# Patient Record
Sex: Male | Born: 1950 | Race: White | Hispanic: No | Marital: Married | Smoking: Former smoker
Health system: Southern US, Community
[De-identification: ages and names within clinical notes are randomized; demographics above are authoritative.]

## PROBLEM LIST (undated history)

## (undated) DIAGNOSIS — Z9889 Other specified postprocedural states: Secondary | ICD-10-CM

## (undated) DIAGNOSIS — E785 Hyperlipidemia, unspecified: Secondary | ICD-10-CM

## (undated) DIAGNOSIS — I10 Essential (primary) hypertension: Secondary | ICD-10-CM

## (undated) DIAGNOSIS — Z87442 Personal history of urinary calculi: Secondary | ICD-10-CM

## (undated) DIAGNOSIS — C439 Malignant melanoma of skin, unspecified: Secondary | ICD-10-CM

## (undated) DIAGNOSIS — J939 Pneumothorax, unspecified: Secondary | ICD-10-CM

## (undated) HISTORY — DX: Hyperlipidemia, unspecified: E78.5

## (undated) HISTORY — DX: Essential (primary) hypertension: I10

## (undated) HISTORY — DX: Pneumothorax, unspecified: J93.9

## (undated) HISTORY — PX: WISDOM TOOTH EXTRACTION: SHX21

---

## 1978-11-27 DIAGNOSIS — J939 Pneumothorax, unspecified: Secondary | ICD-10-CM

## 1978-11-27 HISTORY — DX: Pneumothorax, unspecified: J93.9

## 1979-10-28 DIAGNOSIS — Z9889 Other specified postprocedural states: Secondary | ICD-10-CM

## 1979-10-28 HISTORY — DX: Other specified postprocedural states: Z98.890

## 1998-06-22 ENCOUNTER — Encounter: Admission: RE | Admit: 1998-06-22 | Discharge: 1998-09-20 | Payer: Self-pay | Admitting: Family Medicine

## 2000-09-21 ENCOUNTER — Encounter: Payer: Self-pay | Admitting: Internal Medicine

## 2000-09-21 ENCOUNTER — Encounter: Payer: Self-pay | Admitting: Emergency Medicine

## 2000-09-21 ENCOUNTER — Emergency Department (HOSPITAL_COMMUNITY): Admission: EM | Admit: 2000-09-21 | Discharge: 2000-09-21 | Payer: Self-pay | Admitting: Internal Medicine

## 2017-08-24 DIAGNOSIS — R69 Illness, unspecified: Secondary | ICD-10-CM | POA: Diagnosis not present

## 2017-12-26 DIAGNOSIS — Z0100 Encounter for examination of eyes and vision without abnormal findings: Secondary | ICD-10-CM | POA: Diagnosis not present

## 2017-12-26 DIAGNOSIS — Z0101 Encounter for examination of eyes and vision with abnormal findings: Secondary | ICD-10-CM | POA: Diagnosis not present

## 2018-01-16 DIAGNOSIS — D220 Melanocytic nevi of lip: Secondary | ICD-10-CM | POA: Diagnosis not present

## 2018-01-16 DIAGNOSIS — D1801 Hemangioma of skin and subcutaneous tissue: Secondary | ICD-10-CM | POA: Diagnosis not present

## 2018-01-16 DIAGNOSIS — D485 Neoplasm of uncertain behavior of skin: Secondary | ICD-10-CM | POA: Diagnosis not present

## 2018-01-16 DIAGNOSIS — C4359 Malignant melanoma of other part of trunk: Secondary | ICD-10-CM | POA: Diagnosis not present

## 2018-01-16 DIAGNOSIS — L570 Actinic keratosis: Secondary | ICD-10-CM | POA: Diagnosis not present

## 2018-01-16 DIAGNOSIS — L821 Other seborrheic keratosis: Secondary | ICD-10-CM | POA: Diagnosis not present

## 2018-01-16 DIAGNOSIS — D225 Melanocytic nevi of trunk: Secondary | ICD-10-CM | POA: Diagnosis not present

## 2018-01-25 DIAGNOSIS — C439 Malignant melanoma of skin, unspecified: Secondary | ICD-10-CM | POA: Insufficient documentation

## 2018-01-25 HISTORY — DX: Malignant melanoma of skin, unspecified: C43.9

## 2018-02-06 ENCOUNTER — Ambulatory Visit: Payer: Self-pay | Admitting: General Surgery

## 2018-02-06 ENCOUNTER — Encounter (HOSPITAL_COMMUNITY): Payer: Self-pay | Admitting: *Deleted

## 2018-02-06 ENCOUNTER — Other Ambulatory Visit: Payer: Self-pay

## 2018-02-06 DIAGNOSIS — C4359 Malignant melanoma of other part of trunk: Secondary | ICD-10-CM | POA: Diagnosis not present

## 2018-02-06 NOTE — Progress Notes (Signed)
Pt denies SOB, chest pain, and being under the care of a cardiologist. Pt denies having a stress test, echo and cardiac cath. Pt denies having an EKG and chest x ray within the last year. Pt denies recent labs. Pt made aware to stop taking  Aspirin, vitamins, fish oil and herbal medications. Do not take any NSAIDs ie: Ibuprofen, Advil, Naproxen (Aleve), Motrin, BC and Goody Powder. Pt verbalized understanding of all pre-op instructions. 

## 2018-02-07 NOTE — Anesthesia Preprocedure Evaluation (Signed)
Anesthesia Evaluation  Patient identified by MRN, date of birth, ID band Patient awake    Reviewed: Allergy & Precautions, NPO status , Patient's Chart, lab work & pertinent test results  Airway Mallampati: II  TM Distance: >3 FB Neck ROM: Full    Dental no notable dental hx.    Pulmonary neg pulmonary ROS, former smoker,    Pulmonary exam normal breath sounds clear to auscultation       Cardiovascular negative cardio ROS Normal cardiovascular exam Rhythm:Regular Rate:Normal     Neuro/Psych negative neurological ROS  negative psych ROS   GI/Hepatic negative GI ROS, Neg liver ROS,   Endo/Other  negative endocrine ROS  Renal/GU negative Renal ROS  negative genitourinary   Musculoskeletal negative musculoskeletal ROS (+)   Abdominal   Peds negative pediatric ROS (+)  Hematology negative hematology ROS (+)   Anesthesia Other Findings   Reproductive/Obstetrics negative OB ROS                             Anesthesia Physical Anesthesia Plan  ASA: II  Anesthesia Plan: General   Post-op Pain Management: GA combined w/ Regional for post-op pain   Induction:   PONV Risk Score and Plan: 2  Airway Management Planned: Oral ETT and LMA  Additional Equipment:   Intra-op Plan:   Post-operative Plan: Extubation in OR  Informed Consent: I have reviewed the patients History and Physical, chart, labs and discussed the procedure including the risks, benefits and alternatives for the proposed anesthesia with the patient or authorized representative who has indicated his/her understanding and acceptance.     Plan Discussed with: Anesthesiologist and CRNA  Anesthesia Plan Comments: (Discussed both nerve block for pain relief post-op and GA; including NV, sore throat, dental injury, and pulmonary complications)        Anesthesia Quick Evaluation

## 2018-02-08 ENCOUNTER — Encounter (HOSPITAL_COMMUNITY): Payer: Self-pay | Admitting: *Deleted

## 2018-02-08 ENCOUNTER — Ambulatory Visit (HOSPITAL_COMMUNITY): Payer: Medicare HMO

## 2018-02-08 ENCOUNTER — Ambulatory Visit (HOSPITAL_COMMUNITY): Payer: Medicare HMO | Admitting: Anesthesiology

## 2018-02-08 ENCOUNTER — Encounter (HOSPITAL_COMMUNITY)
Admission: RE | Disposition: A | Discharge status: Home / Self Care | Payer: Self-pay | Source: Ambulatory Visit | Attending: General Surgery

## 2018-02-08 ENCOUNTER — Ambulatory Visit (HOSPITAL_COMMUNITY)
Admission: RE | Admit: 2018-02-08 | Discharge: 2018-02-08 | Disposition: A | Discharge status: Home / Self Care | Payer: Medicare HMO | Source: Ambulatory Visit | Attending: General Surgery | Admitting: General Surgery

## 2018-02-08 ENCOUNTER — Other Ambulatory Visit: Payer: Self-pay

## 2018-02-08 DIAGNOSIS — C4359 Malignant melanoma of other part of trunk: Secondary | ICD-10-CM | POA: Insufficient documentation

## 2018-02-08 DIAGNOSIS — G8918 Other acute postprocedural pain: Secondary | ICD-10-CM | POA: Diagnosis not present

## 2018-02-08 DIAGNOSIS — Z87891 Personal history of nicotine dependence: Secondary | ICD-10-CM | POA: Insufficient documentation

## 2018-02-08 HISTORY — DX: Personal history of urinary calculi: Z87.442

## 2018-02-08 HISTORY — DX: Other specified postprocedural states: Z98.890

## 2018-02-08 HISTORY — PX: MELANOMA EXCISION WITH SENTINEL LYMPH NODE BIOPSY: SHX5267

## 2018-02-08 HISTORY — DX: Malignant melanoma of skin, unspecified: C43.9

## 2018-02-08 LAB — CBC
HCT: 49.1 % (ref 39.0–52.0)
Hemoglobin: 16.9 g/dL (ref 13.0–17.0)
MCH: 30.6 pg (ref 26.0–34.0)
MCHC: 34.4 g/dL (ref 30.0–36.0)
MCV: 88.9 fL (ref 78.0–100.0)
PLATELETS: 190 10*3/uL (ref 150–400)
RBC: 5.52 MIL/uL (ref 4.22–5.81)
RDW: 13.9 % (ref 11.5–15.5)
WBC: 6.3 10*3/uL (ref 4.0–10.5)

## 2018-02-08 SURGERY — MELANOMA EXCISION WITH SENTINEL LYMPH NODE BIOPSY
Anesthesia: General | Site: Breast | Laterality: Left

## 2018-02-08 MED ORDER — MIDAZOLAM HCL 2 MG/2ML IJ SOLN
INTRAMUSCULAR | Status: AC
  Administered 2018-02-08: 2 mg
  Filled 2018-02-08: qty 2

## 2018-02-08 MED ORDER — OXYCODONE HCL 5 MG/5ML PO SOLN: 5.0000 mg | Freq: Once | ORAL | Status: DC | PRN

## 2018-02-08 MED ORDER — CHLORHEXIDINE GLUCONATE CLOTH 2 % EX PADS: 6.0000 | MEDICATED_PAD | Freq: Once | CUTANEOUS | Status: DC

## 2018-02-08 MED ORDER — PROPOFOL 10 MG/ML IV BOLUS
INTRAVENOUS | Status: AC
  Filled 2018-02-08: qty 20

## 2018-02-08 MED ORDER — TECHNETIUM TC 99M SULFUR COLLOID FILTERED
0.5000 | Freq: Once | INTRAVENOUS | Status: AC | PRN
  Administered 2018-02-08: 0.5 via INTRADERMAL

## 2018-02-08 MED ORDER — MIDAZOLAM HCL 2 MG/2ML IJ SOLN
2.0000 mg | Freq: Once | INTRAMUSCULAR | Status: DC
  Filled 2018-02-08: qty 2

## 2018-02-08 MED ORDER — PHENYLEPHRINE 40 MCG/ML (10ML) SYRINGE FOR IV PUSH (FOR BLOOD PRESSURE SUPPORT)
PREFILLED_SYRINGE | INTRAVENOUS | Status: DC | PRN
  Administered 2018-02-08 (×9): 80 ug via INTRAVENOUS

## 2018-02-08 MED ORDER — SODIUM CHLORIDE 0.9 % IJ SOLN
INTRAMUSCULAR | Status: AC
  Filled 2018-02-08: qty 10

## 2018-02-08 MED ORDER — ONDANSETRON HCL 4 MG/2ML IJ SOLN: 4.0000 mg | Freq: Once | INTRAMUSCULAR | Status: DC | PRN

## 2018-02-08 MED ORDER — FENTANYL CITRATE (PF) 250 MCG/5ML IJ SOLN
INTRAMUSCULAR | Status: AC
  Filled 2018-02-08: qty 5

## 2018-02-08 MED ORDER — OXYCODONE HCL 5 MG PO TABS: 5.0000 mg | ORAL_TABLET | Freq: Four times a day (QID) | ORAL | 0 refills | Status: DC | PRN

## 2018-02-08 MED ORDER — SODIUM CHLORIDE 0.9 % IJ SOLN
INTRAMUSCULAR | Status: DC | PRN
  Administered 2018-02-08: 3 mL via INTRAVENOUS

## 2018-02-08 MED ORDER — FENTANYL CITRATE (PF) 100 MCG/2ML IJ SOLN
100.0000 ug | Freq: Once | INTRAMUSCULAR | Status: DC
  Filled 2018-02-08: qty 2

## 2018-02-08 MED ORDER — ONDANSETRON HCL 4 MG/2ML IJ SOLN
INTRAMUSCULAR | Status: DC | PRN
  Administered 2018-02-08: 4 mg via INTRAVENOUS

## 2018-02-08 MED ORDER — ACETAMINOPHEN 160 MG/5ML PO SOLN: 325.0000 mg | ORAL | Status: DC | PRN

## 2018-02-08 MED ORDER — PROPOFOL 10 MG/ML IV BOLUS
INTRAVENOUS | Status: DC | PRN
  Administered 2018-02-08: 150 mg via INTRAVENOUS

## 2018-02-08 MED ORDER — DEXAMETHASONE SODIUM PHOSPHATE 10 MG/ML IJ SOLN
INTRAMUSCULAR | Status: DC | PRN
  Administered 2018-02-08: 10 mg via INTRAVENOUS

## 2018-02-08 MED ORDER — PHENYLEPHRINE HCL 10 MG/ML IJ SOLN: INTRAVENOUS | Status: DC | PRN

## 2018-02-08 MED ORDER — KETOROLAC TROMETHAMINE 30 MG/ML IJ SOLN: 30.0000 mg | Freq: Once | INTRAMUSCULAR | Status: DC | PRN

## 2018-02-08 MED ORDER — ACETAMINOPHEN 325 MG PO TABS: 325.0000 mg | ORAL_TABLET | ORAL | Status: DC | PRN

## 2018-02-08 MED ORDER — LIDOCAINE HCL (CARDIAC) 20 MG/ML IV SOLN
INTRAVENOUS | Status: AC
  Filled 2018-02-08: qty 5

## 2018-02-08 MED ORDER — ROCURONIUM BROMIDE 10 MG/ML (PF) SYRINGE
PREFILLED_SYRINGE | INTRAVENOUS | Status: AC
  Filled 2018-02-08: qty 5

## 2018-02-08 MED ORDER — MIDAZOLAM HCL 2 MG/2ML IJ SOLN
INTRAMUSCULAR | Status: AC
  Filled 2018-02-08: qty 2

## 2018-02-08 MED ORDER — LACTATED RINGERS IV SOLN
INTRAVENOUS | Status: DC
  Administered 2018-02-08: 09:00:00 via INTRAVENOUS

## 2018-02-08 MED ORDER — FENTANYL CITRATE (PF) 100 MCG/2ML IJ SOLN
INTRAMUSCULAR | Status: AC
  Administered 2018-02-08: 100 ug
  Filled 2018-02-08: qty 2

## 2018-02-08 MED ORDER — OXYCODONE HCL 5 MG PO TABS: 5.0000 mg | ORAL_TABLET | Freq: Once | ORAL | Status: DC | PRN

## 2018-02-08 MED ORDER — METHYLENE BLUE 0.5 % INJ SOLN
INTRAVENOUS | Status: DC | PRN
  Administered 2018-02-08: 2 mL via SUBMUCOSAL

## 2018-02-08 MED ORDER — METHYLENE BLUE 0.5 % INJ SOLN
INTRAVENOUS | Status: AC
  Filled 2018-02-08: qty 10

## 2018-02-08 MED ORDER — BUPIVACAINE HCL (PF) 0.75 % IJ SOLN
INTRAMUSCULAR | Status: DC | PRN
  Administered 2018-02-08: 30 mL via PERINEURAL

## 2018-02-08 MED ORDER — BUPIVACAINE-EPINEPHRINE (PF) 0.25% -1:200000 IJ SOLN
INTRAMUSCULAR | Status: AC
  Filled 2018-02-08: qty 30

## 2018-02-08 MED ORDER — PHENYLEPHRINE 40 MCG/ML (10ML) SYRINGE FOR IV PUSH (FOR BLOOD PRESSURE SUPPORT)
PREFILLED_SYRINGE | INTRAVENOUS | Status: AC
  Filled 2018-02-08: qty 10

## 2018-02-08 MED ORDER — ONDANSETRON HCL 4 MG/2ML IJ SOLN
INTRAMUSCULAR | Status: AC
  Filled 2018-02-08: qty 2

## 2018-02-08 MED ORDER — BUPIVACAINE-EPINEPHRINE 0.25% -1:200000 IJ SOLN
INTRAMUSCULAR | Status: DC | PRN
  Administered 2018-02-08: 15 mL

## 2018-02-08 MED ORDER — DEXAMETHASONE SODIUM PHOSPHATE 10 MG/ML IJ SOLN
INTRAMUSCULAR | Status: AC
  Filled 2018-02-08: qty 1

## 2018-02-08 MED ORDER — 0.9 % SODIUM CHLORIDE (POUR BTL) OPTIME
TOPICAL | Status: DC | PRN
  Administered 2018-02-08: 1000 mL

## 2018-02-08 MED ORDER — EPHEDRINE SULFATE-NACL 50-0.9 MG/10ML-% IV SOSY
PREFILLED_SYRINGE | INTRAVENOUS | Status: DC | PRN
  Administered 2018-02-08 (×3): 5 mg via INTRAVENOUS

## 2018-02-08 MED ORDER — EPHEDRINE 5 MG/ML INJ
INTRAVENOUS | Status: AC
  Filled 2018-02-08: qty 10

## 2018-02-08 MED ORDER — MEPERIDINE HCL 50 MG/ML IJ SOLN: 6.2500 mg | INTRAMUSCULAR | Status: DC | PRN

## 2018-02-08 MED ORDER — LIDOCAINE 2% (20 MG/ML) 5 ML SYRINGE
INTRAMUSCULAR | Status: DC | PRN
  Administered 2018-02-08: 100 mg via INTRAVENOUS

## 2018-02-08 MED ORDER — FENTANYL CITRATE (PF) 100 MCG/2ML IJ SOLN: 25.0000 ug | INTRAMUSCULAR | Status: DC | PRN

## 2018-02-08 MED ORDER — CEFAZOLIN SODIUM-DEXTROSE 2-4 GM/100ML-% IV SOLN
2.0000 g | INTRAVENOUS | Status: AC
  Administered 2018-02-08: 2 g via INTRAVENOUS
  Filled 2018-02-08: qty 100

## 2018-02-08 SURGICAL SUPPLY — 58 items
ADH SKN CLS APL DERMABOND .7 (GAUZE/BANDAGES/DRESSINGS) ×1
APL SKNCLS STERI-STRIP NONHPOA (GAUZE/BANDAGES/DRESSINGS) ×1
APPLIER CLIP 11 MED OPEN (CLIP)
APR CLP MED 11 20 MLT OPN (CLIP)
BENZOIN TINCTURE PRP APPL 2/3 (GAUZE/BANDAGES/DRESSINGS) ×2 IMPLANT
BLADE CLIPPER SURG (BLADE) ×1 IMPLANT
CANISTER SUCT 3000ML PPV (MISCELLANEOUS) ×2 IMPLANT
CHLORAPREP W/TINT 26ML (MISCELLANEOUS) ×2 IMPLANT
CLIP APPLIE 11 MED OPEN (CLIP) IMPLANT
CLIP TI WIDE RED SMALL 6 (CLIP) ×1 IMPLANT
CLIP VESOCCLUDE MED 6/CT (CLIP) ×1 IMPLANT
CLIP VESOCCLUDE SM WIDE 6/CT (CLIP) IMPLANT
CONT SPEC 4OZ CLIKSEAL STRL BL (MISCELLANEOUS) IMPLANT
COVER PROBE W GEL 5X96 (DRAPES) IMPLANT
COVER SURGICAL LIGHT HANDLE (MISCELLANEOUS) ×2 IMPLANT
DERMABOND ADVANCED (GAUZE/BANDAGES/DRESSINGS) ×1
DERMABOND ADVANCED .7 DNX12 (GAUZE/BANDAGES/DRESSINGS) IMPLANT
DRAPE LAPAROSCOPIC ABDOMINAL (DRAPES) ×2 IMPLANT
DRAPE UTILITY XL STRL (DRAPES) ×3 IMPLANT
DRSG TEGADERM 4X4.75 (GAUZE/BANDAGES/DRESSINGS) ×2 IMPLANT
ELECT CAUTERY BLADE 6.4 (BLADE) ×2 IMPLANT
ELECT REM PT RETURN 9FT ADLT (ELECTROSURGICAL) ×2
ELECTRODE REM PT RTRN 9FT ADLT (ELECTROSURGICAL) ×1 IMPLANT
GAUZE SPONGE 4X4 12PLY STRL (GAUZE/BANDAGES/DRESSINGS) ×2 IMPLANT
GAUZE SPONGE 4X4 12PLY STRL LF (GAUZE/BANDAGES/DRESSINGS) ×1 IMPLANT
GLOVE BIO SURGEON STRL SZ8 (GLOVE) ×3 IMPLANT
GLOVE BIOGEL PI IND STRL 7.0 (GLOVE) IMPLANT
GLOVE BIOGEL PI IND STRL 8 (GLOVE) ×1 IMPLANT
GLOVE BIOGEL PI INDICATOR 7.0 (GLOVE) ×4
GLOVE BIOGEL PI INDICATOR 8 (GLOVE) ×1
GLOVE ECLIPSE 7.0 STRL STRAW (GLOVE) ×1 IMPLANT
GOWN STRL REUS W/ TWL LRG LVL3 (GOWN DISPOSABLE) ×2 IMPLANT
GOWN STRL REUS W/ TWL XL LVL3 (GOWN DISPOSABLE) ×1 IMPLANT
GOWN STRL REUS W/TWL LRG LVL3 (GOWN DISPOSABLE) ×4
GOWN STRL REUS W/TWL XL LVL3 (GOWN DISPOSABLE) ×2
KIT BASIN OR (CUSTOM PROCEDURE TRAY) ×2 IMPLANT
KIT ROOM TURNOVER OR (KITS) ×2 IMPLANT
NDL 18GX1X1/2 (RX/OR ONLY) (NEEDLE) ×1 IMPLANT
NDL FILTER BLUNT 18X1 1/2 (NEEDLE) IMPLANT
NEEDLE 18GX1X1/2 (RX/OR ONLY) (NEEDLE) ×2 IMPLANT
NEEDLE 22X1 1/2 (OR ONLY) (NEEDLE) ×4 IMPLANT
NEEDLE FILTER BLUNT 18X 1/2SAF (NEEDLE)
NEEDLE FILTER BLUNT 18X1 1/2 (NEEDLE) IMPLANT
NS IRRIG 1000ML POUR BTL (IV SOLUTION) ×2 IMPLANT
PACK GENERAL/GYN (CUSTOM PROCEDURE TRAY) ×2 IMPLANT
PAD ARMBOARD 7.5X6 YLW CONV (MISCELLANEOUS) ×4 IMPLANT
STAPLER VISISTAT 35W (STAPLE) ×2 IMPLANT
STRIP CLOSURE SKIN 1/2X4 (GAUZE/BANDAGES/DRESSINGS) ×2 IMPLANT
SUT ETHILON 3 0 FSL (SUTURE) ×3 IMPLANT
SUT MON AB 4-0 PC3 18 (SUTURE) ×3 IMPLANT
SUT SILK 2 0 PERMA HAND 18 BK (SUTURE) IMPLANT
SUT SILK 2 0 SH (SUTURE) IMPLANT
SUT VIC AB 3-0 SH 27 (SUTURE) ×2
SUT VIC AB 3-0 SH 27XBRD (SUTURE) ×1 IMPLANT
SUT VIC AB 3-0 SH 8-18 (SUTURE) ×1 IMPLANT
SYR CONTROL 10ML LL (SYRINGE) ×3 IMPLANT
TOWEL OR 17X24 6PK STRL BLUE (TOWEL DISPOSABLE) ×2 IMPLANT
TOWEL OR 17X26 10 PK STRL BLUE (TOWEL DISPOSABLE) ×2 IMPLANT

## 2018-02-08 NOTE — Anesthesia Procedure Notes (Signed)
Procedure Name: LMA Insertion Date/Time: 02/08/2018 11:53 AM Performed by: Gwyndolyn Saxon, CRNA Pre-anesthesia Checklist: Patient identified, Emergency Drugs available, Suction available, Patient being monitored and Timeout performed Patient Re-evaluated:Patient Re-evaluated prior to induction Oxygen Delivery Method: Circle system utilized Preoxygenation: Pre-oxygenation with 100% oxygen Induction Type: IV induction Ventilation: Mask ventilation without difficulty LMA: LMA inserted LMA Size: 4.0 Number of attempts: 1 Placement Confirmation: positive ETCO2,  CO2 detector and breath sounds checked- equal and bilateral Tube secured with: Tape Dental Injury: Teeth and Oropharynx as per pre-operative assessment

## 2018-02-08 NOTE — Interval H&P Note (Signed)
History and Physical Interval Note:  02/08/2018 10:44 AM  Robert Mann  has presented today for surgery, with the diagnosis of melanoma left chest  The various methods of treatment have been discussed with the patient and family. After consideration of risks, benefits and other options for treatment, the patient has consented to  Procedure(s): WIDE EXCISION OF LEFT CHEST MELANOMA AND LEFT AXILLARY SENTINEL LYMPH NODE (Left) as a surgical intervention .  The patient's history has been reviewed, patient examined, no change in status, stable for surgery.  I have reviewed the patient's chart and labs.  Questions were answered to the patient's satisfaction.     Zenovia Jarred

## 2018-02-08 NOTE — Anesthesia Procedure Notes (Addendum)
Anesthesia Regional Block: Pectoralis block   Pre-Anesthetic Checklist: ,, timeout performed, Correct Patient, Correct Site, Correct Laterality, Correct Procedure, Correct Position, site marked, Risks and benefits discussed,  Surgical consent,  Pre-op evaluation,  At surgeon's request and post-op pain management  Laterality: Left  Prep: chloraprep       Needles:  Injection technique: Single-shot  Needle Type: Echogenic Stimulator Needle     Needle Length: 5cm  Needle Gauge: 22     Additional Needles:   Procedures:, nerve stimulator,,, ultrasound used (permanent image in chart),,,,  Narrative:  Start time: 02/08/2018 10:12 AM End time: 02/08/2018 10:17 AM Injection made incrementally with aspirations every 5 mL.  Performed by: Personally  Anesthesiologist: Janeece Riggers, MD  Additional Notes: Functioning IV was confirmed and monitors were applied.  A 6mm 22ga Arrow echogenic stimulator needle was used. Sterile prep and drape,hand hygiene and sterile gloves were used. Ultrasound guidance: relevant anatomy identified, needle position confirmed, local anesthetic spread visualized around nerve(s)., vascular puncture avoided.  Image printed for medical record. Negative aspiration and negative test dose prior to incremental administration of local anesthetic. The patient tolerated the procedure well.

## 2018-02-08 NOTE — Anesthesia Postprocedure Evaluation (Signed)
Anesthesia Post Note  Patient: Robert Mann  Procedure(s) Performed: WIDE EXCISION OF LEFT CHEST MELANOMA AND LEFT AXILLARY SENTINEL LYMPH NODE (Left Breast)     Patient location during evaluation: PACU Anesthesia Type: General Level of consciousness: awake and alert Pain management: pain level controlled Vital Signs Assessment: post-procedure vital signs reviewed and stable Respiratory status: spontaneous breathing, nonlabored ventilation, respiratory function stable and patient connected to nasal cannula oxygen Cardiovascular status: blood pressure returned to baseline and stable Postop Assessment: no apparent nausea or vomiting Anesthetic complications: no    Last Vitals:  Vitals:   02/08/18 1328 02/08/18 1330  BP: 114/77   Pulse: 82 81  Resp: 13 14  Temp:    SpO2: 94% 96%    Last Pain:  Vitals:   02/08/18 1319  TempSrc:   PainSc: Asleep                 Travia Onstad

## 2018-02-08 NOTE — Transfer of Care (Signed)
Immediate Anesthesia Transfer of Care Note  Patient: Robert Mann  Procedure(s) Performed: WIDE EXCISION OF LEFT CHEST MELANOMA AND LEFT AXILLARY SENTINEL LYMPH NODE (Left Breast)  Patient Location: PACU  Anesthesia Type:General  Level of Consciousness: drowsy  Airway & Oxygen Therapy: Patient Spontanous Breathing and Patient connected to face mask oxygen  Post-op Assessment: Report given to RN and Post -op Vital signs reviewed and stable  Post vital signs: Reviewed and stable  Last Vitals:  Vitals:   02/08/18 1105 02/08/18 1110  BP: (!) 136/94   Pulse: 76 73  Resp: 13 12  Temp:    SpO2: 96% 97%    Last Pain:  Vitals:   02/08/18 0849  TempSrc: Oral      Patients Stated Pain Goal: 8 (37/85/88 5027)  Complications: No apparent anesthesia complications

## 2018-02-08 NOTE — Op Note (Signed)
02/08/2018  1:00 PM  PATIENT:  Robert Mann  67 y.o. male  PRE-OPERATIVE DIAGNOSIS:  melanoma left chest  POST-OPERATIVE DIAGNOSIS:  melanoma left chest  PROCEDURE:  Procedure(s): WIDE EXCISION OF LEFT CHEST MELANOMA 15CM X 6CM WITH LAYERED CLOSURE LEFT AXILLARY SENTINEL LYMPH NODE BIOPSY WITH BLUE DYE INJECTION  SURGEON:  Surgeon(s): Georganna Skeans, MD  ASSISTANTS: none   ANESTHESIA:   local and general  EBL:  Total I/O In: -  Out: 25 [Blood:25]  BLOOD ADMINISTERED:none  DRAINS: none   SPECIMEN:  Excision  DISPOSITION OF SPECIMEN:  PATHOLOGY  COUNTS:  YES  DICTATION: .Dragon Dictation Findings: 1 hot blue lymph node left axilla  Procedure in detail: Robert Mann presents for left axillary lymph node biopsy and wide excision melanoma left chest wall.  He was identified in the preop holding area.  His site was marked.  He was injected with radionucleotide.  Informed consent was obtained.  He received intravenous antibiotics.  He was brought to the operating room and general anesthesia with laryngeal mask airway was administered by the anesthesia staff.  We did a timeout procedure.  Blue dye was injected subcutaneously in 4 quadrants around the melanoma.  The area was incised.  His chest and left axilla were then prepped and draped in a sterile fashion.  I used the neoprobe to find a location in his mid axilla with signal.  Local was injected and a transverse incision was made.  Subtenons tissues were dissected out into the axillary fat pad.  Further dissection, guided by neoprobe, located 1 blue hot node.  It was circumferentially dissected and some small clips were placed on the lymphatics and vascular structures leading to it.  We stayed central away from the long thoracic and thoracodorsal nerve regions.  The lymph node was removed.  There was nothing else with any significant signal in the axilla.  The wound was irrigated.  Hemostasis was insured.  Deep tissues were  approximated with interrupted 3-0 Vicryl and the skin was closed with running 4-0 Monocryl subcuticular followed by Dermabond.  Attention was directed to the melanoma.  I did an elliptical incision along the tissue plane lines to facilitate closure and achieved greater than 2 cm circumferential margin around the melanoma.  This was 15 x 6 cm.  Subcutaneous tissues were dissected down to the underlying fascia and the excision was done completely.  It was marked for pathology and sent.  Hemostasis was obtained.  The wound was irrigated. I raised subcutaneous flaps superiorly and inferiorly.  The wound was then closed in layers.  Deep tissues were approximated with erupted 3-0 Vicryl.  The skin was closed with interrupted 3-0 nylon.  It came together nicely without significant tension.  A bulky sterile dressing was applied.  He tolerated the procedure well without apparent complication.  All counts were correct.  He was taken recovery in stable condition. PATIENT DISPOSITION:  PACU - hemodynamically stable.   Delay start of Pharmacological VTE agent (>24hrs) due to surgical blood loss or risk of bleeding:  no  Georganna Skeans, MD, MPH, FACS Pager: [phone]  3/15/20191:00 PM

## 2018-02-08 NOTE — H&P (Signed)
Robert Mann Documented: 02/06/2018 9:57 AM Location: [facility] Patient #: [id number] DOB: 02-18-1951 Married / Language: English / Race: White Male   History of Present Illness Robert Mann E. Grandville Silos MD; 02/06/2018 10:13 AM) The patient is a 67 year old male who presents for a melanoma biopsy follow-up. Dr. Wilhemina Bonito asked me to see Robert Mann regarding a left chest melanoma. The patient had a nevus in that location for many years. Recently, it changed color and he was evaluated by Dr. Ronnald Mann. Punch biopsy revealed superficial spreading melanoma, 1 mm in thickness with positive peripheral margins but negative deep margin. He is not having any significant discomfort in the area. He is understandably somewhat anxious regarding the diagnosis. No other history of melanoma. He has had several seborrheic keratoses treated by Dr. Ronnald Mann.   Past Surgical History Sharyn Lull R. Brooks, CMA; 02/06/2018 9:58 AM) No pertinent past surgical history   Diagnostic Studies History Sharyn Lull R. Brooks, CMA; 02/06/2018 9:58 AM) Colonoscopy  never  Allergies Sharyn Lull R. Brooks, CMA; 02/06/2018 9:58 AM) No Known Drug Allergies [02/06/2018]:  Medication History Sharyn Lull R. Brooks, CMA; 02/06/2018 9:58 AM) No Current Medications Medications Reconciled  Social History Sharyn Lull R. Brooks, CMA; 02/06/2018 9:58 AM) Alcohol use  Occasional alcohol use. Caffeine use  Coffee. Illicit drug use  Remotely quit drug use. Tobacco use  Former smoker.  Family History Sharyn Lull R. Rolena Infante, CMA; 02/06/2018 9:58 AM) Heart Disease  Father, Mother. Hypertension  Father, Mother. Migraine Headache  Daughter.  Other Problems Sharyn Lull R. Brooks, CMA; 02/06/2018 9:58 AM) Melanoma     Review of Systems [facility] R. Brooks CMA; 02/06/2018 9:58 AM) General Not Present- Appetite Loss, Chills, Fatigue, Fever, Night Sweats, Weight Gain and Weight Loss. Skin Present- Change in Wart/Mole. Not  Present- Dryness, Hives, Jaundice, New Lesions, Non-Healing Wounds, Rash and Ulcer. HEENT Present- Wears glasses/contact lenses. Not Present- Earache, Hearing Loss, Hoarseness, Nose Bleed, Oral Ulcers, Ringing in the Ears, Seasonal Allergies, Sinus Pain, Sore Throat, Visual Disturbances and Yellow Eyes. Respiratory Present- Snoring. Not Present- Bloody sputum, Chronic Cough, Difficulty Breathing and Wheezing. Breast Not Present- Breast Mass, Breast Pain, Nipple Discharge and Skin Changes. Cardiovascular Not Present- Chest Pain, Difficulty Breathing Lying Down, Leg Cramps, Palpitations, Rapid Heart Rate, Shortness of Breath and Swelling of Extremities. Gastrointestinal Not Present- Abdominal Pain, Bloating, Bloody Stool, Change in Bowel Habits, Chronic diarrhea, Constipation, Difficulty Swallowing, Excessive gas, Gets full quickly at meals, Hemorrhoids, Indigestion, Nausea, Rectal Pain and Vomiting. Male Genitourinary Not Present- Blood in Urine, Change in Urinary Stream, Frequency, Impotence, Nocturia, Painful Urination, Urgency and Urine Leakage. Musculoskeletal Not Present- Back Pain, Joint Pain, Joint Stiffness, Muscle Pain, Muscle Weakness and Swelling of Extremities. Neurological Not Present- Decreased Memory, Fainting, Headaches, Numbness, Seizures, Tingling, Tremor, Trouble walking and Weakness. Psychiatric Not Present- Anxiety, Bipolar, Change in Sleep Pattern, Depression, Fearful and Frequent crying. Endocrine Not Present- Cold Intolerance, Excessive Hunger, Hair Changes, Heat Intolerance, Hot flashes and New Diabetes. Hematology Not Present- Blood Thinners, Easy Bruising, Excessive bleeding, Gland problems, HIV and Persistent Infections.  Vitals [employer] R. Brooks CMA; 02/06/2018 9:58 AM) 02/06/2018 9:58 AM Weight: 170 lb Height: 71in Body Surface Area: 1.97 m Body Mass Index: 23.71 kg/m  Pulse: 98 (Regular)  BP: 182/100 (Sitting, Left Arm, Standard)       Physical Exam  (Brighten Orndoff E. Grandville Silos MD; 02/06/2018 10:12 AM) General Note: No distress   Integumentary Note: Warm and dry, biopsy site left chest wall over pectoralis, remaining nevus is less than 2 cm in  size, no surrounding nodularity   ENMT Note: Ears normal externally, oral mucosa moist   Chest and Lung Exam Note: Clear to auscultation bilaterally, no wheeze   Cardiovascular Note: Regular rate and rhythm, peripheral pulses intact, no significant peripheral edema   Abdomen Note: Soft, nontender, nondistended   Musculoskeletal Note: No deformity or tenderness, good range of motion   Lymphatic Note: palpable cervical, supraclavicular, bilateral axillary, or bilateral inguinal lymphadenopathy     Assessment & Plan Robert Mann E. 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 I have offered wide excision of the melanoma with left axillary sentinel lymph node biopsy. I discussed the procedure, risks, and benefits with him. He is anxious to schedule surgery and we will do this as soon as possible. He is agreeable.  Georganna Skeans, MD, MPH, FACS Trauma: [phone] General Surgery: [phone]

## 2018-02-09 ENCOUNTER — Encounter (HOSPITAL_COMMUNITY): Payer: Self-pay | Admitting: General Surgery

## 2018-03-08 ENCOUNTER — Telehealth: Payer: Self-pay | Admitting: Hematology

## 2018-03-08 NOTE — Telephone Encounter (Signed)
Called patient and informed him of appointment date/time/location/phone number.  Also called referring office with appt info

## 2018-03-27 ENCOUNTER — Inpatient Hospital Stay: Payer: Medicare HMO | Attending: Oncology | Admitting: Oncology

## 2018-03-27 VITALS — BP 143/104 | HR 97 | Temp 98.5°F | Resp 18 | Ht 71.5 in | Wt 170.8 lb

## 2018-03-27 DIAGNOSIS — C439 Malignant melanoma of skin, unspecified: Secondary | ICD-10-CM | POA: Insufficient documentation

## 2018-03-27 DIAGNOSIS — Z87891 Personal history of nicotine dependence: Secondary | ICD-10-CM | POA: Diagnosis not present

## 2018-03-27 NOTE — Progress Notes (Signed)
Reason for Referral: Melanoma  HPI: 67 year old gentleman currently of [place] but have spent a considerable amount of time in [place].  He is a rather healthy gentleman without any significant comorbid conditions started noticing a mole on his left chest wall.  That mole has been there for many years but started changing color and was evaluated by Dr. Ronnald Ramp at that time.  A punch biopsy performed at that time showed a superficial spreading melanoma with minimal millimeter thickness and positive margins.  Based on these findings he was evaluated by Dr. Grandville Silos and subsequently underwent wide local excision of the left chest wall on 02/08/2018 in addition to sentinel lymph node biopsy.  The final pathology revealed a 1.1 mm total depth with negative margins.  No ulceration or satellitosis noted.  His tumor infiltrating lymphocyte was nonbrisk.  The final pathological staging was T2aN0.  Clinically, he has no other complaints at this time.  He has recovered well from his operation.  He denies any personal or family history of melanoma at this time.  He spends considerable amount of time outdoors including playing golf.  He does not report any headaches, blurry vision, syncope or seizures. Does not report any fevers, chills or sweats.  Does not report any cough, wheezing or hemoptysis.  Does not report any chest pain, palpitation, orthopnea or leg edema.  Does not report any nausea, vomiting or abdominal pain.  Does not report any constipation or diarrhea.  Does not report any skeletal complaints.    Does not report frequency, urgency or hematuria.  Does not report any skin rashes or lesions. Does not report any heat or cold intolerance.  Does not report any lymphadenopathy or petechiae.  Does not report any anxiety or depression.  Remaining review of systems is negative.    Past Medical History:  Diagnosis Date  . History of kidney stones   . History of placement of chest tube    36 yrs. ago  .  Melanoma ([place])    left chest  :  Past Surgical History:  Procedure Laterality Date  . MELANOMA EXCISION WITH SENTINEL LYMPH NODE BIOPSY Left 02/08/2018   Procedure: WIDE EXCISION OF LEFT CHEST MELANOMA AND LEFT AXILLARY SENTINEL LYMPH NODE;  Surgeon: Georganna Skeans, MD;  Location: [facility];  Service: General;  Laterality: Left;  . MOUTH SURGERY    :   Current Outpatient Medications:  .  naproxen sodium (ALEVE) 220 MG tablet, Take 220 mg by mouth daily as needed (for pain or headache)., Disp: , Rfl:  .  Omega-3 Fatty Acids (FISH OIL PO), Take 1 capsule by mouth daily., Disp: , Rfl:  .  oxyCODONE (OXY IR/ROXICODONE) 5 MG immediate release tablet, Take 1 tablet (5 mg total) by mouth every 6 (six) hours as needed for severe pain., Disp: 25 tablet, Rfl: 0:  No Known Allergies:  Family History  Problem Relation Age of Onset  . Hypertension Mother   . Hypertension Father   :  Social History   Socioeconomic History  . Marital status: Married    Spouse name: Not on file  . Number of children: Not on file  . Years of education: Not on file  . Highest education level: Not on file  Occupational History  . Not on file  Social Needs  . Financial resource strain: Not on file  . Food insecurity:    Worry: Not on file    Inability: Not on file  . Transportation needs:    Medical: Not  on file    Non-medical: Not on file  Tobacco Use  . Smoking status: Former Research scientist (life sciences)  . Smokeless tobacco: Never Used  . Tobacco comment: Quit smoking cigarettes in 1981  Substance and Sexual Activity  . Alcohol use: Yes    Comment: social   . Drug use: No  . Sexual activity: Not on file  Lifestyle  . Physical activity:    Days per week: Not on file    Minutes per session: Not on file  . Stress: Not on file  Relationships  . Social connections:    Talks on phone: Not on file    Gets together: Not on file    Attends religious service: Not on file    Active member of club or organization: Not on file     Attends meetings of clubs or organizations: Not on file    Relationship status: Not on file  . Intimate partner violence:    Fear of current or ex partner: Not on file    Emotionally abused: Not on file    Physically abused: Not on file    Forced sexual activity: Not on file  Other Topics Concern  . Not on file  Social History Narrative  . Not on file  :  Pertinent items are noted in HPI.  Exam: Blood pressure (!) 143/104, pulse 97, temperature 98.5 F (36.9 C), temperature source Oral, resp. rate 18, height 5' 11.5" (1.816 m), weight 170 lb 12.8 oz (77.5 kg), SpO2 97 %. ECOG 0  General appearance: alert and cooperative appeared without distress. Head: atraumatic without any abnormalities. Eyes: conjunctivae/corneas clear. PERRL.  Sclera anicteric. Throat: lips, mucosa, and tongue normal; without oral thrush or ulcers. Resp: clear to auscultation bilaterally without rhonchi, wheezes or dullness to percussion. Cardio: regular rate and rhythm, S1, S2 normal, no murmur, click, rub or gallop GI: soft, non-tender; bowel sounds normal; no masses,  no organomegaly Skin: Skin color, texture, turgor normal. No rashes or lesions.  Well-healed scars on the anterior left chest wall. Lymph nodes: Cervical, supraclavicular, and axillary nodes normal. Neurologic: Grossly normal without any motor, sensory or deep tendon reflexes. Musculoskeletal: No joint deformity or effusion.  CBC    Component Value Date/Time   WBC 6.3 02/08/2018 0931   RBC 5.52 02/08/2018 0931   HGB 16.9 02/08/2018 0931   HCT 49.1 02/08/2018 0931   PLT 190 02/08/2018 0931   MCV 88.9 02/08/2018 0931   MCH 30.6 02/08/2018 0931   MCHC 34.4 02/08/2018 0931   RDW 13.9 02/08/2018 0931     Chemistry   No results found for: NA, K, CL, CO2, BUN, CREATININE, GLU No results found for: CALCIUM, ALKPHOS, AST, ALT, BILITOT    No results found.  Assessment and Plan:   67 year old gentleman with the following  issues:  1.  Superficial spreading melanoma diagnosed in March 2019.  His final pathological staging is T2aN0 after localized excision and sentinel lymph node sampling completed in March 2019.  He is tumor started with a mole on the left chest wall.  He is tumors measuring 1.1 mm without any ulceration.  The natural course of this disease was discussed today as well as his risk of recurrence.  He remains in early stage disease and does not require any additional adjuvant therapy at this time.  I recommended observation and surveillance and follow-up periodically regarding this issue.  He has has follow-up established with Dr. Wilhemina Bonito for dermatology surveillance.  2.  Skin protection: We  have discussed strategies to avoid skin damage and excessive skin exposure including long sleeves and sunblock as well as hats.  The concern is developing subsequent melanomas as his original melanoma is likely cured with surgery at this time.  3.  Follow-up: I am happy to see him in the future as needed.  30  minutes was spent with the patient face-to-face today.  More than 50% of time was dedicated to patient counseling, education and coordination of his care.

## 2018-03-28 ENCOUNTER — Telehealth: Payer: Self-pay

## 2018-03-28 NOTE — Telephone Encounter (Signed)
Per 5/1 no los °

## 2018-05-08 DIAGNOSIS — L570 Actinic keratosis: Secondary | ICD-10-CM | POA: Diagnosis not present

## 2018-05-08 DIAGNOSIS — D2271 Melanocytic nevi of right lower limb, including hip: Secondary | ICD-10-CM | POA: Diagnosis not present

## 2018-05-08 DIAGNOSIS — Z85820 Personal history of malignant melanoma of skin: Secondary | ICD-10-CM | POA: Diagnosis not present

## 2018-05-08 DIAGNOSIS — D225 Melanocytic nevi of trunk: Secondary | ICD-10-CM | POA: Diagnosis not present

## 2018-05-08 DIAGNOSIS — D2272 Melanocytic nevi of left lower limb, including hip: Secondary | ICD-10-CM | POA: Diagnosis not present

## 2018-05-08 DIAGNOSIS — D0472 Carcinoma in situ of skin of left lower limb, including hip: Secondary | ICD-10-CM | POA: Diagnosis not present

## 2018-05-08 DIAGNOSIS — D1801 Hemangioma of skin and subcutaneous tissue: Secondary | ICD-10-CM | POA: Diagnosis not present

## 2018-05-08 DIAGNOSIS — D485 Neoplasm of uncertain behavior of skin: Secondary | ICD-10-CM | POA: Diagnosis not present

## 2018-07-31 ENCOUNTER — Encounter: Payer: Self-pay | Admitting: Adult Health

## 2018-07-31 ENCOUNTER — Ambulatory Visit (INDEPENDENT_AMBULATORY_CARE_PROVIDER_SITE_OTHER): Payer: Medicare HMO | Admitting: Adult Health

## 2018-07-31 VITALS — BP 152/90 | Temp 98.5°F | Wt 172.0 lb

## 2018-07-31 DIAGNOSIS — Z1211 Encounter for screening for malignant neoplasm of colon: Secondary | ICD-10-CM

## 2018-07-31 DIAGNOSIS — Z7689 Persons encountering health services in other specified circumstances: Secondary | ICD-10-CM | POA: Diagnosis not present

## 2018-07-31 DIAGNOSIS — Z23 Encounter for immunization: Secondary | ICD-10-CM

## 2018-07-31 DIAGNOSIS — R4789 Other speech disturbances: Secondary | ICD-10-CM

## 2018-07-31 LAB — CBC WITH DIFFERENTIAL/PLATELET
BASOS ABS: 0.1 10*3/uL (ref 0.0–0.1)
Basophils Relative: 0.8 % (ref 0.0–3.0)
EOS ABS: 0.2 10*3/uL (ref 0.0–0.7)
Eosinophils Relative: 2.8 % (ref 0.0–5.0)
HCT: 49.9 % (ref 39.0–52.0)
Hemoglobin: 16.9 g/dL (ref 13.0–17.0)
LYMPHS PCT: 30.4 % (ref 12.0–46.0)
Lymphs Abs: 2.1 10*3/uL (ref 0.7–4.0)
MCHC: 33.8 g/dL (ref 30.0–36.0)
MCV: 88.5 fl (ref 78.0–100.0)
MONO ABS: 0.8 10*3/uL (ref 0.1–1.0)
Monocytes Relative: 11.5 % (ref 3.0–12.0)
Neutro Abs: 3.8 10*3/uL (ref 1.4–7.7)
Neutrophils Relative %: 54.5 % (ref 43.0–77.0)
Platelets: 205 10*3/uL (ref 150.0–400.0)
RBC: 5.64 Mil/uL (ref 4.22–5.81)
RDW: 14.1 % (ref 11.5–15.5)
WBC: 7.1 10*3/uL (ref 4.0–10.5)

## 2018-07-31 LAB — BASIC METABOLIC PANEL
BUN: 22 mg/dL (ref 6–23)
CHLORIDE: 105 meq/L (ref 96–112)
CO2: 31 mEq/L (ref 19–32)
CREATININE: 1.13 mg/dL (ref 0.40–1.50)
Calcium: 9.3 mg/dL (ref 8.4–10.5)
GFR: 69.01 mL/min (ref >60.00)
Glucose, Bld: 100 mg/dL — ABNORMAL HIGH (ref 70–99)
POTASSIUM: 4.5 meq/L (ref 3.5–5.1)
SODIUM: 142 meq/L (ref 135–145)

## 2018-07-31 LAB — VITAMIN B12: VITAMIN B 12: 405 pg/mL (ref 211–911)

## 2018-07-31 LAB — VITAMIN D 25 HYDROXY (VIT D DEFICIENCY, FRACTURES): VITD: 33.53 ng/mL (ref 30.00–100.00)

## 2018-07-31 NOTE — Progress Notes (Signed)
Patient presents to clinic today to establish care. He is a pleasant 67 year old male who  has a past medical history of High blood pressure, History of kidney stones, History of placement of chest tube, and Melanoma ([place]).  Has not been seen by primary care in "30 years".  Acute Concerns: Establish Care  Word finding issues - reports over the last two years he has been experiencing word finding issues. He does not feel as though his symptoms have worsen over the last two years and feels as though he is getting a little better as of recently.  Denies headaches, difficulty with writing or typing, blurred vision, slurred speech, facial droop, or syncopal episodes.  Is not feels as though he is misplacing items around the house or getting lost while driving   Chronic Issues: Essential Hypertension - He reports he monitors at home routine basis and gets readings of 120s -130/70-80's.   BP Readings from Last 3 Encounters:  07/31/18 (!) 152/90  03/27/18 (!) 143/104  02/08/18 (!) 137/97   Melanoma - Diagnosed with Melanoma of left chest in 01/2018.Marland Kitchen Had wide excision with left axillary sentinal lymph node biopsy. He reports lymph node biopsy came back negative.   Health Maintenance: Dental -- Does not do routine care  Vision -- Routine Care Immunizations -- Needs Prevanr 23 and seasonal flu Colonoscopy -- Never had  Diet: Eats heart healthy diet.  Exercise: Lifts weights 4 x a week   Treatment Team  - Dermatology    Past Medical History:  Diagnosis Date  . High blood pressure   . History of kidney stones   . History of placement of chest tube    36 yrs. ago  . Melanoma ([place])    left chest    Past Surgical History:  Procedure Laterality Date  . MELANOMA EXCISION WITH SENTINEL LYMPH NODE BIOPSY Left 02/08/2018   Procedure: WIDE EXCISION OF LEFT CHEST MELANOMA AND LEFT AXILLARY SENTINEL LYMPH NODE;  Surgeon: Georganna Skeans, MD;  Location: [facility];  Service: General;  Laterality:  Left;  . MOUTH SURGERY      Current Outpatient Medications on File Prior to Visit  Medication Sig Dispense Refill  . Coenzyme Q10 (CO Q 10 PO) Take by mouth.    . Glucosamine-Chondroit-Vit C-Mn (GLUCOSAMINE CHONDR 1500 COMPLX PO) Take 1 tablet by mouth daily.    . Multiple Vitamins-Minerals (CENTRUM SILVER PO) Take 1 tablet by mouth daily.    . Omega-3 Fatty Acids (FISH OIL PO) Take 1 capsule by mouth daily.     No current facility-administered medications on file prior to visit.     No Known Allergies  Family History  Problem Relation Age of Onset  . Hypertension Mother   . Stroke Mother   . Hyperlipidemia Mother   . Heart disease Mother   . Hypertension Father   . Heart attack Father   . Heart disease Father   . Hyperlipidemia Father   . Cancer Brother     Social History   Socioeconomic History  . Marital status: Married    Spouse name: Not on file  . Number of children: Not on file  . Years of education: Not on file  . Highest education level: Not on file  Occupational History  . Not on file  Social Needs  . Financial resource strain: Not on file  . Food insecurity:    Worry: Not on file    Inability: Not on file  . Transportation  needs:    Medical: Not on file    Non-medical: Not on file  Tobacco Use  . Smoking status: Former Research scientist (life sciences)  . Smokeless tobacco: Never Used  . Tobacco comment: Quit smoking cigarettes in 1981  Substance and Sexual Activity  . Alcohol use: Yes    Comment: social   . Drug use: No  . Sexual activity: Not on file  Lifestyle  . Physical activity:    Days per week: Not on file    Minutes per session: Not on file  . Stress: Not on file  Relationships  . Social connections:    Talks on phone: Not on file    Gets together: Not on file    Attends religious service: Not on file    Active member of club or organization: Not on file    Attends meetings of clubs or organizations: Not on file    Relationship status: Not on file  .  Intimate partner violence:    Fear of current or ex partner: Not on file    Emotionally abused: Not on file    Physically abused: Not on file    Forced sexual activity: Not on file  Other Topics Concern  . Not on file  Social History Narrative  . Not on file    Review of Systems  Constitutional: Negative.   Eyes: Negative.   Respiratory: Negative.   Cardiovascular: Negative.   Gastrointestinal: Negative.   Genitourinary: Negative.   Musculoskeletal: Negative.   Skin: Negative.   Neurological: Positive for speech change.  Psychiatric/Behavioral: Negative.   All other systems reviewed and are negative.      BP (!) 152/90   Temp 98.5 F (36.9 C) (Oral)   Wt 172 lb (78 kg)   BMI 23.65 kg/m   Physical Exam  Constitutional: He is oriented to person, place, and time. He appears well-developed and well-nourished. No distress.  HENT:  Head: Normocephalic and atraumatic.  Right Ear: External ear normal.  Left Ear: External ear normal.  Nose: Nose normal.  Mouth/Throat: Oropharynx is clear and moist. No oropharyngeal exudate.  Eyes: Pupils are equal, round, and reactive to light. Conjunctivae and EOM are normal.  Neck: Normal range of motion. Neck supple.  Cardiovascular: Normal rate, regular rhythm, normal heart sounds and intact distal pulses. Exam reveals no gallop and no friction rub.  No murmur heard. Pulmonary/Chest: Effort normal and breath sounds normal. No stridor. No respiratory distress. He has no wheezes. He has no rales. He exhibits no tenderness.  Neurological: He is alert and oriented to person, place, and time. He displays normal reflexes. No cranial nerve deficit or sensory deficit. He exhibits normal muscle tone. Coordination normal.  Very mild and sporadic word finding issues noted during conversation  Skin: Skin is warm and dry. He is not diaphoretic.  Large surgical incision on left chest wall  Psychiatric: He has a normal mood and affect. His behavior is  normal. Judgment and thought content normal.  Nursing note and vitals reviewed.   Assessment/Plan: 1. Encounter to establish care - he will follow up for CPE or sooner with an acute issue  - Continue to exercise and eat healthy   2. Word finding difficulty -Tension above did notice a very mild word finding issue conversation today, this is likely an age-related process.  We will do work-up including B12 and vitamin D.  We will forego imaging at this time. - CBC with Differential/Platelet - Vitamin B12 - Vitamin D, 25-hydroxy -  Basic Metabolic Panel  3. Need for vaccination against Streptococcus pneumoniae  - Pneumococcal conjugate vaccine 13-valent  4. Colon cancer screening  - Ambulatory referral to Gastroenterology  Dorothyann Peng, NP

## 2018-07-31 NOTE — Patient Instructions (Signed)
It was great meeting you today and welcome to the team   I will follow up with you regarding your blood work   Someone will call you to schedule your colonoscopy   Please follow up for your physical   If you need anything in the meantime, please let me know

## 2018-08-01 ENCOUNTER — Encounter: Payer: Self-pay | Admitting: Gastroenterology

## 2018-08-03 DIAGNOSIS — R69 Illness, unspecified: Secondary | ICD-10-CM | POA: Diagnosis not present

## 2018-08-03 DIAGNOSIS — R030 Elevated blood-pressure reading, without diagnosis of hypertension: Secondary | ICD-10-CM | POA: Diagnosis not present

## 2018-08-03 DIAGNOSIS — Z87891 Personal history of nicotine dependence: Secondary | ICD-10-CM | POA: Diagnosis not present

## 2018-08-03 DIAGNOSIS — Z823 Family history of stroke: Secondary | ICD-10-CM | POA: Diagnosis not present

## 2018-08-03 DIAGNOSIS — Z85820 Personal history of malignant melanoma of skin: Secondary | ICD-10-CM | POA: Diagnosis not present

## 2018-08-03 DIAGNOSIS — Z8249 Family history of ischemic heart disease and other diseases of the circulatory system: Secondary | ICD-10-CM | POA: Diagnosis not present

## 2018-08-08 ENCOUNTER — Ambulatory Visit (AMBULATORY_SURGERY_CENTER): Payer: Self-pay

## 2018-08-08 VITALS — Ht 71.0 in | Wt 171.2 lb

## 2018-08-08 DIAGNOSIS — Z1211 Encounter for screening for malignant neoplasm of colon: Secondary | ICD-10-CM

## 2018-08-08 MED ORDER — NA SULFATE-K SULFATE-MG SULF 17.5-3.13-1.6 GM/177ML PO SOLN: 1.0000 | Freq: Once | ORAL | 0 refills | Status: AC

## 2018-08-08 NOTE — Progress Notes (Signed)
Per pt, no allergies to soy or egg products.Pt not taking any weight loss meds or using  O2 at home.  Pt refused emmi video. 

## 2018-08-14 ENCOUNTER — Encounter: Payer: Self-pay | Admitting: Adult Health

## 2018-08-14 ENCOUNTER — Ambulatory Visit (INDEPENDENT_AMBULATORY_CARE_PROVIDER_SITE_OTHER): Payer: Medicare HMO | Admitting: Adult Health

## 2018-08-14 VITALS — BP 150/94 | Temp 98.2°F | Ht 70.0 in | Wt 171.0 lb

## 2018-08-14 DIAGNOSIS — Z125 Encounter for screening for malignant neoplasm of prostate: Secondary | ICD-10-CM | POA: Diagnosis not present

## 2018-08-14 DIAGNOSIS — I10 Essential (primary) hypertension: Secondary | ICD-10-CM | POA: Diagnosis not present

## 2018-08-14 DIAGNOSIS — Z0000 Encounter for general adult medical examination without abnormal findings: Secondary | ICD-10-CM | POA: Diagnosis not present

## 2018-08-14 DIAGNOSIS — H9190 Unspecified hearing loss, unspecified ear: Secondary | ICD-10-CM

## 2018-08-14 DIAGNOSIS — Z23 Encounter for immunization: Secondary | ICD-10-CM | POA: Diagnosis not present

## 2018-08-14 LAB — HEPATIC FUNCTION PANEL
ALK PHOS: 72 U/L (ref 39–117)
ALT: 37 U/L (ref 0–53)
AST: 24 U/L (ref 0–37)
Albumin: 4.4 g/dL (ref 3.5–5.2)
BILIRUBIN DIRECT: 0.1 mg/dL (ref 0.0–0.3)
Total Bilirubin: 1 mg/dL (ref 0.2–1.2)
Total Protein: 7.2 g/dL (ref 6.0–8.3)

## 2018-08-14 LAB — TSH: TSH: 1.25 u[IU]/mL (ref 0.35–4.50)

## 2018-08-14 LAB — LIPID PANEL
Cholesterol: 256 mg/dL — ABNORMAL HIGH (ref 0–200)
HDL: 37.5 mg/dL — AB (ref >39.00)
LDL Cholesterol: 180 mg/dL — ABNORMAL HIGH (ref 0–99)
NonHDL: 218.52
TRIGLYCERIDES: 192 mg/dL — AB (ref 0.0–149.0)
Total CHOL/HDL Ratio: 7
VLDL: 38.4 mg/dL (ref 0.0–40.0)

## 2018-08-14 LAB — HEMOGLOBIN A1C: HEMOGLOBIN A1C: 6 % (ref 4.6–6.5)

## 2018-08-14 LAB — PSA: PSA: 1.47 ng/mL (ref 0.10–4.00)

## 2018-08-14 MED ORDER — LISINOPRIL 5 MG PO TABS: 5.0000 mg | ORAL_TABLET | Freq: Every day | ORAL | 3 refills | Status: DC

## 2018-08-14 NOTE — Patient Instructions (Signed)
It was great seeing you today!   I will follow up with you regarding your blood work   I have prescribed your lisinopril 5 mg to help bring down your blood pressure. Please send me your readings in 2 weeks via mychart   Someone will call you for your hearing exam

## 2018-08-14 NOTE — Progress Notes (Signed)
Subjective:    Patient ID: Robert Mann, male    DOB: 1951-11-13, 67 y.o.   MRN: [id number]  HPI Patient presents for yearly preventative medicine examination. He is a pleasant 67 year old male who  has a past medical history of High blood pressure, History of kidney stones, History of placement of chest tube (10/1979), Hyperlipidemia, Melanoma ([place]) (01/2018), and Pneumothorax (1980).   Essential Hypertension - reports BP readings consistently in the 140/90's at home. He has never been on blood pressure medication in the past. Denies any headaches blurred vision  Hard of hearing - his wife reports that she and his daughter often have to repeat themselves. He would like to have a hearing test done.   All immunizations and health maintenance protocols were reviewed with the patient and needed orders were placed. He is due to flu shot   Appropriate screening laboratory values were ordered for the patient including screening of hyperlipidemia, renal function and hepatic function. If indicated by BPH, a PSA was ordered.  Medication reconciliation,  past medical history, social history, problem list and allergies were reviewed in detail with the patient  Goals were established with regard to weight loss, exercise, and  diet in compliance with medications. He eats a heart healthy diet and exercises multiple times per week   End of life planning was discussed.  He has his colonoscopy scheduled. Does routine vision exams but does not do routine dental exams   Review of Systems  Constitutional: Negative.   HENT: Positive for hearing loss.   Eyes: Negative.   Respiratory: Negative.   Cardiovascular: Negative.   Gastrointestinal: Negative.   Endocrine: Negative.   Genitourinary: Negative.   Musculoskeletal: Negative.   Skin: Negative.   Allergic/Immunologic: Negative.   Neurological: Negative.   Hematological: Negative.   Psychiatric/Behavioral: Negative.   All other systems  reviewed and are negative.       Objective:   Physical Exam  Constitutional: He is oriented to person, place, and time. He appears well-developed and well-nourished. No distress.  HENT:  Head: Normocephalic and atraumatic.  Right Ear: External ear normal.  Left Ear: External ear normal.  Nose: Nose normal.  Mouth/Throat: Oropharynx is clear and moist. No oropharyngeal exudate.  Eyes: Pupils are equal, round, and reactive to light. Conjunctivae and EOM are normal. Right eye exhibits no discharge. Left eye exhibits no discharge. No scleral icterus.  Neck: Normal range of motion. Neck supple. No JVD present. No tracheal deviation present. No thyromegaly present.  Cardiovascular: Normal rate, regular rhythm, normal heart sounds and intact distal pulses. Exam reveals no gallop and no friction rub.  No murmur heard. Pulmonary/Chest: Effort normal and breath sounds normal. No stridor. No respiratory distress. He has no wheezes. He has no rales. He exhibits no tenderness.  Abdominal: Soft. Bowel sounds are normal. He exhibits no distension and no mass. There is no tenderness. There is no rebound and no guarding. No hernia.  Musculoskeletal: Normal range of motion.  Lymphadenopathy:    He has no cervical adenopathy.  Neurological: He is alert and oriented to person, place, and time. He displays normal reflexes. No cranial nerve deficit or sensory deficit. He exhibits normal muscle tone. Coordination normal.  Skin: Skin is warm and dry. Capillary refill takes less than 2 seconds. No rash noted. He is not diaphoretic. No erythema. No pallor.  Surgical scar on left chest wall   Psychiatric: He has a normal mood and affect. His behavior is normal. Judgment  and thought content normal.  Nursing note and vitals reviewed.     Assessment & Plan:  1. Routine general medical examination at a health care facility - One year follow up or sooner if needed - Continue to diet and exercise  - Hemoglobin  A1c - Hepatic function panel - Lipid panel - TSH  2. Prostate cancer screening  - PSA  3. Essential hypertension - Will start on low dose lisinopril. He will send me results via mychart in 2 weeks. Side effects reviewed  - lisinopril (PRINIVIL,ZESTRIL) 5 MG tablet; Take 1 tablet (5 mg total) by mouth daily.  Dispense: 90 tablet; Refill: 3 - Hemoglobin A1c - Hepatic function panel - Lipid panel - TSH  4. Need for influenza vaccination  - Flu vaccine HIGH DOSE PF (Fluzone High dose)  5. Hearing loss, unspecified hearing loss type, unspecified laterality - Ambulatory referral to Audiology  Dorothyann Peng, NP

## 2018-08-21 ENCOUNTER — Encounter: Payer: Self-pay | Admitting: Gastroenterology

## 2018-09-04 ENCOUNTER — Encounter: Payer: Self-pay | Admitting: Adult Health

## 2018-09-04 ENCOUNTER — Ambulatory Visit (AMBULATORY_SURGERY_CENTER): Payer: Medicare HMO | Admitting: Gastroenterology

## 2018-09-04 ENCOUNTER — Encounter: Payer: Self-pay | Admitting: Gastroenterology

## 2018-09-04 VITALS — BP 108/76 | HR 85 | Temp 99.1°F | Resp 18 | Ht 70.0 in | Wt 171.0 lb

## 2018-09-04 DIAGNOSIS — D120 Benign neoplasm of cecum: Secondary | ICD-10-CM

## 2018-09-04 DIAGNOSIS — Z1211 Encounter for screening for malignant neoplasm of colon: Secondary | ICD-10-CM

## 2018-09-04 MED ORDER — SODIUM CHLORIDE 0.9 % IV SOLN: 500.0000 mL | Freq: Once | INTRAVENOUS | Status: DC

## 2018-09-04 NOTE — Patient Instructions (Signed)
YOU HAD AN ENDOSCOPIC PROCEDURE TODAY AT THE [facility]:   Refer to the procedure report that was given to you for any specific questions about what was found during the examination.  If the procedure report does not answer your questions, please call your gastroenterologist to clarify.  If you requested that your care partner not be given the details of your procedure findings, then the procedure report has been included in a sealed envelope for you to review at your convenience later.  YOU SHOULD EXPECT: Some feelings of bloating in the abdomen. Passage of more gas than usual.  Walking can help get rid of the air that was put into your GI tract during the procedure and reduce the bloating. If you had a lower endoscopy (such as a colonoscopy or flexible sigmoidoscopy) you may notice spotting of blood in your stool or on the toilet paper. If you underwent a bowel prep for your procedure, you may not have a normal bowel movement for a few days.  Please Note:  You might notice some irritation and congestion in your nose or some drainage.  This is from the oxygen used during your procedure.  There is no need for concern and it should clear up in a day or so.  SYMPTOMS TO REPORT IMMEDIATELY:   Following lower endoscopy (colonoscopy or flexible sigmoidoscopy):  Excessive amounts of blood in the stool  Significant tenderness or worsening of abdominal pains  Swelling of the abdomen that is new, acute  Fever of 100F or higher  For urgent or emergent issues, a gastroenterologist can be reached at any hour by calling [phone].   DIET:  We do recommend a small meal at first, but then you may proceed to your regular diet.  Drink plenty of fluids but you should avoid alcoholic beverages for 24 hours.  MEDICATIONS: Continue present medications.  Please see handouts given to you by your recovery nurse.  ACTIVITY:  You should plan to take it easy for the rest of today and you should NOT  DRIVE or use heavy machinery until tomorrow (because of the sedation medicines used during the test).    FOLLOW UP: Our staff will call the number listed on your records the next business day following your procedure to check on you and address any questions or concerns that you may have regarding the information given to you following your procedure. If we do not reach you, we will leave a message.  However, if you are feeling well and you are not experiencing any problems, there is no need to return our call.  We will assume that you have returned to your regular daily activities without incident.  If any biopsies were taken you will be contacted by phone or by letter within the next 1-3 weeks.  Please call us at [phone] if you have not heard about the biopsies in 3 weeks.   Thank you for allowing us to provide for your healthcare needs today.   SIGNATURES/CONFIDENTIALITY: You and/or your care partner have signed paperwork which will be entered into your electronic medical record.  These signatures attest to the fact that that the information above on your After Visit Summary has been reviewed and is understood.  Full responsibility of the confidentiality of this discharge information lies with you and/or your care-partner. 

## 2018-09-04 NOTE — Progress Notes (Signed)
Called to room to assist during endoscopic procedure.  Patient ID and intended procedure confirmed with present staff. Received instructions for my participation in the procedure from the performing physician.  

## 2018-09-04 NOTE — Op Note (Signed)
[facility] Patient Name: Robert Mann Procedure Date: 09/04/2018 1:34 PM MRN: [id number] Endoscopist: Mauri Pole , MD Age: 68 Referring MD:  Date of Birth: September 18, 1951 Gender: Male Account #: [id number] Procedure:                Colonoscopy Indications:              Screening for colorectal malignant neoplasm Medicines:                Monitored Anesthesia Care Procedure:                Pre-Anesthesia Assessment:                           - Prior to the procedure, a History and Physical                            was performed, and patient medications and                            allergies were reviewed. The patient's tolerance of                            previous anesthesia was also reviewed. The risks                            and benefits of the procedure and the sedation                            options and risks were discussed with the patient.                            All questions were answered, and informed consent                            was obtained. Prior Anticoagulants: The patient has                            taken no previous anticoagulant or antiplatelet                            agents. ASA Grade Assessment: II - A patient with                            mild systemic disease. After reviewing the risks                            and benefits, the patient was deemed in                            satisfactory condition to undergo the procedure.                           After obtaining informed consent, the colonoscope  was passed under direct vision. Throughout the                            procedure, the patient's blood pressure, pulse, and                            oxygen saturations were monitored continuously. The                            Colonoscope was introduced through the anus and                            advanced to the the cecum, identified by                            appendiceal orifice and  ileocecal valve. The                            colonoscopy was performed without difficulty. The                            patient tolerated the procedure well. Scope In: 1:45:34 PM Scope Out: 2:01:59 PM Scope Withdrawal Time: 0 hours 11 minutes 49 seconds  Total Procedure Duration: 0 hours 16 minutes 25 seconds  Findings:                 The perianal and digital rectal examinations were                            normal.                           A 2 mm polyp was found in the cecum. The polyp was                            sessile. The polyp was removed with a cold biopsy                            forceps. Resection and retrieval were complete.                           Scattered small and large-mouthed diverticula were                            found in the sigmoid colon, descending colon,                            transverse colon and ascending colon. There was                            evidence of an impacted diverticulum.                           Non-bleeding internal hemorrhoids were found during  retroflexion. The hemorrhoids were medium-sized. Complications:            No immediate complications. Estimated Blood Loss:     Estimated blood loss was minimal. Impression:               - One 2 mm polyp in the cecum, removed with a cold                            biopsy forceps. Resected and retrieved.                           - Moderate diverticulosis in the sigmoid colon, in                            the descending colon, in the transverse colon and                            in the ascending colon. There was evidence of an                            impacted diverticulum.                           - Non-bleeding internal hemorrhoids. Recommendation:           - Patient has a contact number available for                            emergencies. The signs and symptoms of potential                            delayed complications were discussed with  the                            patient. Return to normal activities tomorrow.                            Written discharge instructions were provided to the                            patient.                           - Resume previous diet.                           - Continue present medications.                           - Await pathology results.                           - Repeat colonoscopy in 5-10 years for surveillance                            based on pathology results. Mauri Pole, MD 09/04/2018 2:11:55 PM This  report has been signed electronically.

## 2018-09-04 NOTE — Progress Notes (Signed)
Alert and oriented x3, pleased with MAC, report to RN  

## 2018-09-05 ENCOUNTER — Telehealth: Payer: Self-pay

## 2018-09-05 ENCOUNTER — Other Ambulatory Visit: Payer: Self-pay | Admitting: Adult Health

## 2018-09-05 DIAGNOSIS — H9190 Unspecified hearing loss, unspecified ear: Secondary | ICD-10-CM

## 2018-09-05 NOTE — Telephone Encounter (Signed)
  Follow up Call-  Call back number 09/04/2018  Post procedure Call Back phone  # [phone] cell  Permission to leave phone message Yes  Some recent data might be hidden     Patient questions:  Do you have a fever, pain , or abdominal swelling? No. Pain Score  0 *  Have you tolerated food without any problems? Yes.    Have you been able to return to your normal activities? Yes.    Do you have any questions about your discharge instructions: Diet   No. Medications  No. Follow up visit  No.  Do you have questions or concerns about your Care? No.  Actions: * If pain score is 4 or above: No action needed, pain <4.

## 2018-09-11 ENCOUNTER — Encounter: Payer: Self-pay | Admitting: Gastroenterology

## 2018-09-18 DIAGNOSIS — Z85828 Personal history of other malignant neoplasm of skin: Secondary | ICD-10-CM | POA: Diagnosis not present

## 2018-09-18 DIAGNOSIS — D224 Melanocytic nevi of scalp and neck: Secondary | ICD-10-CM | POA: Diagnosis not present

## 2018-09-18 DIAGNOSIS — Z85820 Personal history of malignant melanoma of skin: Secondary | ICD-10-CM | POA: Diagnosis not present

## 2018-09-18 DIAGNOSIS — L570 Actinic keratosis: Secondary | ICD-10-CM | POA: Diagnosis not present

## 2018-09-18 DIAGNOSIS — D225 Melanocytic nevi of trunk: Secondary | ICD-10-CM | POA: Diagnosis not present

## 2018-09-18 DIAGNOSIS — D1801 Hemangioma of skin and subcutaneous tissue: Secondary | ICD-10-CM | POA: Diagnosis not present

## 2018-11-02 ENCOUNTER — Encounter: Payer: Self-pay | Admitting: Adult Health

## 2018-12-04 ENCOUNTER — Ambulatory Visit (INDEPENDENT_AMBULATORY_CARE_PROVIDER_SITE_OTHER): Payer: Medicare HMO | Admitting: Adult Health

## 2018-12-04 ENCOUNTER — Encounter: Payer: Self-pay | Admitting: Adult Health

## 2018-12-04 VITALS — BP 144/106 | Temp 98.2°F | Wt 166.0 lb

## 2018-12-04 DIAGNOSIS — I10 Essential (primary) hypertension: Secondary | ICD-10-CM | POA: Diagnosis not present

## 2018-12-04 DIAGNOSIS — Z1159 Encounter for screening for other viral diseases: Secondary | ICD-10-CM

## 2018-12-04 MED ORDER — AMLODIPINE BESYLATE 2.5 MG PO TABS: 2.5000 mg | ORAL_TABLET | Freq: Every day | ORAL | 3 refills | Status: DC

## 2018-12-04 NOTE — Progress Notes (Signed)
Subjective:    Patient ID: Robert Mann, male    DOB: 02/12/1951, 68 y.o.   MRN: [id number]  HPI  68 year old male who  has a past medical history of High blood pressure, History of kidney stones, History of placement of chest tube (10/1979), Hyperlipidemia, Melanoma ([place]) (01/2018), and Pneumothorax (1980).  He presents to the office today for medication management. He was prescribed lisinopril back in September. Soon after starting the medication he started to develop a dry cough, dizziness with standing and " clumsiness". He never brought this up at previous appointments.   He has since stopped lisinopril about 2 weeks ago and his symptoms have resolved.  He would also like to be tested for Hep B    Review of Systems See HPI   Past Medical History:  Diagnosis Date  . High blood pressure    has "white coat syndrome"/ no meds  . History of kidney stones   . History of placement of chest tube 10/1979   36 yrs. ago  . Hyperlipidemia   . Melanoma ([place]) 01/2018   left chest  . Pneumothorax 1980   30 + years ago     Social History   Socioeconomic History  . Marital status: Married    Spouse name: Not on file  . Number of children: Not on file  . Years of education: Not on file  . Highest education level: Not on file  Occupational History  . Not on file  Social Needs  . Financial resource strain: Not on file  . Food insecurity:    Worry: Not on file    Inability: Not on file  . Transportation needs:    Medical: Not on file    Non-medical: Not on file  Tobacco Use  . Smoking status: Former Smoker    Last attempt to quit: 08/08/1980    Years since quitting: 38.3  . Smokeless tobacco: Never Used  . Tobacco comment: Quit smoking cigarettes in 1981  Substance and Sexual Activity  . Alcohol use: Yes    Comment: social   . Drug use: No  . Sexual activity: Not on file  Lifestyle  . Physical activity:    Days per week: Not on file    Minutes per session: Not  on file  . Stress: Not on file  Relationships  . Social connections:    Talks on phone: Not on file    Gets together: Not on file    Attends religious service: Not on file    Active member of club or organization: Not on file    Attends meetings of clubs or organizations: Not on file    Relationship status: Not on file  . Intimate partner violence:    Fear of current or ex partner: Not on file    Emotionally abused: Not on file    Physically abused: Not on file    Forced sexual activity: Not on file  Other Topics Concern  . Not on file  Social History Narrative   Retired    Married     Past Surgical History:  Procedure Laterality Date  . MELANOMA EXCISION WITH SENTINEL LYMPH NODE BIOPSY Left 02/08/2018   Procedure: WIDE EXCISION OF LEFT CHEST MELANOMA AND LEFT AXILLARY SENTINEL LYMPH NODE;  Surgeon: Georganna Skeans, MD;  Location: [facility];  Service: General;  Laterality: Left;  . WISDOM TOOTH EXTRACTION      Family History  Problem Relation Age of Onset  .  Hypertension Mother   . Stroke Mother   . Hyperlipidemia Mother   . Heart disease Mother   . Hypertension Father   . Heart attack Father   . Heart disease Father   . Hyperlipidemia Father   . Cancer Brother 66       Glioblastoma     Allergies  Allergen Reactions  . Lisinopril Cough    Current Outpatient Medications on File Prior to Visit  Medication Sig Dispense Refill  . Cholecalciferol (VITAMIN D3) 20 MCG TABS Take 25 mcg by mouth daily.    . Coenzyme Q10 (CO Q 10 PO) Take 300 mg by mouth daily.     . Glucosamine-Chondroit-Vit C-Mn (GLUCOSAMINE CHONDR 1500 COMPLX PO) Take 1 tablet by mouth daily.    . Multiple Vitamins-Minerals (CENTRUM SILVER PO) Take 1 tablet by mouth daily.    . naproxen sodium (ALEVE) 220 MG tablet Take 220 mg by mouth as needed.    . Omega-3 Fatty Acids (FISH OIL PO) Take 1 capsule by mouth daily.     Current Facility-Administered Medications on File Prior to Visit  Medication Dose  Route Frequency Provider Last Rate Last Dose  . 0.9 %  sodium chloride infusion  500 mL Intravenous Once Nandigam, Kavitha V, MD        BP (!) 144/106   Temp 98.2 F (36.8 C)   Wt 166 lb (75.3 kg)   BMI 23.82 kg/m       Objective:   Physical Exam Vitals signs reviewed.  Constitutional:      Appearance: Normal appearance.  Cardiovascular:     Rate and Rhythm: Normal rate and regular rhythm.  Pulmonary:     Effort: Pulmonary effort is normal.     Breath sounds: Normal breath sounds.  Skin:    General: Skin is warm and dry.     Capillary Refill: Capillary refill takes less than 2 seconds.  Neurological:     General: No focal deficit present.     Mental Status: He is alert and oriented to person, place, and time.  Psychiatric:        Mood and Affect: Mood normal.        Behavior: Behavior normal.        Thought Content: Thought content normal.        Judgment: Judgment normal.       Assessment & Plan:  1. Essential hypertension - Follow up in two weeks or sooner if needed - Monitor BP at home  - amLODipine (NORVASC) 2.5 MG tablet; Take 1 tablet (2.5 mg total) by mouth daily.  Dispense: 90 tablet; Refill: 3  2. Need for hepatitis C screening test  - Hep C Antibody   Dorothyann Peng, NP

## 2018-12-05 LAB — HEPATITIS C ANTIBODY
HEP C AB: NONREACTIVE
SIGNAL TO CUT-OFF: 0.01 (ref <1.00)

## 2019-03-26 DIAGNOSIS — Z85820 Personal history of malignant melanoma of skin: Secondary | ICD-10-CM | POA: Diagnosis not present

## 2019-03-26 DIAGNOSIS — D225 Melanocytic nevi of trunk: Secondary | ICD-10-CM | POA: Diagnosis not present

## 2019-03-26 DIAGNOSIS — L821 Other seborrheic keratosis: Secondary | ICD-10-CM | POA: Diagnosis not present

## 2019-03-26 DIAGNOSIS — D485 Neoplasm of uncertain behavior of skin: Secondary | ICD-10-CM | POA: Diagnosis not present

## 2019-03-26 DIAGNOSIS — L570 Actinic keratosis: Secondary | ICD-10-CM | POA: Diagnosis not present

## 2019-08-15 ENCOUNTER — Encounter: Payer: Self-pay | Admitting: Adult Health

## 2019-08-15 DIAGNOSIS — R69 Illness, unspecified: Secondary | ICD-10-CM | POA: Diagnosis not present

## 2019-12-26 ENCOUNTER — Encounter: Payer: Self-pay | Admitting: Adult Health

## 2019-12-31 ENCOUNTER — Ambulatory Visit (INDEPENDENT_AMBULATORY_CARE_PROVIDER_SITE_OTHER): Payer: Medicare HMO

## 2019-12-31 VITALS — BP 132/75 | Ht 71.0 in | Wt 169.5 lb

## 2019-12-31 DIAGNOSIS — Z0000 Encounter for general adult medical examination without abnormal findings: Secondary | ICD-10-CM

## 2019-12-31 NOTE — Progress Notes (Signed)
This visit is being conducted via phone call due to the COVID-19 pandemic. This patient has given me verbal consent via phone to conduct this visit, patient states they are participating from their home address. Some vital signs may be absent or patient reported.   Patient identification: identified by name, DOB, and current address.  Location provider: [facility], Office Persons participating in the virtual visit: [name] and Franne Forts, LPN.    Subjective:   Robert Mann is a 70 y.o. male who presents for an Initial Medicare Annual Wellness Visit.  Robert Mann is working part time and his job requires him to do a lot of walking. Additionally, he works out at a gym 4 days a week. He reports eating healthy, balanced meals and drinking 3 bottles of water daily.   Review of Systems  No ROS; Annual Medicare Wellness Initial Visit      Objective:    Today's Vitals   12/31/19 1321  BP: 132/75  Weight: 169 lb 8 oz (76.9 kg)  Height: 5\' 11"  (1.803 m)   Body mass index is 23.64 kg/m.  Advanced Directives 12/31/2019 03/27/2018 02/08/2018  Does Patient Have a Medical Advance Directive? Yes Yes Yes  Type of Paramedic of [place];Living will [place];Living will [place];Living will  Does patient want to make changes to medical advance directive? No - Patient declined - No - Patient declined  Copy of [place] in Chart? No - copy requested No - copy requested No - copy requested    Current Medications (verified) Outpatient Encounter Medications as of 12/31/2019  Medication Sig  . Cholecalciferol (VITAMIN D3) 20 MCG TABS Take 25 mcg by mouth daily.  . Coenzyme Q10 (CO Q 10 PO) Take 300 mg by mouth daily.   . Cyanocobalamin (VITAMIN B 12 PO) Take by mouth.  . Glucosamine-Chondroit-Vit C-Mn (GLUCOSAMINE CHONDR 1500 COMPLX PO) Take 1 tablet by mouth daily.  . naproxen sodium (ALEVE) 220  MG tablet Take 220 mg by mouth as needed.  . Omega-3 Fatty Acids (FISH OIL PO) Take 1 capsule by mouth daily.  Marland Kitchen amLODipine (NORVASC) 2.5 MG tablet Take 1 tablet (2.5 mg total) by mouth daily. (Patient not taking: Reported on 12/31/2019)  . Multiple Vitamins-Minerals (CENTRUM SILVER PO) Take 1 tablet by mouth daily.   Facility-Administered Encounter Medications as of 12/31/2019  Medication  . 0.9 %  sodium chloride infusion    Allergies (verified) Lisinopril   History: Past Medical History:  Diagnosis Date  . High blood pressure    has "white coat syndrome"/ no meds  . History of kidney stones   . History of placement of chest tube 10/1979   36 yrs. ago  . Hyperlipidemia   . Melanoma ([place]) 01/2018   left chest  . Pneumothorax 1980   30 + years ago    Past Surgical History:  Procedure Laterality Date  . MELANOMA EXCISION WITH SENTINEL LYMPH NODE BIOPSY Left 02/08/2018   Procedure: WIDE EXCISION OF LEFT CHEST MELANOMA AND LEFT AXILLARY SENTINEL LYMPH NODE;  Surgeon: Georganna Skeans, MD;  Location: [facility];  Service: General;  Laterality: Left;  . WISDOM TOOTH EXTRACTION     Family History  Problem Relation Age of Onset  . Hypertension Mother   . Stroke Mother   . Hyperlipidemia Mother   . Heart disease Mother   . Hypertension Father   . Heart attack Father   . Heart disease Father   .  Hyperlipidemia Father   . Cancer Brother 50       Glioblastoma    Social History   Socioeconomic History  . Marital status: Married    Spouse name: Not on file  . Number of children: 2  . Years of education: Not on file  . Highest education level: Not on file  Occupational History  . Occupation: part time   Tobacco Use  . Smoking status: Former Smoker    Quit date: 08/08/1980    Years since quitting: 39.4  . Smokeless tobacco: Never Used  . Tobacco comment: Quit smoking cigarettes in 1981  Substance and Sexual Activity  . Alcohol use: Yes    Comment: social   . Drug use: No  .  Sexual activity: Not on file  Other Topics Concern  . Not on file  Social History Narrative   Working PT; job requires a tremendous amount of walking   Married    3 children   Social Determinants of Radio broadcast assistant Strain: [place]   . Difficulty of Paying Living Expenses: Not hard at all  Food Insecurity: No Food Insecurity  . Worried About Charity fundraiser in the Last Year: Never true  . Ran Out of Food in the Last Year: Never true  Transportation Needs: No Transportation Needs  . Lack of Transportation (Medical): No  . Lack of Transportation (Non-Medical): No  Physical Activity: Sufficiently Active  . Days of Exercise per Week: 4 days  . Minutes of Exercise per Session: 90 min  Stress: No Stress Concern Present  . Feeling of Stress : Only a little  Social Connections: Not Isolated  . Frequency of Communication with Friends and Family: More than three times a week  . Frequency of Social Gatherings with Friends and Family: Once a week  . Attends Religious Services: More than 4 times per year  . Active Member of Clubs or Organizations: Yes  . Attends Archivist Meetings: More than 4 times per year  . Marital Status: Married   Tobacco Counseling Counseling given: Not Answered Comment: Quit smoking cigarettes in 1981   Clinical Intake:  Pre-visit preparation completed: Yes  Pain : No/denies pain     BMI - recorded: 23.64 Nutritional Status: BMI of 19-24  Normal Diabetes: No  How often do you need to have someone help you when you read instructions, pamphlets, or other written materials from your doctor or pharmacy?: 1 - Never  Interpreter Needed?: No  Information entered by :: Franne Forts, LPN.  Activities of Daily Living No flowsheet data found.   Immunizations and Health Maintenance Immunization History  Administered Date(s) Administered  . Fluad Quad(high Dose 65+) 08/15/2019  . Influenza, High Dose Seasonal PF 08/14/2018  .  Pneumococcal Conjugate-13 07/31/2018  . Pneumococcal Polysaccharide-23 08/15/2019  . Tdap 07/29/2015  . Zoster 03/27/2016   There are no preventive care reminders to display for this patient.  Patient Care Team: Dorothyann Peng, NP as PCP - General (Family Medicine)  Indicate any recent Medical Services you may have received from other than [facility] providers in the past year (date may be approximate).    Assessment:   This is a routine wellness examination for Robert Mann.  Hearing/Vision screen  Hearing Screening   125Hz  250Hz  500Hz  1000Hz  2000Hz  3000Hz  4000Hz  6000Hz  8000Hz   Right ear:           Left ear:           Comments: Denies hearing  problems  Vision Screening Comments: Wears contacts and is currently trying to schedule a routine visit  Dietary issues and exercise activities discussed:    Goals    . DIET - INCREASE WATER INTAKE     Goal 6-8  8 oz bottles per day      Depression Screen PHQ 2/9 Scores 12/31/2019  PHQ - 2 Score 0    Fall Risk Fall Risk  12/31/2019  Falls in the past year? 0  Follow up Falls evaluation completed;Education provided;Falls prevention discussed    Is the patient's home free of loose throw rugs in walkways, pet beds, electrical cords, etc?   yes      Grab bars in the bathroom? yes      Handrails on the stairs?   yes      Adequate lighting?   yes  Timed Get Up and Go performed: N/A due to telephone visit  Cognitive Function:     6CIT Screen 12/31/2019  What Year? 0 points  What month? 0 points  What time? 0 points  Count back from 20 0 points  Months in reverse 0 points  Repeat phrase 0 points  Total Score 0    Screening Tests Health Maintenance  Topic Date Due  . TETANUS/TDAP  07/28/2025  . COLONOSCOPY  09/04/2028  . INFLUENZA VACCINE  Completed  . Hepatitis C Screening  Completed  . PNA vac Low Risk Adult  Completed    Qualifies for Shingles Vaccine? Patient states he has completed shingrix series. Records requested from that  pharmacy.  Cancer Screenings: Lung: Low Dose CT Chest recommended if Age 98-80 years, 30 pack-year currently smoking OR have quit w/in 15years. Patient does not qualify. Colorectal: yes  Additional Screenings:  Hepatitis C Screening: completed 12/04/2018.     Plan:   Robert Mann is up to date with all preventive health screenings and immunizations. He has not made a decision yet about the covid vaccine. He does not have any complaints or concerns today.  I have personally reviewed and noted the following in the patient's chart:   . Medical and social history . Use of alcohol, tobacco or illicit drugs  . Current medications and supplements . Functional ability and status . Nutritional status . Physical activity . Advanced directives . List of other physicians . Hospitalizations, surgeries, and ER visits in previous 12 months . Vitals . Screenings to include cognitive, depression, and falls . Referrals and appointments  In addition, I have reviewed and discussed with patient certain preventive protocols, quality metrics, and best practice recommendations. A written personalized care plan for preventive services as well as general preventive health recommendations were provided to patient.     Franne Forts, LPN   [phone]

## 2019-12-31 NOTE — Patient Instructions (Signed)
[name] , Thank you for taking time to participate in your Medicare Wellness Visit. I appreciate your ongoing commitment to your health goals. Please review the following plan we discussed and let me know if I can assist you in the future.   Screening recommendations/referrals: Colorectal Screening: colonoscopy completed 09/04/2018; repeat 09/05/2028.  Vision and Dental Exams: Recommended annual ophthalmology exams for early detection of glaucoma and other disorders of the eye. Patient reports he wears contacts and needs to schedule a routine eye exam. Recommended annual dental exams for proper oral hygiene. Patient reports he has not been to a dentist in quite some time. Please schedule a routine appointment as soon as you feel comfortable doing so to maintain good oral health.  Diabetic Exams: Diabetic Eye Exam: N/A Diabetic Foot Exam: N/A  Vaccinations: Influenza vaccine: completed 08/15/2019; due again in Fall 2021. Pneumococcal vaccine: completed 07/31/2018 & 08/15/2019. Up to date.  Tdap vaccine: completed 07/29/2015; due again 07/28/2025. Shingles vaccine: Patient states he had both shingrix vaccines from [facility]. Those records were requested by fax today.  Advanced directives: Advance directives discussed with you today. Please bring a copy of your POA (Power of [place]) and/or Living Will to your next appointment.  Goals: Recommend to drink at least 6-8 8oz glasses of water per day.  Continue to exercise for at least 150 minutes per week.  Recommend to remove any items from the home that may cause slips or trips.  Next appointment: Please schedule your Annual Wellness Visit with your Nurse Health Advisor in one year.  Preventive Care 68 Years and Older, Male Preventive care refers to lifestyle choices and visits with your health care provider that can promote health and wellness. What does preventive care include?  A yearly physical exam. This is also called an  annual well check.  Dental exams once or twice a year.  Routine eye exams. Ask your health care provider how often you should have your eyes checked.  Personal lifestyle choices, including:  Daily care of your teeth and gums.  Regular physical activity.  Eating a healthy diet.  Avoiding tobacco and drug use.  Limiting alcohol use.  Practicing safe sex.  Taking low doses of aspirin every day if recommended by your health care provider..  Taking vitamin and mineral supplements as recommended by your health care provider. What happens during an annual well check? The services and screenings done by your health care provider during your annual well check will depend on your age, overall health, lifestyle risk factors, and family history of disease. Counseling  Your health care provider may ask you questions about your:  Alcohol use.  Tobacco use.  Drug use.  Emotional well-being.  Home and relationship well-being.  Sexual activity.  Eating habits.  History of falls.  Memory and ability to understand (cognition).  Work and work Statistician. Screening  You may have the following tests or measurements:  Height, weight, and BMI.  Blood pressure.  Lipid and cholesterol levels. These may be checked every 5 years, or more frequently if you are over 13 years old.  Skin check.  Lung cancer screening. You may have this screening every year starting at age 68 if you have a 30-pack-year history of smoking and currently smoke or have quit within the past 15 years.  Fecal occult blood test (FOBT) of the stool. You may have this test every year starting at age 68.  Flexible sigmoidoscopy or colonoscopy. You may have a sigmoidoscopy every 5  years or a colonoscopy every 10 years starting at age 68.  Prostate cancer screening. Recommendations will vary depending on your family history and other risks.  Hepatitis C blood test.  Hepatitis B blood test.  Sexually  transmitted disease (STD) testing.  Diabetes screening. This is done by checking your blood sugar (glucose) after you have not eaten for a while (fasting). You may have this done every 1-3 years.  Abdominal aortic aneurysm (AAA) screening. You may need this if you are a current or former smoker.  Osteoporosis. You may be screened starting at age 68 if you are at high risk. Talk with your health care provider about your test results, treatment options, and if necessary, the need for more tests. Vaccines  Your health care provider may recommend certain vaccines, such as:  Influenza vaccine. This is recommended every year.  Tetanus, diphtheria, and acellular pertussis (Tdap, Td) vaccine. You may need a Td booster every 10 years.  Zoster vaccine. You may need this after age 40.  Pneumococcal 13-valent conjugate (PCV13) vaccine. One dose is recommended after age 34.  Pneumococcal polysaccharide (PPSV23) vaccine. One dose is recommended after age 10. Talk to your health care provider about which screenings and vaccines you need and how often you need them. This information is not intended to replace advice given to you by your health care provider. Make sure you discuss any questions you have with your health care provider. Document Released: 12/10/2015 Document Revised: 08/02/2016 Document Reviewed: 09/14/2015 Elsevier Interactive Patient Education  2017 [place] Prevention in the Home Falls can cause injuries. They can happen to people of all ages. There are many things you can do to make your home safe and to help prevent falls. What can I do on the outside of my home?  Regularly fix the edges of walkways and driveways and fix any cracks.  Remove anything that might make you trip as you walk through a door, such as a raised step or threshold.  Trim any bushes or trees on the path to your home.  Use bright outdoor lighting.  Clear any walking paths of anything that might  make someone trip, such as rocks or tools.  Regularly check to see if handrails are loose or broken. Make sure that both sides of any steps have handrails.  Any raised decks and porches should have guardrails on the edges.  Have any leaves, snow, or ice cleared regularly.  Use sand or salt on walking paths during winter.  Clean up any spills in your garage right away. This includes oil or grease spills. What can I do in the bathroom?  Use night lights.  Install grab bars by the toilet and in the tub and shower. Do not use towel bars as grab bars.  Use non-skid mats or decals in the tub or shower.  If you need to sit down in the shower, use a plastic, non-slip stool.  Keep the floor dry. Clean up any water that spills on the floor as soon as it happens.  Remove soap buildup in the tub or shower regularly.  Attach bath mats securely with double-sided non-slip rug tape.  Do not have throw rugs and other things on the floor that can make you trip. What can I do in the bedroom?  Use night lights.  Make sure that you have a light by your bed that is easy to reach.  Do not use any sheets or blankets that are too big for  your bed. They should not hang down onto the floor.  Have a firm chair that has side arms. You can use this for support while you get dressed.  Do not have throw rugs and other things on the floor that can make you trip. What can I do in the kitchen?  Clean up any spills right away.  Avoid walking on wet floors.  Keep items that you use a lot in easy-to-reach places.  If you need to reach something above you, use a strong step stool that has a grab bar.  Keep electrical cords out of the way.  Do not use floor polish or wax that makes floors slippery. If you must use wax, use non-skid floor wax.  Do not have throw rugs and other things on the floor that can make you trip. What can I do with my stairs?  Do not leave any items on the stairs.  Make sure  that there are handrails on both sides of the stairs and use them. Fix handrails that are broken or loose. Make sure that handrails are as long as the stairways.  Check any carpeting to make sure that it is firmly attached to the stairs. Fix any carpet that is loose or worn.  Avoid having throw rugs at the top or bottom of the stairs. If you do have throw rugs, attach them to the floor with carpet tape.  Make sure that you have a light switch at the top of the stairs and the bottom of the stairs. If you do not have them, ask someone to add them for you. What else can I do to help prevent falls?  Wear shoes that:  Do not have high heels.  Have rubber bottoms.  Are comfortable and fit you well.  Are closed at the toe. Do not wear sandals.  If you use a stepladder:  Make sure that it is fully opened. Do not climb a closed stepladder.  Make sure that both sides of the stepladder are locked into place.  Ask someone to hold it for you, if possible.  Clearly mark and make sure that you can see:  Any grab bars or handrails.  First and last steps.  Where the edge of each step is.  Use tools that help you move around (mobility aids) if they are needed. These include:  Canes.  Walkers.  Scooters.  Crutches.  Turn on the lights when you go into a dark area. Replace any light bulbs as soon as they burn out.  Set up your furniture so you have a clear path. Avoid moving your furniture around.  If any of your floors are uneven, fix them.  If there are any pets around you, be aware of where they are.  Review your medicines with your doctor. Some medicines can make you feel dizzy. This can increase your chance of falling. Ask your doctor what other things that you can do to help prevent falls. This information is not intended to replace advice given to you by your health care provider. Make sure you discuss any questions you have with your health care provider. Document  Released: 09/09/2009 Document Revised: 04/20/2016 Document Reviewed: 12/18/2014 Elsevier Interactive Patient Education  2017 [employer].

## 2020-01-04 DIAGNOSIS — Z0100 Encounter for examination of eyes and vision without abnormal findings: Secondary | ICD-10-CM | POA: Diagnosis not present

## 2020-01-14 DIAGNOSIS — Z0100 Encounter for examination of eyes and vision without abnormal findings: Secondary | ICD-10-CM | POA: Diagnosis not present

## 2020-03-31 DIAGNOSIS — D2271 Melanocytic nevi of right lower limb, including hip: Secondary | ICD-10-CM | POA: Diagnosis not present

## 2020-03-31 DIAGNOSIS — D225 Melanocytic nevi of trunk: Secondary | ICD-10-CM | POA: Diagnosis not present

## 2020-03-31 DIAGNOSIS — D1801 Hemangioma of skin and subcutaneous tissue: Secondary | ICD-10-CM | POA: Diagnosis not present

## 2020-03-31 DIAGNOSIS — D2272 Melanocytic nevi of left lower limb, including hip: Secondary | ICD-10-CM | POA: Diagnosis not present

## 2020-03-31 DIAGNOSIS — Z85820 Personal history of malignant melanoma of skin: Secondary | ICD-10-CM | POA: Diagnosis not present

## 2020-03-31 DIAGNOSIS — L821 Other seborrheic keratosis: Secondary | ICD-10-CM | POA: Diagnosis not present

## 2020-03-31 DIAGNOSIS — L570 Actinic keratosis: Secondary | ICD-10-CM | POA: Diagnosis not present

## 2020-09-15 DIAGNOSIS — Z87891 Personal history of nicotine dependence: Secondary | ICD-10-CM | POA: Diagnosis not present

## 2020-09-15 DIAGNOSIS — Z809 Family history of malignant neoplasm, unspecified: Secondary | ICD-10-CM | POA: Diagnosis not present

## 2020-09-15 DIAGNOSIS — R030 Elevated blood-pressure reading, without diagnosis of hypertension: Secondary | ICD-10-CM | POA: Diagnosis not present

## 2020-09-15 DIAGNOSIS — Z823 Family history of stroke: Secondary | ICD-10-CM | POA: Diagnosis not present

## 2020-09-15 DIAGNOSIS — Z85828 Personal history of other malignant neoplasm of skin: Secondary | ICD-10-CM | POA: Diagnosis not present

## 2020-09-15 DIAGNOSIS — R69 Illness, unspecified: Secondary | ICD-10-CM | POA: Diagnosis not present

## 2020-09-15 DIAGNOSIS — Z8249 Family history of ischemic heart disease and other diseases of the circulatory system: Secondary | ICD-10-CM | POA: Diagnosis not present

## 2021-01-04 ENCOUNTER — Telehealth: Payer: Self-pay | Admitting: Adult Health

## 2021-01-04 NOTE — Telephone Encounter (Signed)
Left message for patient to call back and schedule Medicare Annual Wellness Visit (AWV) either virtually or in office.   Last AWV 12/31/19  please schedule at anytime with [facility] Nurse Health Advisor 1 or 2   This should be a 45 minute visit. Patient also needs appointment with pcp last appointment 12/04/2018

## 2021-02-25 ENCOUNTER — Telehealth: Payer: Self-pay | Admitting: Adult Health

## 2021-02-25 NOTE — Telephone Encounter (Signed)
Left message for patient to call back and schedule Medicare Annual Wellness Visit (AWV) either virtually or in office. No detailed message left    Last AWV 12/31/19  please schedule at anytime with [facility] Nurse Health Advisor 1 or 2  Patient also needs appointment with pcp lat appointment 12/04/18  This should be a 45 minute visit.

## 2021-03-30 DIAGNOSIS — D485 Neoplasm of uncertain behavior of skin: Secondary | ICD-10-CM | POA: Diagnosis not present

## 2021-03-30 DIAGNOSIS — L814 Other melanin hyperpigmentation: Secondary | ICD-10-CM | POA: Diagnosis not present

## 2021-03-30 DIAGNOSIS — L821 Other seborrheic keratosis: Secondary | ICD-10-CM | POA: Diagnosis not present

## 2021-03-30 DIAGNOSIS — D1801 Hemangioma of skin and subcutaneous tissue: Secondary | ICD-10-CM | POA: Diagnosis not present

## 2021-03-30 DIAGNOSIS — D2262 Melanocytic nevi of left upper limb, including shoulder: Secondary | ICD-10-CM | POA: Diagnosis not present

## 2021-03-30 DIAGNOSIS — D225 Melanocytic nevi of trunk: Secondary | ICD-10-CM | POA: Diagnosis not present

## 2021-03-30 DIAGNOSIS — Z85820 Personal history of malignant melanoma of skin: Secondary | ICD-10-CM | POA: Diagnosis not present

## 2021-03-30 DIAGNOSIS — D2272 Melanocytic nevi of left lower limb, including hip: Secondary | ICD-10-CM | POA: Diagnosis not present

## 2021-04-29 ENCOUNTER — Encounter: Payer: Self-pay | Admitting: Adult Health

## 2021-04-29 ENCOUNTER — Other Ambulatory Visit: Payer: Self-pay

## 2021-04-29 ENCOUNTER — Ambulatory Visit (INDEPENDENT_AMBULATORY_CARE_PROVIDER_SITE_OTHER): Payer: Medicare HMO | Admitting: Adult Health

## 2021-04-29 VITALS — BP 150/120 | HR 92 | Temp 98.4°F | Ht 71.0 in | Wt 173.0 lb

## 2021-04-29 DIAGNOSIS — R4701 Aphasia: Secondary | ICD-10-CM | POA: Diagnosis not present

## 2021-04-29 DIAGNOSIS — I10 Essential (primary) hypertension: Secondary | ICD-10-CM

## 2021-04-29 LAB — CBC WITH DIFFERENTIAL/PLATELET
Absolute Monocytes: 893 cells/uL (ref 200–950)
Basophils Absolute: 87 cells/uL (ref 0–200)
Basophils Relative: 1.1 %
Eosinophils Absolute: 221 cells/uL (ref 15–500)
Eosinophils Relative: 2.8 %
HCT: 52.1 % — ABNORMAL HIGH (ref 38.5–50.0)
Hemoglobin: 16.9 g/dL (ref 13.2–17.1)
Lymphs Abs: 2489 cells/uL (ref 850–3900)
MCH: 28.7 pg (ref 27.0–33.0)
MCHC: 32.4 g/dL (ref 32.0–36.0)
MCV: 88.5 fL (ref 80.0–100.0)
MPV: 9.9 fL (ref 7.5–12.5)
Monocytes Relative: 11.3 %
Neutro Abs: 4211 cells/uL (ref 1500–7800)
Neutrophils Relative %: 53.3 %
Platelets: 235 10*3/uL (ref 140–400)
RBC: 5.89 10*6/uL — ABNORMAL HIGH (ref 4.20–5.80)
RDW: 13.4 % (ref 11.0–15.0)
Total Lymphocyte: 31.5 %
WBC: 7.9 10*3/uL (ref 3.8–10.8)

## 2021-04-29 MED ORDER — AMLODIPINE BESYLATE 2.5 MG PO TABS
2.5000 mg | ORAL_TABLET | Freq: Every day | ORAL | 0 refills | Status: DC
Start: 2021-04-29 — End: 2021-07-12

## 2021-04-29 NOTE — Progress Notes (Signed)
Subjective:    Patient ID: Robert Mann, male    DOB: 11/27/1951, 70 y.o.   MRN: [id number]  HPI 70 year old male who  has a past medical history of High blood pressure, History of kidney stones, History of placement of chest tube (10/1979), Hyperlipidemia, Melanoma ([place]) (01/2018), and Pneumothorax (1980).  He presents to the office today with his wife. He was last seen in 2020 after we changed his BP medication.   Today he presents for referral for Neuropsych testing. He reports that over the last two years he has been experiencing worsening forgetfulness and word finding issues.  Per patient report he will be in the middle of a conversation and will suddenly forget what he was trying to say.  This has been happening more frequently to the point where he feels as though its time to find out what is causing his issues.  He denies getting lost while driving, forgetting where he placed items around the house or forgetting family members names.   Review of Systems See HPI   Past Medical History:  Diagnosis Date  . High blood pressure    has "white coat syndrome"/ no meds  . History of kidney stones   . History of placement of chest tube 10/1979   36 yrs. ago  . Hyperlipidemia   . Melanoma ([place]) 01/2018   left chest  . Pneumothorax 1980   30 + years ago     Social History   Socioeconomic History  . Marital status: Married    Spouse name: Not on file  . Number of children: 2  . Years of education: Not on file  . Highest education level: Not on file  Occupational History  . Occupation: part time   Tobacco Use  . Smoking status: Former Smoker    Quit date: 08/08/1980    Years since quitting: 40.7  . Smokeless tobacco: Never Used  . Tobacco comment: Quit smoking cigarettes in 1981  Vaping Use  . Vaping Use: Never used  Substance and Sexual Activity  . Alcohol use: Yes    Comment: social   . Drug use: No  . Sexual activity: Not on file  Other Topics Concern  .  Not on file  Social History Narrative   Working PT; job requires a tremendous amount of walking   Married    3 children   Social Determinants of Radio broadcast assistant Strain: Not on file  Food Insecurity: Not on file  Transportation Needs: Not on file  Physical Activity: Not on file  Stress: Not on file  Social Connections: Not on file  Intimate Partner Violence: Not on file    Past Surgical History:  Procedure Laterality Date  . MELANOMA EXCISION WITH SENTINEL LYMPH NODE BIOPSY Left 02/08/2018   Procedure: WIDE EXCISION OF LEFT CHEST MELANOMA AND LEFT AXILLARY SENTINEL LYMPH NODE;  Surgeon: Georganna Skeans, MD;  Location: [facility];  Service: General;  Laterality: Left;  . WISDOM TOOTH EXTRACTION      Family History  Problem Relation Age of Onset  . Hypertension Mother   . Stroke Mother   . Hyperlipidemia Mother   . Heart disease Mother   . Hypertension Father   . Heart attack Father   . Heart disease Father   . Hyperlipidemia Father   . Cancer Brother 39       Glioblastoma     Allergies  Allergen Reactions  . Lisinopril Cough    Current Outpatient  Medications on File Prior to Visit  Medication Sig Dispense Refill  . Cholecalciferol (VITAMIN D3) 20 MCG TABS Take 25 mcg by mouth daily.    . Coenzyme Q10 (CO Q 10 PO) Take 300 mg by mouth daily.     . Cyanocobalamin (VITAMIN B 12 PO) Take by mouth.    . fexofenadine (ALLEGRA ODT) 30 MG disintegrating tablet Take 30 mg by mouth daily.    . Multiple Vitamins-Minerals (CENTRUM SILVER PO) Take 1 tablet by mouth daily.    . naproxen sodium (ALEVE) 220 MG tablet Take 220 mg by mouth as needed.    . Omega-3 Fatty Acids (FISH OIL PO) Take 1 capsule by mouth daily.    . Glucosamine-Chondroit-Vit C-Mn (GLUCOSAMINE CHONDR 1500 COMPLX PO) Take 1 tablet by mouth daily. (Patient not taking: Reported on 04/29/2021)     Current Facility-Administered Medications on File Prior to Visit  Medication Dose Route Frequency Provider Last  Rate Last Admin  . 0.9 %  sodium chloride infusion  500 mL Intravenous Once Nandigam, Kavitha V, MD        BP (!) 150/120   Pulse 92   Temp 98.4 F (36.9 C) (Oral)   Ht 5\' 11"  (1.803 m)   Wt 173 lb (78.5 kg)   SpO2 95%   BMI 24.13 kg/m       Objective:   Physical Exam Vitals and nursing note reviewed.  Constitutional:      Appearance: Normal appearance.  Cardiovascular:     Rate and Rhythm: Normal rate and regular rhythm.     Pulses: Normal pulses.     Heart sounds: Normal heart sounds.  Skin:    General: Skin is warm and dry.     Capillary Refill: Capillary refill takes less than 2 seconds.  Neurological:     General: No focal deficit present.     Mental Status: He is alert and oriented to person, place, and time.  Psychiatric:        Mood and Affect: Mood normal.        Behavior: Behavior normal.        Thought Content: Thought content normal.        Cognition and Memory: Cognition normal.        Judgment: Judgment normal.     Comments: Obvious forgetfulness and word finding issues during conversation in the office today.  He would often be in the middle of a sentence forget what he was going to say next.       Assessment & Plan:  1. Aphasia  - CBC with Differential/Platelet; Future - Basic Metabolic Panel; Future - TSH; Future - Vitamin B12; Future - MR Brain Wo Contrast; Future - Ambulatory referral to Neurology - Vitamin B12 - TSH - Basic Metabolic Panel - CBC with Differential/Platelet - TSH; Future - Basic Metabolic Panel; Future - CBC (no diff); Future - Vitamin B12; Future - Basic Metabolic Panel - CBC (no diff) - Vitamin B12 - TSH  2. Essential hypertension - Restart on blood pressure medication. Follow up in one month  - amLODipine (NORVASC) 2.5 MG tablet; Take 1 tablet (2.5 mg total) by mouth daily.  Dispense: 90 tablet; Refill: 0  Dorothyann Peng, NP

## 2021-04-29 NOTE — Patient Instructions (Addendum)
It was great seeing you today   I have referred you over to Neuropsych to see Thedore Mins   We will get some labs today and do an MRI   I have also restarted you on your blood pressure medication  Please follow up in one month

## 2021-04-30 LAB — BASIC METABOLIC PANEL
BUN/Creatinine Ratio: 23 (calc) — ABNORMAL HIGH (ref 6–22)
BUN: 27 mg/dL — ABNORMAL HIGH (ref 7–25)
CO2: 23 mmol/L (ref 20–32)
Calcium: 9.5 mg/dL (ref 8.6–10.3)
Chloride: 107 mmol/L (ref 98–110)
Creat: 1.19 mg/dL (ref 0.70–1.25)
Glucose, Bld: 97 mg/dL (ref 65–99)
Potassium: 4.1 mmol/L (ref 3.5–5.3)
Sodium: 141 mmol/L (ref 135–146)

## 2021-04-30 LAB — VITAMIN B12: Vitamin B-12: >2000 pg/mL — ABNORMAL HIGH (ref 200–1100)

## 2021-04-30 LAB — TSH: TSH: 2 mIU/L (ref 0.40–4.50)

## 2021-05-03 ENCOUNTER — Encounter: Payer: Self-pay | Admitting: Psychology

## 2021-05-11 DIAGNOSIS — D485 Neoplasm of uncertain behavior of skin: Secondary | ICD-10-CM | POA: Diagnosis not present

## 2021-05-11 DIAGNOSIS — L988 Other specified disorders of the skin and subcutaneous tissue: Secondary | ICD-10-CM | POA: Diagnosis not present

## 2021-05-19 ENCOUNTER — Telehealth: Payer: Self-pay | Admitting: Adult Health

## 2021-05-19 NOTE — Telephone Encounter (Signed)
Left message for patient to call back and schedule Medicare Annual Wellness Visit (AWV) either virtually or in office.   Last AWVI 12/31/19  please schedule at anytime with [facility] Nurse Health Advisor 1 or 2   This should be a 45 minute visit.

## 2021-05-24 ENCOUNTER — Telehealth: Payer: Self-pay

## 2021-05-24 NOTE — Progress Notes (Deleted)
Subjective:   Robert Mann is a 70 y.o. male who presents for an Initial Medicare Annual Wellness Visit.   I connected with Trena Platt today by telephone and verified that I am speaking with the correct person using two identifiers. Location patient: home Location provider: work Persons participating in the virtual visit: patient, provider.   I discussed the limitations, risks, security and privacy concerns of performing an evaluation and management service by telephone and the availability of in person appointments. I also discussed with the patient that there may be a patient responsible charge related to this service. The patient expressed understanding and verbally consented to this telephonic visit.    Interactive audio and video telecommunications were attempted between this provider and patient, however failed, due to patient having technical difficulties OR patient did not have access to video capability.  We continued and completed visit with audio only.     Review of Systems    ***       Objective:    There were no vitals filed for this visit. There is no height or weight on file to calculate BMI.  Advanced Directives 12/31/2019 03/27/2018 02/08/2018  Does Patient Have a Medical Advance Directive? Yes Yes Yes  Type of Paramedic of [place];Living will [place];Living will [place];Living will  Does patient want to make changes to medical advance directive? No - Patient declined - No - Patient declined  Copy of [place] in Chart? No - copy requested No - copy requested No - copy requested    Current Medications (verified) Outpatient Encounter Medications as of 05/24/2021  Medication Sig   amLODipine (NORVASC) 2.5 MG tablet Take 1 tablet (2.5 mg total) by mouth daily.   Cholecalciferol (VITAMIN D3) 20 MCG TABS Take 25 mcg by mouth daily.   Coenzyme Q10 (CO Q 10 PO) Take 300 mg  by mouth daily.    Cyanocobalamin (VITAMIN B 12 PO) Take by mouth.   fexofenadine (ALLEGRA ODT) 30 MG disintegrating tablet Take 30 mg by mouth daily.   Glucosamine-Chondroit-Vit C-Mn (GLUCOSAMINE CHONDR 1500 COMPLX PO) Take 1 tablet by mouth daily. (Patient not taking: Reported on 04/29/2021)   Multiple Vitamins-Minerals (CENTRUM SILVER PO) Take 1 tablet by mouth daily.   naproxen sodium (ALEVE) 220 MG tablet Take 220 mg by mouth as needed.   Omega-3 Fatty Acids (FISH OIL PO) Take 1 capsule by mouth daily.   Facility-Administered Encounter Medications as of 05/24/2021  Medication   0.9 %  sodium chloride infusion    Allergies (verified) Lisinopril   History: Past Medical History:  Diagnosis Date   High blood pressure    has "white coat syndrome"/ no meds   History of kidney stones    History of placement of chest tube 10/1979   36 yrs. ago   Hyperlipidemia    Melanoma ([place]) 01/2018   left chest   Pneumothorax 1980   30 + years ago    Past Surgical History:  Procedure Laterality Date   MELANOMA EXCISION WITH SENTINEL LYMPH NODE BIOPSY Left 02/08/2018   Procedure: WIDE EXCISION OF LEFT CHEST MELANOMA AND LEFT AXILLARY SENTINEL LYMPH NODE;  Surgeon: Georganna Skeans, MD;  Location: [place];  Service: General;  Laterality: Left;   WISDOM TOOTH EXTRACTION     Family History  Problem Relation Age of Onset   Hypertension Mother    Stroke Mother    Hyperlipidemia Mother    Heart disease  Mother    Hypertension Father    Heart attack Father    Heart disease Father    Hyperlipidemia Father    Cancer Brother 76       Glioblastoma    Social History   Socioeconomic History   Marital status: Married    Spouse name: Not on file   Number of children: 2   Years of education: Not on file   Highest education level: Not on file  Occupational History   Occupation: part time   Tobacco Use   Smoking status: Former    Pack years: 0.00    Types: Cigarettes    Quit date: 08/08/1980     Years since quitting: 40.8   Smokeless tobacco: Never   Tobacco comments:    Quit smoking cigarettes in 1981  Vaping Use   Vaping Use: Never used  Substance and Sexual Activity   Alcohol use: Yes    Comment: social    Drug use: No   Sexual activity: Not on file  Other Topics Concern   Not on file  Social History Narrative   Working PT; job requires a tremendous amount of walking   Married    3 children   Social Determinants of Radio broadcast assistant Strain: Not on file  Food Insecurity: Not on file  Transportation Needs: Not on file  Physical Activity: Not on file  Stress: Not on file  Social Connections: Not on file    Tobacco Counseling Counseling given: Not Answered Tobacco comments: Quit smoking cigarettes in [place]:                 Diabetic?***         Activities of Daily Living In your present state of health, do you have any difficulty performing the following activities: 04/29/2021  Hearing? N  Vision? N  Difficulty concentrating or making decisions? N  Walking or climbing stairs? N  Dressing or bathing? N  Doing errands, shopping? N  Some recent data might be hidden    Patient Care Team: Dorothyann Peng, NP as PCP - General (Family Medicine)  Indicate any recent Medical Services you may have received from other than [facility] providers in the past year (date may be approximate).     Assessment:   This is a routine wellness examination for Robert Mann.  Hearing/Vision screen No results found.  Dietary issues and exercise activities discussed:     Goals Addressed   None    Depression Screen PHQ 2/9 Scores 04/29/2021 12/31/2019  PHQ - 2 Score 0 0    Fall Risk Fall Risk  04/29/2021 12/31/2019  Falls in the past year? 0 0  Number falls in past yr: 0 -  Injury with Fall? 0 -  Follow up - Falls evaluation completed;Education provided;Falls prevention discussed    FALL RISK PREVENTION PERTAINING TO THE HOME:  Any stairs  in or around the home? {YES/NO:21197} If so, are there any without handrails? {YES/NO:21197} Home free of loose throw rugs in walkways, pet beds, electrical cords, etc? {YES/NO:21197} Adequate lighting in your home to reduce risk of falls? {YES/NO:21197}  ASSISTIVE DEVICES UTILIZED TO PREVENT FALLS:  Life alert? {YES/NO:21197} Use of a cane, walker or w/c? {YES/NO:21197} Grab bars in the bathroom? {YES/NO:21197} Shower chair or bench in shower? {YES/NO:21197} Elevated toilet seat or a handicapped toilet? {YES/NO:21197}  TIMED UP AND GO:  Was the test performed? {YES/NO:21197}.  Length of time to ambulate 10 feet: *** sec.   {  Appearance of UGQB:1694503}  Cognitive Function:     6CIT Screen 12/31/2019  What Year? 0 points  What month? 0 points  What time? 0 points  Count back from 20 0 points  Months in reverse 0 points  Repeat phrase 0 points  Total Score 0    Immunizations Immunization History  Administered Date(s) Administered   Fluad Quad(high Dose 65+) 08/15/2019   Influenza, High Dose Seasonal PF 08/14/2018   Pneumococcal Conjugate-13 07/31/2018   Pneumococcal Polysaccharide-23 08/15/2019   Tdap 07/29/2015   Zoster, Live 03/27/2016    {TDAP status:2101805}  {Flu Vaccine status:2101806}  {Pneumococcal vaccine status:2101807}  {Covid-19 vaccine status:2101808}  Qualifies for Shingles Vaccine? {YES/NO:21197}  Zostavax completed {YES/NO:21197}  {Shingrix Completed?:2101804}  Screening Tests Health Maintenance  Topic Date Due   COVID-19 Vaccine (1) Never done   Zoster Vaccines- Shingrix (1 of 2) Never done   INFLUENZA VACCINE  06/27/2021   TETANUS/TDAP  07/28/2025   COLONOSCOPY (Pts 45-92yrs Insurance coverage will need to be confirmed)  09/04/2028   Hepatitis C Screening  Completed   PNA vac Low Risk Adult  Completed   HPV VACCINES  Aged Out    Health Maintenance  Health Maintenance Due  Topic Date Due   COVID-19 Vaccine (1) Never done   Zoster  Vaccines- Shingrix (1 of 2) Never done    {Colorectal cancer screening:2101809}  Lung Cancer Screening: (Low Dose CT Chest recommended if Age 68-80 years, 30 pack-year currently smoking OR have quit w/in 15years.) {DOES NOT does:27190::"does not"} qualify.   Lung Cancer Screening Referral: ***  Additional Screening:  Hepatitis C Screening: {DOES NOT does:27190::"does not"} qualify; Completed ***  Vision Screening: Recommended annual ophthalmology exams for early detection of glaucoma and other disorders of the eye. Is the patient up to date with their annual eye exam?  {YES/NO:21197} Who is the provider or what is the name of the office in which the patient attends annual eye exams? *** If pt is not established with a provider, would they like to be referred to a provider to establish care? {YES/NO:21197}.   Dental Screening: Recommended annual dental exams for proper oral hygiene  Community Resource Referral / Chronic Care Management: CRR required this visit?  {YES/NO:21197}  CCM required this visit?  {YES/NO:21197}     Plan:     I have personally reviewed and noted the following in the patient's chart:   Medical and social history Use of alcohol, tobacco or illicit drugs  Current medications and supplements including opioid prescriptions. {Opioid [phone]} Functional ability and status Nutritional status Physical activity Advanced directives List of other physicians Hospitalizations, surgeries, and ER visits in previous 12 months Vitals Screenings to include cognitive, depression, and falls Referrals and appointments  In addition, I have reviewed and discussed with patient certain preventive protocols, quality metrics, and best practice recommendations. A written personalized care plan for preventive services as well as general preventive health recommendations were provided to patient.     Randel Pigg, LPN   8/88/2800   Nurse Notes:  ***

## 2021-05-24 NOTE — Telephone Encounter (Signed)
Called pt for 945  am [place] appointment with no answer left a voice mail to call back by 1000 am or can reschedule at his convince.. No return call by 100am.    Kathaleen Maser

## 2021-06-02 ENCOUNTER — Other Ambulatory Visit: Payer: Self-pay

## 2021-06-03 ENCOUNTER — Encounter: Payer: Self-pay | Admitting: Adult Health

## 2021-06-03 ENCOUNTER — Ambulatory Visit (INDEPENDENT_AMBULATORY_CARE_PROVIDER_SITE_OTHER): Payer: Medicare HMO | Admitting: Adult Health

## 2021-06-03 VITALS — BP 140/92 | HR 87 | Temp 97.7°F | Ht 71.0 in | Wt 174.0 lb

## 2021-06-03 DIAGNOSIS — I10 Essential (primary) hypertension: Secondary | ICD-10-CM

## 2021-06-03 MED ORDER — BLOOD PRESSURE MONITOR/M CUFF MISC: 0 refills | Status: DC

## 2021-06-03 NOTE — Patient Instructions (Addendum)
Your blood pressure is improving   I am going to have you increase your Norvasc to 5 mg ( two tabs) once a day.   Monitor your blood pressure at home twice a day and send me your results via mychart

## 2021-06-03 NOTE — Progress Notes (Signed)
Subjective:    Patient ID: Robert Mann, male    DOB: 1951-03-12, 70 y.o.   MRN: [id number]  HPI  70 year old male who  has a past medical history of High blood pressure, History of kidney stones, History of placement of chest tube (10/1979), Hyperlipidemia, Melanoma ([place]) (01/2018), and Pneumothorax (1980).  He presents to the office today for one month follow up regarding HTN. During his last visit he was restarted on Norvasc 2.5 mg. He has been monitoring his BP at home with readings in the 702'O systolic. He is not sure how accurate his cuff is since it is about 69 years old. Denies side effects of medication.  Has not experienced dizziness, lightheadedness, chest pain, or shortness of breath  BP Readings from Last 3 Encounters:  06/03/21 (!) 140/92  04/29/21 (!) 150/120  12/31/19 132/75    Review of Systems See HPI   Past Medical History:  Diagnosis Date   High blood pressure    has "white coat syndrome"/ no meds   History of kidney stones    History of placement of chest tube 10/1979   36 yrs. ago   Hyperlipidemia    Melanoma ([place]) 01/2018   left chest   Pneumothorax 1980   30 + years ago     Social History   Socioeconomic History   Marital status: Married    Spouse name: Not on file   Number of children: 2   Years of education: Not on file   Highest education level: Not on file  Occupational History   Occupation: part time   Tobacco Use   Smoking status: Former    Pack years: 0.00    Types: Cigarettes    Quit date: 08/08/1980    Years since quitting: 40.8   Smokeless tobacco: Never   Tobacco comments:    Quit smoking cigarettes in 1981  Vaping Use   Vaping Use: Never used  Substance and Sexual Activity   Alcohol use: Yes    Comment: social    Drug use: No   Sexual activity: Not on file  Other Topics Concern   Not on file  Social History Narrative   Working PT; job requires a tremendous amount of walking   Married    3 children   Social  Determinants of Radio broadcast assistant Strain: Not on file  Food Insecurity: Not on file  Transportation Needs: Not on file  Physical Activity: Not on file  Stress: Not on file  Social Connections: Not on file  Intimate Partner Violence: Not on file    Past Surgical History:  Procedure Laterality Date   [place] Left 02/08/2018   Procedure: WIDE EXCISION OF LEFT CHEST MELANOMA AND LEFT AXILLARY SENTINEL LYMPH NODE;  Surgeon: Georganna Skeans, MD;  Location: [facility];  Service: General;  Laterality: Left;   WISDOM TOOTH EXTRACTION      Family History  Problem Relation Age of Onset   Hypertension Mother    Stroke Mother    Hyperlipidemia Mother    Heart disease Mother    Hypertension Father    Heart attack Father    Heart disease Father    Hyperlipidemia Father    Cancer Brother 15       Glioblastoma     Allergies  Allergen Reactions   Lisinopril Cough    Current Outpatient Medications on File Prior to Visit  Medication Sig Dispense Refill   amLODipine (  NORVASC) 2.5 MG tablet Take 1 tablet (2.5 mg total) by mouth daily. 90 tablet 0   Cholecalciferol (VITAMIN D3) 20 MCG TABS Take 25 mcg by mouth daily.     Coenzyme Q10 (CO Q 10 PO) Take 300 mg by mouth daily.      fexofenadine (ALLEGRA ODT) 30 MG disintegrating tablet Take 30 mg by mouth daily.     Glucosamine-Chondroit-Vit C-Mn (GLUCOSAMINE CHONDR 1500 COMPLX PO) Take 1 tablet by mouth daily.     Multiple Vitamins-Minerals (CENTRUM SILVER PO) Take 1 tablet by mouth daily.     naproxen sodium (ALEVE) 220 MG tablet Take 220 mg by mouth as needed.     Omega-3 Fatty Acids (FISH OIL PO) Take 1 capsule by mouth daily.     Current Facility-Administered Medications on File Prior to Visit  Medication Dose Route Frequency Provider Last Rate Last Admin   0.9 %  sodium chloride infusion  500 mL Intravenous Once Nandigam, Kavitha V, MD        BP (!) 140/92   Pulse 87   Temp 97.7 F  (36.5 C) (Oral)   Ht 5\' 11"  (1.803 m)   Wt 174 lb (78.9 kg)   SpO2 94%   BMI 24.27 kg/m       Objective:   Physical Exam Vitals and nursing note reviewed.  Constitutional:      Appearance: Normal appearance.  Musculoskeletal:        General: Normal range of motion.     Right lower leg: No edema.     Left lower leg: No edema.  Skin:    General: Skin is warm and dry.  Neurological:     General: No focal deficit present.     Mental Status: He is alert and oriented to person, place, and time.  Psychiatric:        Mood and Affect: Mood normal.        Behavior: Behavior normal.        Thought Content: Thought content normal.        Judgment: Judgment normal.      Assessment & Plan:   1. Essential hypertension -We will have him increase his home dose of Norvasc from 2.5 mg to 5 mg.  Monitor blood pressure at home.  He will send me his results via MyChart in the next 2 weeks. - Blood Pressure Monitoring (BLOOD PRESSURE MONITOR/M CUFF) MISC; One cuff for management of hypertension  Dispense: 1 each; Refill: 0  Dorothyann Peng, NP

## 2021-06-22 ENCOUNTER — Encounter: Payer: Self-pay | Admitting: Adult Health

## 2021-06-23 NOTE — Telephone Encounter (Signed)
FYI

## 2021-07-11 ENCOUNTER — Encounter: Payer: Self-pay | Admitting: Adult Health

## 2021-07-12 ENCOUNTER — Other Ambulatory Visit: Payer: Self-pay | Admitting: Adult Health

## 2021-07-12 DIAGNOSIS — I10 Essential (primary) hypertension: Secondary | ICD-10-CM

## 2021-07-12 MED ORDER — LOSARTAN POTASSIUM 25 MG PO TABS: 25.0000 mg | ORAL_TABLET | Freq: Every day | ORAL | 0 refills | Status: DC

## 2021-07-12 MED ORDER — AMLODIPINE BESYLATE 5 MG PO TABS: 5.0000 mg | ORAL_TABLET | Freq: Every day | ORAL | 3 refills | Status: DC

## 2021-07-25 ENCOUNTER — Other Ambulatory Visit: Payer: Self-pay | Admitting: Adult Health

## 2021-07-25 DIAGNOSIS — I10 Essential (primary) hypertension: Secondary | ICD-10-CM

## 2021-08-17 ENCOUNTER — Encounter: Payer: Medicare HMO | Admitting: Counselor

## 2021-08-18 ENCOUNTER — Other Ambulatory Visit: Payer: Self-pay

## 2021-08-18 ENCOUNTER — Ambulatory Visit
Admission: RE | Admit: 2021-08-18 | Discharge: 2021-08-18 | Disposition: A | Discharge status: Home / Self Care | Payer: Medicare HMO | Source: Ambulatory Visit | Attending: Adult Health | Admitting: Adult Health

## 2021-08-18 DIAGNOSIS — R4701 Aphasia: Secondary | ICD-10-CM | POA: Diagnosis not present

## 2021-08-25 ENCOUNTER — Ambulatory Visit (INDEPENDENT_AMBULATORY_CARE_PROVIDER_SITE_OTHER): Payer: Medicare HMO | Admitting: Psychology

## 2021-08-25 ENCOUNTER — Ambulatory Visit: Payer: Medicare HMO | Admitting: Psychology

## 2021-08-25 ENCOUNTER — Encounter: Payer: Self-pay | Admitting: Psychology

## 2021-08-25 ENCOUNTER — Other Ambulatory Visit: Payer: Self-pay

## 2021-08-25 DIAGNOSIS — F0280 Dementia in other diseases classified elsewhere without behavioral disturbance: Secondary | ICD-10-CM | POA: Diagnosis not present

## 2021-08-25 DIAGNOSIS — I10 Essential (primary) hypertension: Secondary | ICD-10-CM | POA: Insufficient documentation

## 2021-08-25 DIAGNOSIS — Z87442 Personal history of urinary calculi: Secondary | ICD-10-CM | POA: Insufficient documentation

## 2021-08-25 DIAGNOSIS — G3101 Pick's disease: Secondary | ICD-10-CM | POA: Diagnosis not present

## 2021-08-25 DIAGNOSIS — E785 Hyperlipidemia, unspecified: Secondary | ICD-10-CM | POA: Insufficient documentation

## 2021-08-25 DIAGNOSIS — R4189 Other symptoms and signs involving cognitive functions and awareness: Secondary | ICD-10-CM

## 2021-08-25 DIAGNOSIS — G3184 Mild cognitive impairment, so stated: Secondary | ICD-10-CM

## 2021-08-25 NOTE — Progress Notes (Addendum)
NEUROPSYCHOLOGICAL EVALUATION [place]. [facility] Department of Neurology  Date of Evaluation: August 25, 2021  Reason for Referral:   Tate Zagal is a 70 y.o. right-handed Caucasian male referred by  Dorothyann Peng, NP , to characterize his current cognitive functioning and assist with diagnostic clarity and treatment planning in the context of subjective cognitive decline.   Assessment and Plan:   Clinical Impression(s): Robert Mann' pattern of performance is suggestive of diffuse cognitive impairment relative to age-matched peers. He did exhibit appropriate performances across visuospatial abilities and confrontation naming. However, all other assessed domains exhibited severe cognitive impairment. This included processing speed, attention/concentration, executive functioning, receptive and expressive language, and all aspects of learning and memory. Mr. Skillman denied difficulties completing instrumental activities of daily living (ADLs) independently and continues to work several days per week. This is quite surprising given the extent of ongoing impairment and he may be benefiting from increased structure and routine in his day-to-day life. Overall, he is likely on the border between mild ("mild cognitive impairment") and major neurocognitive disorder ("dementia") designations. While reported functional independence would suggest a severe presentation of the former, cognitive impairment is far more suggestive of a milder dementia presentation at the present time.  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 This neurodegenerative disease process is characterized by pronounced impairment with language and executive dysfunction when in earlier stages. While Mr. Erny showed relatively diffuse impairment, these areas arguably exhibited greatest impairment. In particular, the  non-fluent PPA subtype is characterized by the evolution of significant word finding difficulties and halting, effortful speech. These symptoms were very prevalent during the current evaluation, as well as reported by Mr. Seybold and his wife as being present and worsening over the past 1-2 years. The logopenic subtype could also be considered given language dysfunction with significant memory impairment as this subtype often shares pathology with Alzheimer's disease. However, his degree of effortful speech is more characteristic of the non-fluent subtype.   Specific to Alzheimer's disease, Mr. Shelp did show significant memory impairment across testing. However, it is worth highlighting that he exhibited appropriate retention scores across 3/4 memory tasks. This would suggest that greater memory difficulty is across encoding (i.e., learning) and retrieval aspects of memory, likely worsened by profound deficits in receptive and expressive language. Strong retention scores would not suggest a primary memory storage deficit, which would be inconsistent with typically presenting Alzheimer's disease. While a PPA presentation appears more likely, Alzheimer's disease cannot be ruled out and should remain on his differential. While there appears to be confusion surrounding if a lacunar infarct was present in the pons based upon his recent MRI, current symptoms and level of impairment would certainly not result from this small of an infarct if indeed present.   I do not see compelling evidence for Lewy body dementia, the behavioral variant of frontotemporal dementia, vascular dementia, more rare parkinsonian presentations, or the semantic dementia PPA variant at the present time. Continued medical monitoring will be important moving forward.  Recommendations: It does not appear that Mr. Downard is being followed by a neurologist at the present time. I will place a referral for him.  When meeting with his  neurologist, I would recommend that they discuss neurological medications which may slow decline. It is important to highlight that these medications may slow functional decline in some individuals. Unfortunately, there is no current treatment which is able to stop or reverse  these changes. To aid with further diagnostic clarity, they could also discuss the pros and cons of an FDG-PET scan as this can yield helpful information in cases such as this.   When asked about witnessed breath cessation, his wife responded with a resounding "yes," also stating "he has apnea." They denied him ever completing a formal sleep study to diagnose obstructive sleep apnea. I would suggest that they discuss the pros and cons of a formal laboratory sleep study with Mr. Carlisle Cater. If present and untreated, sleep apnea will worsen cognitive dysfunction. It will also increase his risk for heart attack, stroke, and further cognitive decline.   A repeat neuropsychological evaluation in 12-18 months (or sooner if functional decline is noted) is recommended to assess the trajectory of future cognitive decline should it occur. This will also aid in future efforts towards improved diagnostic clarity.  Should there be a progression of his current deficits over time, Mr. Wojciak is unlikely to regain any independent living skills lost. Therefore, it is recommended that he remain as involved as possible in all aspects of household chores, finances, and medication management, with supervision to ensure adequate performance. He will likely benefit from the establishment and maintenance of a routine in order to maximize his functional abilities over time.  It will be important for Mr. Ravenscroft to have another person with him when in situations where he may need to process information, weigh the pros and cons of different options, and make decisions, in order to ensure that he fully understands and recalls all information to be considered.  If  not already done, Mr. Yeatts and his family may want to discuss his wishes regarding durable power of attorney and medical decision making, so that he can have input into these choices. Additionally, they may wish to discuss future plans for caretaking and seek out community options for in home/residential care should they become necessary.  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 Admittedly, performance across neurocognitive testing is not a strong predictor of an individual's safety operating a motor vehicle. However, there are safety-related concerns given the extent of impairment surrounding processing speed, attention/concentration, and executive functioning. Should his family wish to pursue a formalized driving evaluation, they would be encouraged to contact [employer] in [place], [place] at [phone]. Another option would be through [facility]; however, the latter would likely require a referral from a medical doctor. [facility] can be reached directly at [phone].   Important information to remember should be provided in written formats in all instances.   He had difficulty with receptive language, particularly when presented in complex sentence structures. Presenting information it in short, concrete sentences will be helpful to ensure his comprehension.  Given deficits in expressive language, it will be difficult for him to provide lengthy responses to inquiries. However, he was able to produce short phrases accurately. Therefore, providing him with written multiple choice options when asking questions or asking closed questions which require only brief responses will maximize his ability to communicate effectively.  Review of Records:   Mr. Britten was seen by [facility] Dorothyann Peng, NP) on 04/29/2021 for follow-up. He had bene previously seen back in 2020 surrounding changes to blood pressure medication.  During the current appointment, he expressed concerns surrounding increased forgetfulness and word finding difficulties. He reported that difficulties seemed to be increasing in frequency, thus increasing concerns. Trouble with ADLs was denied, as was trouble misplacing  objects or forgetting names of familiar individuals. Ultimately, Mr. Weida was referred for a comprehensive neuropsychological evaluation to characterize his cognitive abilities and to assist with diagnostic clarity and treatment planning.   Brain MRI on 08/18/2021 revealed moderate generalized cerebral atrophy with comparatively mild cerebellar atrophy. There was also a lack of clarity between prominent perivascular space versus a potential lacunar infarct within the central pons. No acute abnormalities were observed.  Past Medical History:  Diagnosis Date   Essential hypertension    History of kidney stones    History of melanoma 01/2018   left chest   History of placement of chest tube 10/1979   History of pneumothorax 1980   Hyperlipidemia     Past Surgical History:  Procedure Laterality Date   MELANOMA EXCISION WITH SENTINEL LYMPH NODE BIOPSY Left 02/08/2018   Procedure: WIDE EXCISION OF LEFT CHEST MELANOMA AND LEFT AXILLARY SENTINEL LYMPH NODE;  Surgeon: Georganna Skeans, MD;  Location: [place];  Service: General;  Laterality: Left;   WISDOM TOOTH EXTRACTION      Current Outpatient Medications:    amLODipine (NORVASC) 5 MG tablet, Take 1 tablet (5 mg total) by mouth daily., Disp: 90 tablet, Rfl: 3   Blood Pressure Monitoring (BLOOD PRESSURE MONITOR/M CUFF) MISC, One cuff for management of hypertension, Disp: 1 each, Rfl: 0   Cholecalciferol (VITAMIN D3) 20 MCG TABS, Take 25 mcg by mouth daily., Disp: , Rfl:    Coenzyme Q10 (CO Q 10 PO), Take 300 mg by mouth daily. , Disp: , Rfl:    fexofenadine (ALLEGRA ODT) 30 MG disintegrating tablet, Take 30 mg by mouth daily., Disp: , Rfl:    Glucosamine-Chondroit-Vit C-Mn  (GLUCOSAMINE CHONDR 1500 COMPLX PO), Take 1 tablet by mouth daily., Disp: , Rfl:    losartan (COZAAR) 25 MG tablet, Take 1 tablet (25 mg total) by mouth daily., Disp: 90 tablet, Rfl: 0   Multiple Vitamins-Minerals (CENTRUM SILVER PO), Take 1 tablet by mouth daily., Disp: , Rfl:    naproxen sodium (ALEVE) 220 MG tablet, Take 220 mg by mouth as needed., Disp: , Rfl:    Omega-3 Fatty Acids (FISH OIL PO), Take 1 capsule by mouth daily., Disp: , Rfl:   Current Facility-Administered Medications:    0.9 %  sodium chloride infusion, 500 mL, Intravenous, Once, Nandigam, Venia Minks, MD  Clinical Interview:   The following information was obtained during a clinical interview with Mr. Mancha and his wife prior to cognitive testing.  Cognitive Symptoms: Decreased short-term memory: Endorsed. He reported generalized forgetfulness, as well as more specific difficulties surrounding trouble recalling past conversations or people's names. He also acknowledged some trouble misplacing/losing things. Difficulties were said to be present for the past 12 months and have worsened over that time. His wife stated that difficulties have been worsening at least over the past 18 months.  Decreased long-term memory: Denied. Decreased attention/concentration: Endorsed. He was unable to articulate the full extent of these difficulties and simple stated "I just don't have it." He did later report noted trouble with increased distractibility.  Reduced processing speed: Endorsed. Difficulties with executive functions: Denied. He and his wife also denied trouble with impulsivity or any overt personality changes.  Difficulties with emotion regulation: Denied. Difficulties with receptive language: Denied assuming he is able to hear the source of the sound adequately.  Difficulties with word finding: Endorsed. He reported prominent word finding difficulties which often prevents him from adequately communicating with others.   Decreased visuoperceptual ability: Denied.  Difficulties completing  ADLs: Denied. His wife did add that he seems to need a little bit more navigational assistance while driving but was otherwise in agreement.   Additional Medical History: History of traumatic brain injury/concussion: He reported likely sustaining several head injuries throughout his life. However, while he reported that he "saw stars" on a few occasions, he denied any known losses in consciousness. No persisting deficits from these injuries was reported.  History of stroke: Unclear. Recent neuroimaging was unclear if he experienced a remote lacunar infarct in the central pons or if this better represented as prominent perivascular space.  History of seizure activity: Denied. History of known exposure to toxins: Denied. Symptoms of chronic pain: Denied. Experience of frequent headaches/migraines: Denied. Frequent instances of dizziness/vertigo: Denied.  Sensory changes: While he seemed to downplay hearing loss, his wife did report fairly significant symptoms, stating that she was unsure if he was generally able to hear his grandchildren speak to him. Other sensory changes/difficulties (e.g., vision, taste, smell) were denied.  Balance/coordination difficulties: Denied. Other motor difficulties: Denied.  Sleep History: Estimated hours obtained each night: 7 hours.  Difficulties falling asleep: He reported occasional difficulties falling asleep, generally when his mind is overly active or he is in the midst of a project he feels he needs to complete.  Difficulties staying asleep: Denied. Feels rested and refreshed upon awakening: Endorsed.  History of snoring: Endorsed. History of waking up gasping for air: Endorsed. Witnessed breath cessation while asleep: Endorsed. When asked about witnessed breath cessation, his wife responded with a resounding "yes," also stating "he has apnea." They denied him ever completing a formal  sleep study to diagnose obstructive sleep apnea.   History of vivid dreaming: Endorsed. Excessive movement while asleep: Endorsed. His wife reported very brief (about 10 seconds) periods where he may move his arms and legs while asleep. She also noted some wordless vocalizations. She denied ever getting hit or kicked while asleep.  Instances of acting out his dreams: Denied.  Psychiatric/Behavioral Health History: Depression: Denied. He described his mood as "fine" and denied to his knowledge any prior mental health concerns or diagnoses. His wife was in agreement with this but did add that he may have "slight temper flare-ups," largely due to frustration surrounding word finding difficulties. Current or remote suicidal ideation, intent, or plan was denied.  Anxiety: Denied. Mania: Denied. Trauma History: Denied. Visual/auditory hallucinations: Denied. Delusional thoughts: Denied.  Tobacco: Denied. Alcohol: He reported infrequent alcohol consumption in social settings and denied a history of problematic alcohol abuse or dependence.  Recreational drugs: Denied.  Family History: Problem Relation Age of Onset   Hypertension Mother    Stroke Mother    Hyperlipidemia Mother    Heart disease Mother    Hypertension Father    Heart attack Father    Heart disease Father    Hyperlipidemia Father    Cancer Brother 54       Glioblastoma    This information was confirmed by Robert Mann.  Academic/Vocational History: Highest level of educational attainment: 14 years. He graduated from high school and estimated completing an additional two years of college. He described himself as an average (B) student in academic settings. He reported being well-rounded and did not identify any relative weaknesses.  History of developmental delay: Denied. History of grade repetition: Denied. Enrollment in special education courses: Denied. History of LD/ADHD: Denied.  Employment: He is currently employed  part-time shuttling cars at a local airport. He did report that communication issues have become  noticeable and that colleagues and supervisors know that he has some "troubles."   Evaluation Results:   Behavioral Observations: Robert Mann was accompanied by his wife, arrived to his appointment on time, and was appropriately dressed and groomed. He appeared alert and oriented. Observed gait and station were within normal limits. Gross motor functioning appeared intact upon informal observation and no abnormal movements (e.g., tremors) were noted. His affect was generally relaxed and positive. Spontaneous speech was halting and effortful. Word finding difficulties were apparent to the extent that he was unable to vocalize thoughts or complete sentences at times. Expressed thought processes were generally coherent, organized, and normal in content. Insight into his cognitive difficulties appeared largely adequate. However, he may not be fully aware of the extent of ongoing impairments.   During testing, sustained attention was appropriate. Task engagement was adequate and he persisted when challenged. He frequently required instructions to be repeated by the psychometrist. His affect was flat and word finding difficulties and effortful speech was quite prevalent throughout testing. The latter directly impacted test performances across numerous tasks, especially those requiring verbal responses (WAIS-IV Similarities, NAB Judgment). He also exhibited unexpected responses across a color naming task where he responded with "yellow" and "white" when options were strictly red, blue or green. The cause for this was unknown as he denied trouble with color blindness and this was not a consistent occurrence. Overall, Mr. Broz was cooperative with the clinical interview and subsequent testing procedures.   Adequacy of Effort: The validity of neuropsychological testing is limited by the extent to which the individual  being tested may be assumed to have exerted adequate effort during testing. Robert Mann expressed his intention to perform to the best of his abilities and exhibited adequate task engagement and persistence. Scores across stand-alone and embedded performance validity measures were variable. However, his below expectation performance on an embedded task was likely due to true cognitive impairment rather than attempts to perform poorly or poor engagement. As such, the results of the current evaluation are believed to be a valid representation of Mr. Dauphinais' current cognitive functioning.  Test Results: Mr. Tomaso was largely oriented at the time of the current evaluation. When asked the year, he responded with "22" and was unable to name the current clinic.  Intellectual abilities based upon educational and vocational attainment were estimated to be in the average range. Premorbid abilities were estimated to be within the average range based upon a single-word reading test.   Processing speed was exceptionally low. Basic attention was exceptionally low. More complex attention (e.g., working memory) was well below average. Executive functioning was exceptionally low. Performance on a task assessing safety and judgment was exceptionally low. However, much of those was due to significant word finding difficulties and effortful speech making it very challenging for him to provide responses.   Assessed receptive language abilities were exceptionally low. He lost points across all aspects of this assessment, including sequencing, understanding conceptual questions, understanding complex sentence structure, and following multi-step commands. Assessed expressive language was largely exceptionally low. On a writing task, points were generally lost for him providing very brief, concrete statements (e.g., "The father has tongs."). Only one provided sentence exhibited a notable abnormality (i.e., "The dog is his hot  dog"). Oral production was very slow and he exhibited significant confusion surrounding filling out a check and balancing a checkbook. He did have an isolated strength across confrontation naming.    Assessed visuospatial/visuoconstructional abilities were average to above average. Points were  lost on his drawing of a clock due to him only drawing on clock hand pointing towards the number 10. His copy of a complex figure was appropriate.    Learning (i.e., encoding) of novel verbal and visual information was exceptionally low. Spontaneous delayed recall (i.e., retrieval) of previously learned information was exceptionally low to well below average. Retention rates were 138% across a story learning task, 0% across a list learning task, and 75% across a shape learning task. Performance across recognition tasks was exceptionally low to well below average, suggesting limited evidence for information consolidation.   Results of emotional screening instruments suggested that recent symptoms of generalized anxiety were in the minimal range, while symptoms of depression were within normal limits. A screening instrument assessing recent sleep quality suggested the presence of minimal sleep dysfunction.  Tables of Scores:   Note: This summary of test scores accompanies the interpretive report and should not be considered in isolation without reference to the appropriate sections in the text. Descriptors are based on appropriate normative data and may be adjusted based on clinical judgment. Terms such as "Within Normal Limits" and "Outside Normal Limits" are used when a more specific description of the test score cannot be determined.       Percentile - Normative Descriptor > 98 - Exceptionally High 91-97 - Well Above Average 75-90 - Above Average 25-74 - Average 9-24 - Below Average 2-8 - Well Below Average < 2 - Exceptionally Low       Validity:   DESCRIPTOR       Dot Counting Test: --- --- Within  Normal Limits  NAB EVI: --- --- Outside Normal Limits       Orientation:      Raw Score Percentile   NAB Orientation, Form 1 26/29 --- ---       Cognitive Screening:      Raw Score Percentile   SLUMS: 10/30 --- ---       Intellectual Functioning:      Standard Score Percentile   Test of Premorbid Functioning: 562 13 Average       Memory:     NAB Memory Module, Form 1: Standard Score/ T Score Percentile   Total Memory Index 52 <1 Exceptionally Low  List Learning       Total Trials 1-3 4/36 (19) <1 Exceptionally Low    List B 2/12 (37) 9 Below Average    Short Delay Free Recall 1/12 (25) 1 Exceptionally Low    Long Delay Free Recall 0/12 (27) 1 Exceptionally Low    Retention Percentage 0 (15) <1 Exceptionally Low    Recognition Discriminability 2 (34) 5 Well Below Average  Shape Learning       Total Trials 1-3 7/27 (27) 1 Exceptionally Low    Delayed Recall 3/9 (36) 8 Well Below Average    Retention Percentage 75 (43) 25 Average    Recognition Discriminability 2 (27) 1 Exceptionally Low  Story Learning       Immediate Recall 9/80 (19) <1 Exceptionally Low    Delayed Recall 11/40 (32) 4 Well Below Average    Retention Percentage 138 (74) 99 Exceptionally High  Daily Living Memory       Immediate Recall 8/51 (19) <1 Exceptionally Low    Delayed Recall 5/17 (22) <1 Exceptionally Low    Retention Percentage 125 (76) 99 Exceptionally High    Recognition Hits 6/10 (28) 2 Well Below Average       Attention/Executive Function:  Trail Making Test (TMT): Raw Score (T Score) Percentile     Part A 85 secs.,  1 error (20) <1 Exceptionally Low    Part B Discontinued --- ---         Scaled Score Percentile   WAIS-IV Coding: 3 1 Exceptionally Low       NAB Attention Module, Form 1: T Score Percentile     Digits Forward 21 <1 Exceptionally Low    Digits Backwards 29 2 Well Below Average        Scaled Score Percentile   WAIS-IV Similarities: 5 5 Well Below Average        D-KEFS Color-Word Interference Test: Raw Score (Scaled Score) Percentile     Color Naming 84 secs. (1) <1 Exceptionally Low    Word Reading 40 secs. (2) <1 Exceptionally Low    Inhibition Discontinued --- Impaired    Inhibition/Switching Not Attempted --- ---       NAB Executive Functions Module, Form 1: T Score Percentile     Judgment 19 <1 Exceptionally Low       Language:     Verbal Fluency Test: Raw Score (T Score) Percentile     Phonemic Fluency (FAS) 10 (21) <1 Exceptionally Low    Animal Fluency 5 (14) <1 Exceptionally Low       NAB Language Module, Form 1: Standard Score/ T Score Percentile   Total Score 62 1 Exceptionally Low    Oral Production 28 2 Well Below Average    Reading Comprehension 12/13 --- ---    Auditory Comprehension 19 <1 Exceptionally Low    Naming 30/31 (55) 69 Average    Writing 21 <1 Exceptionally Low    Bill Payment 19 <1 Exceptionally Low       Visuospatial/Visuoconstruction:      Raw Score Percentile   Clock Drawing: 7/10 --- Within Normal Limits       NAB Spatial Module, Form 1: T Score Percentile     Figure Drawing Copy 58 79 Above Average        Scaled Score Percentile   WAIS-IV Block Design: 8 25 Average       Mood and Personality:      Raw Score Percentile   Geriatric Depression Scale: 6 --- Within Normal Limits  Geriatric Anxiety Scale: 2 --- Minimal    Somatic 1 --- Minimal    Cognitive 1 --- Minimal    Affective 0 --- Minimal       Additional Questionnaires:      Raw Score Percentile   PROMIS Sleep Disturbance Questionnaire: 22 --- None to Slight   Informed Consent and Coding/Compliance:   The current evaluation represents a clinical evaluation for the purposes previously outlined by the referral source and is in no way reflective of a forensic evaluation.   Mr. Popov was provided with a verbal description of the nature and purpose of the present neuropsychological evaluation. Also reviewed were the foreseeable risks  and/or discomforts and benefits of the procedure, limits of confidentiality, and mandatory reporting requirements of this provider. The patient was given the opportunity to ask questions and receive answers about the evaluation. Oral consent to participate was provided by the patient.   This evaluation was conducted by Christia Reading, Ph.D., licensed clinical neuropsychologist. Mr. Mansouri completed a clinical interview with Dr. [name], billed as one unit [phone], and 155 minutes of cognitive testing and scoring, billed as one unit [phone] and four additional units [id number]. Psychometrist Milana Kidney, B.S.,  assisted Dr. [name] with test administration and scoring procedures. As a separate and discrete service, Dr. [name] spent a total of 160 minutes in interpretation and report writing billed as one unit [phone] and two units [id number].

## 2021-08-25 NOTE — Progress Notes (Signed)
   Psychometrician Note   Cognitive testing was administered to [employer] by Milana Kidney, B.S. (psychometrist) under the supervision of Dr. Christia Reading, Ph.D., licensed psychologist on 08/25/21. Mr. Kitchings did not appear overtly distressed by the testing session per behavioral observation or responses across self-report questionnaires. Rest breaks were offered.    The battery of tests administered was selected by Dr. Christia Reading, Ph.D. with consideration to Mr. Vidrio current level of functioning, the nature of his symptoms, emotional and behavioral responses during interview, level of literacy, observed level of motivation/effort, and the nature of the referral question. This battery was communicated to the psychometrist. Communication between Dr. Christia Reading, Ph.D. and the psychometrist was ongoing throughout the evaluation and Dr. Christia Reading, Ph.D. was immediately accessible at all times. Dr. Christia Reading, Ph.D. provided supervision to the psychometrist on the date of this service to the extent necessary to assure the quality of all services provided.    Damonie [name] Zalewski will return within approximately 1-2 weeks for an interactive feedback session with Dr. [name] at which time his test performances, clinical impressions, and treatment recommendations will be reviewed in detail. Mr. Levario understands he can contact our office should he require our assistance before this time.  A total of 155 minutes of billable time were spent face-to-face with Mr. Lonigro by the psychometrist. This includes both test administration and scoring time. Billing for these services is reflected in the clinical report generated by Dr. Christia Reading, Ph.D.  This note reflects time spent with the psychometrician and does not include test scores or any clinical interpretations made by Dr. [name]. The full report will follow in a separate note.

## 2021-08-26 ENCOUNTER — Encounter: Payer: Self-pay | Admitting: Psychology

## 2021-08-26 DIAGNOSIS — F0280 Dementia in other diseases classified elsewhere without behavioral disturbance: Secondary | ICD-10-CM | POA: Insufficient documentation

## 2021-08-26 DIAGNOSIS — G3101 Pick's disease: Secondary | ICD-10-CM

## 2021-08-26 DIAGNOSIS — G3184 Mild cognitive impairment, so stated: Secondary | ICD-10-CM | POA: Insufficient documentation

## 2021-08-26 HISTORY — DX: Pick's disease: G31.01

## 2021-08-26 HISTORY — DX: Mild cognitive impairment of uncertain or unknown etiology: G31.84

## 2021-08-26 HISTORY — DX: Dementia in other diseases classified elsewhere, unspecified severity, without behavioral disturbance, psychotic disturbance, mood disturbance, and anxiety: F02.80

## 2021-09-01 ENCOUNTER — Encounter: Payer: Medicare HMO | Admitting: Psychology

## 2021-09-02 ENCOUNTER — Other Ambulatory Visit: Payer: Self-pay

## 2021-09-02 ENCOUNTER — Ambulatory Visit (INDEPENDENT_AMBULATORY_CARE_PROVIDER_SITE_OTHER): Payer: Medicare HMO | Admitting: Psychology

## 2021-09-02 DIAGNOSIS — G3101 Pick's disease: Secondary | ICD-10-CM | POA: Diagnosis not present

## 2021-09-02 DIAGNOSIS — F0280 Dementia in other diseases classified elsewhere without behavioral disturbance: Secondary | ICD-10-CM

## 2021-09-02 DIAGNOSIS — G3184 Mild cognitive impairment, so stated: Secondary | ICD-10-CM

## 2021-09-02 NOTE — Progress Notes (Signed)
   Neuropsychology Feedback Session [name]. [place] [facility] of Neu[facility]  Reason for Referral:   Robert Mann is a 70 y.o. right-handed Caucasian male referred by  Dorothyann Peng, NP , to characterize his current cognitive functioning and assist with diagnostic clarity and treatment planning in the context of subjective cognitive decline.   Feedback:   Mr. Portnoy completed a comprehensive neuropsychological evaluation on 08/25/2021. Please refer to that encounter for the full report and recommendations. Briefly, results suggested diffuse cognitive impairment relative to age-matched peers. He did exhibit appropriate performances across visuospatial abilities and confrontation naming. However, all other assessed domains exhibited severe cognitive impairment. 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 This neurodegenerative disease process is characterized by pronounced impairment with language and executive dysfunction when in earlier stages. While Mr. Kunz showed relatively diffuse impairment, these areas arguably exhibited greatest impairment. In particular, the non-fluent PPA subtype is characterized by the evolution of significant word finding difficulties and halting, effortful speech. These symptoms were very prevalent during the current evaluation, as well as reported by Mr. Statzer and his wife as being present and worsening over the past 1-2 years. Specific to Alzheimer's disease, Mr. Brock did show significant memory impairment across testing. However, it is worth highlighting that he exhibited appropriate retention scores across 3/4 memory tasks. This would suggest that greater memory difficulty is across encoding (i.e., learning) and retrieval aspects of memory, likely worsened by profound deficits in receptive and expressive language. Strong retention scores would not  suggest a primary memory storage deficit, which would be inconsistent with typically presenting Alzheimer's disease.   Mr. Frericks was accompanied by his wife during the current feedback session. Content of the current session focused on the results of his neuropsychological evaluation. Mr. Bugaj was given the opportunity to ask questions and his questions were answered. He was encouraged to reach out should additional questions arise. A copy of his report was provided at the conclusion of the visit.      30 minutes were spent conducting the current feedback session with Mr. Lant, billed as one unit [phone].

## 2021-09-07 ENCOUNTER — Other Ambulatory Visit: Payer: Self-pay

## 2021-09-08 ENCOUNTER — Encounter: Payer: Self-pay | Admitting: Adult Health

## 2021-09-08 ENCOUNTER — Ambulatory Visit (INDEPENDENT_AMBULATORY_CARE_PROVIDER_SITE_OTHER): Payer: Medicare HMO | Admitting: Adult Health

## 2021-09-08 VITALS — BP 132/82 | HR 83 | Temp 98.5°F | Ht 71.0 in | Wt 175.0 lb

## 2021-09-08 DIAGNOSIS — Z0000 Encounter for general adult medical examination without abnormal findings: Secondary | ICD-10-CM | POA: Diagnosis not present

## 2021-09-08 DIAGNOSIS — I10 Essential (primary) hypertension: Secondary | ICD-10-CM | POA: Diagnosis not present

## 2021-09-08 DIAGNOSIS — Z23 Encounter for immunization: Secondary | ICD-10-CM

## 2021-09-08 DIAGNOSIS — E782 Mixed hyperlipidemia: Secondary | ICD-10-CM | POA: Diagnosis not present

## 2021-09-08 DIAGNOSIS — R4701 Aphasia: Secondary | ICD-10-CM | POA: Diagnosis not present

## 2021-09-08 DIAGNOSIS — Z125 Encounter for screening for malignant neoplasm of prostate: Secondary | ICD-10-CM | POA: Diagnosis not present

## 2021-09-08 LAB — COMPREHENSIVE METABOLIC PANEL
ALT: 37 U/L (ref 0–53)
AST: 24 U/L (ref 0–37)
Albumin: 4.3 g/dL (ref 3.5–5.2)
Alkaline Phosphatase: 68 U/L (ref 39–117)
BUN: 22 mg/dL (ref 6–23)
CO2: 29 mEq/L (ref 19–32)
Calcium: 9.5 mg/dL (ref 8.4–10.5)
Chloride: 105 mEq/L (ref 96–112)
Creatinine, Ser: 1.09 mg/dL (ref 0.40–1.50)
GFR: 69.44 mL/min (ref >60.00)
Glucose, Bld: 84 mg/dL (ref 70–99)
Potassium: 4.1 mEq/L (ref 3.5–5.1)
Sodium: 141 mEq/L (ref 135–145)
Total Bilirubin: 0.6 mg/dL (ref 0.2–1.2)
Total Protein: 7.2 g/dL (ref 6.0–8.3)

## 2021-09-08 LAB — LIPID PANEL
Cholesterol: 227 mg/dL — ABNORMAL HIGH (ref 0–200)
HDL: 44.6 mg/dL (ref >39.00)
LDL Cholesterol: 152 mg/dL — ABNORMAL HIGH (ref 0–99)
NonHDL: 182.49
Total CHOL/HDL Ratio: 5
Triglycerides: 153 mg/dL — ABNORMAL HIGH (ref 0.0–149.0)
VLDL: 30.6 mg/dL (ref 0.0–40.0)

## 2021-09-08 LAB — CBC WITH DIFFERENTIAL/PLATELET
Basophils Absolute: 0.1 10*3/uL (ref 0.0–0.1)
Basophils Relative: 0.8 % (ref 0.0–3.0)
Eosinophils Absolute: 0.2 10*3/uL (ref 0.0–0.7)
Eosinophils Relative: 2.1 % (ref 0.0–5.0)
HCT: 49.3 % (ref 39.0–52.0)
Hemoglobin: 16.3 g/dL (ref 13.0–17.0)
Lymphocytes Relative: 26.1 % (ref 12.0–46.0)
Lymphs Abs: 2.1 10*3/uL (ref 0.7–4.0)
MCHC: 33.2 g/dL (ref 30.0–36.0)
MCV: 89.7 fl (ref 78.0–100.0)
Monocytes Absolute: 1 10*3/uL (ref 0.1–1.0)
Monocytes Relative: 12.2 % — ABNORMAL HIGH (ref 3.0–12.0)
Neutro Abs: 4.8 10*3/uL (ref 1.4–7.7)
Neutrophils Relative %: 58.8 % (ref 43.0–77.0)
Platelets: 235 10*3/uL (ref 150.0–400.0)
RBC: 5.49 Mil/uL (ref 4.22–5.81)
RDW: 13.9 % (ref 11.5–15.5)
WBC: 8.1 10*3/uL (ref 4.0–10.5)

## 2021-09-08 LAB — PSA: PSA: 1.96 ng/mL (ref 0.10–4.00)

## 2021-09-08 LAB — TSH: TSH: 1.74 u[IU]/mL (ref 0.35–5.50)

## 2021-09-08 NOTE — Progress Notes (Signed)
Subjective:    Patient ID: Robert Mann, male    DOB: 1950/12/27, 70 y.o.   MRN: [id number]  HPI Patient presents for yearly preventative medicine examination. 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  HTN -currently prescribed Norvasc 5 mg and losartan 25 mg daily.  He denies dizziness, lightheadedness, chest pain, or shortness of breath.  Does monitor his blood pressures at home periodically and reports readings in the 120s to 130s over 70s to 80s.  BP Readings from Last 3 Encounters:  09/08/21 132/82  06/03/21 (!) 140/92  04/29/21 (!) 150/120   Hyperlipidemia -has refused statins in the past. 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  Seems like he is processing this diagnosis fairly well.  His wife has been very supportive.  Has upcoming appointment with neurology on 09/16/2021 for further management.  All immunizations and health maintenance protocols were reviewed with the patient and needed orders were placed.  Will receive flu shot today.  Encouraged shingles vaccination as well as new COVID booster.  Appropriate screening laboratory values were ordered for the patient including screening of hyperlipidemia, renal function and hepatic function. If indicated by BPH, a PSA was ordered.  Medication reconciliation,  past medical history, social history, problem list and allergies were reviewed in detail with the patient  Goals were established with regard to weight loss, exercise, and   diet in compliance with medications  End of life planning was discussed.  He has an advanced directive and living will  Review of Systems  Constitutional: Negative.   HENT: Negative.    Eyes: Negative.   Respiratory: Negative.    Cardiovascular: Negative.   Gastrointestinal: Negative.   Endocrine: Negative.   Genitourinary: Negative.   Musculoskeletal: Negative.   Skin: Negative.   Allergic/Immunologic: Negative.   Neurological: Negative.   Hematological: Negative.   Psychiatric/Behavioral: Negative.    All other systems reviewed and are negative.  Past Medical History:  Diagnosis Date   Essential hypertension    History of kidney stones    History of melanoma 01/2018   left chest   History of placement of chest tube 10/1979   History of pneumothorax 1980   Hyperlipidemia    Mild neurocognitive disorder, 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    Social History   Socioeconomic History   Marital status: Married    Spouse name: Not on file   Number of children: 2   Years of education: 14   Highest education level: Some college, no degree  Occupational History   Occupation: part time   Tobacco Use   Smoking status: Former    Types: Cigarettes    Quit date: 08/08/1980    Years since quitting: 41.1   Smokeless tobacco: Never   Tobacco comments:    Quit smoking cigarettes in 1981  Vaping Use   Vaping Use: Never used  Substance and Sexual Activity   Alcohol use: Yes    Comment: social    Drug use: No   Sexual activity: Not on file  Other Topics Concern   Not on file  Social History Narrative   Working PT; job requires a tremendous amount of walking   Married    3 children   Social Determinants of Radio broadcast assistant Strain: Not on file  Food Insecurity: Not on file  Transportation Needs: Not on file  Physical Activity: Not on file  Stress: Not on file  Social Connections: Not on file  Intimate Partner Violence: Not on file     Past Surgical History:  Procedure Laterality Date   [place] Left 02/08/2018   Procedure: WIDE EXCISION OF LEFT CHEST MELANOMA AND LEFT AXILLARY SENTINEL LYMPH NODE;  Surgeon: Georganna Skeans, MD;  Location: [facility];  Service: General;  Laterality: Left;   WISDOM TOOTH EXTRACTION      Family History  Problem Relation Age of Onset   Hypertension Mother    Stroke Mother    Hyperlipidemia Mother    Heart disease Mother    Hypertension Father    Heart attack Father    Heart disease Father    Hyperlipidemia Father    Cancer Brother 15       Glioblastoma     Allergies  Allergen Reactions   Lisinopril Cough    Current Outpatient Medications on File Prior to Visit  Medication Sig Dispense Refill   amLODipine (NORVASC) 5 MG tablet Take 1 tablet (5 mg total) by mouth daily. 90 tablet 3   Blood Pressure Monitoring (BLOOD PRESSURE MONITOR/M CUFF) MISC One cuff for management of hypertension 1 each 0   Cholecalciferol (VITAMIN D3) 20 MCG TABS Take 25 mcg by mouth daily.     Coenzyme Q10 (CO Q 10 PO) Take 300 mg by mouth daily.      fexofenadine (ALLEGRA ODT) 30 MG disintegrating tablet Take 30 mg by mouth daily.     Glucosamine-Chondroit-Vit C-Mn (GLUCOSAMINE CHONDR 1500 COMPLX PO) Take 1 tablet by mouth daily.     losartan (COZAAR) 25 MG tablet Take 1 tablet (25 mg total) by mouth daily. 90 tablet 0   Multiple Vitamins-Minerals (CENTRUM SILVER PO) Take 1 tablet by mouth daily.     naproxen sodium (ALEVE) 220 MG tablet Take 220 mg by mouth as needed.     Omega-3 Fatty Acids (FISH OIL PO) Take 1 capsule by mouth daily.     Current Facility-Administered Medications on File Prior to Visit  Medication Dose Route Frequency Provider Last Rate Last Admin   0.9 %  sodium chloride infusion  500 mL Intravenous Once Nandigam, Kavitha V, MD        BP 132/82   Pulse 83   Temp 98.5 F (36.9 C) (Oral)   Ht 5\' 11"  (1.803 m)   Wt 175 lb (79.4 kg)   SpO2  96%   BMI 24.41 kg/m        Objective:   Physical Exam Vitals and nursing note reviewed.  Constitutional:      General: He is not in acute distress.    Appearance: Normal appearance. He is well-developed and normal weight.  HENT:     Head: Normocephalic and atraumatic.     Right Ear: Tympanic membrane, ear canal and external ear normal. There is no impacted cerumen.     Left Ear: Tympanic membrane, ear canal and external ear normal. There is no impacted cerumen.     Nose: Nose normal. No congestion or rhinorrhea.     Mouth/Throat:     Mouth:  Mucous membranes are moist.     Pharynx: Oropharynx is clear. No oropharyngeal exudate or posterior oropharyngeal erythema.  Eyes:     General:        Right eye: No discharge.        Left eye: No discharge.     Extraocular Movements: Extraocular movements intact.     Conjunctiva/sclera: Conjunctivae normal.     Pupils: Pupils are equal, round, and reactive to light.  Neck:     Vascular: No carotid bruit.     Trachea: No tracheal deviation.  Cardiovascular:     Rate and Rhythm: Normal rate and regular rhythm.     Pulses: Normal pulses.     Heart sounds: Normal heart sounds. No murmur heard.   No friction rub. No gallop.  Pulmonary:     Effort: Pulmonary effort is normal. No respiratory distress.     Breath sounds: Normal breath sounds. No stridor. No wheezing, rhonchi or rales.  Chest:     Chest wall: No tenderness.  Abdominal:     General: Bowel sounds are normal. There is no distension.     Palpations: Abdomen is soft. There is no mass.     Tenderness: There is no abdominal tenderness. There is no right CVA tenderness, left CVA tenderness, guarding or rebound.     Hernia: No hernia is present.  Musculoskeletal:        General: No swelling, tenderness, deformity or signs of injury. Normal range of motion.     Right lower leg: No edema.     Left lower leg: No edema.  Lymphadenopathy:     Cervical: No cervical adenopathy.   Skin:    General: Skin is warm and dry.     Capillary Refill: Capillary refill takes less than 2 seconds.     Coloration: Skin is not jaundiced or pale.     Findings: No bruising, erythema, lesion or rash.  Neurological:     General: No focal deficit present.     Mental Status: He is alert and oriented to person, place, and time.     Cranial Nerves: No cranial nerve deficit.     Sensory: No sensory deficit.     Motor: No weakness.     Coordination: Coordination normal.     Gait: Gait normal.     Deep Tendon Reflexes: Reflexes normal.     Comments: No deficits noted today   Psychiatric:        Mood and Affect: Mood normal.        Behavior: Behavior normal.        Thought Content: Thought content normal.        Judgment: Judgment normal.      Assessment & Plan:  1. Routine general medical examination at a health care facility  - CBC with Differential/Platelet; Future - Comprehensive metabolic panel; Future - Lipid panel; Future - TSH; Future - TSH - Lipid panel - Comprehensive metabolic panel - CBC with Differential/Platelet  2. Need for immunization against influenza  - Flu Vaccine QUAD High Dose(Fluad)  3. Essential hypertension - Controlled. No change in medications  - CBC with Differential/Platelet; Future - Comprehensive metabolic panel; Future - Lipid panel; Future - TSH; Future - TSH - Lipid panel - Comprehensive metabolic panel - CBC with Differential/Platelet  4. Aphasia - Follow up with Neurology as directed  5. Mixed hyperlipidemia - Likely place on statin  - CBC with Differential/Platelet; Future - Comprehensive metabolic panel; Future - Lipid panel; Future -  TSH; Future - TSH - Lipid panel - Comprehensive metabolic panel - CBC with Differential/Platelet  6. Prostate cancer screening  - PSA; Future - PSA  Dorothyann Peng, NP

## 2021-09-08 NOTE — Patient Instructions (Signed)
It was great seeing you today   I will follow up with you tomorrow about your lab appointment   I will keep an eye out on your chart for the notes after you see neurology.   If you need anything at all, please let me know

## 2021-09-15 ENCOUNTER — Encounter: Payer: Self-pay | Admitting: Physician Assistant

## 2021-09-15 ENCOUNTER — Other Ambulatory Visit: Payer: Self-pay

## 2021-09-15 ENCOUNTER — Ambulatory Visit: Payer: Medicare HMO | Admitting: Physician Assistant

## 2021-09-15 VITALS — BP 156/104 | HR 107 | Resp 18 | Ht 71.0 in | Wt 177.0 lb

## 2021-09-15 DIAGNOSIS — G3184 Mild cognitive impairment, so stated: Secondary | ICD-10-CM

## 2021-09-15 DIAGNOSIS — G3101 Pick's disease: Secondary | ICD-10-CM

## 2021-09-15 DIAGNOSIS — F0280 Dementia in other diseases classified elsewhere without behavioral disturbance: Secondary | ICD-10-CM

## 2021-09-15 DIAGNOSIS — R69 Illness, unspecified: Secondary | ICD-10-CM | POA: Diagnosis not present

## 2021-09-15 MED ORDER — DONEPEZIL HCL 10 MG PO TABS: ORAL_TABLET | ORAL | 11 refills | Status: DC

## 2021-09-15 NOTE — Progress Notes (Signed)
Assessment/Plan:   Robert Mann is a 70 y.o. 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  Discussed the importance of regular daily schedule with inclusion of crossword puzzles to maintain brain function.  Continue to monitor mood with PCP.  Stay active at least 30 minutes at least 3 times a week.  Naps should be scheduled and should be no longer than 60 minutes and should not occur after 2 PM.  Mediterranean diet is recommended  Start donepezil half tablet (5mg ) daily for 2  weeks, then  increase the dose to a full tablet of 10 mg daily.  Side effects discussed Referral to speech therapy   Subjective:    The patient is seen in neurologic consultation at the request of Robert Coca, PhD for the evaluation of memory loss in the setting of Primary Progressive Aphasia.  The patient is here alone.  This is a 70 year old right-handed male who had memory issues for about 2 years.  He describes it as "I have the words in my head, but can get them out ".  This has been worse over the last 6 months.  He lives with his wife who has also noticed the same changes.  His mood is good, but he is also very cautious about talking to people, which has made him more withdrawn, and reviewed that he is afraid that people will notice.  He denies depression or irritability.  He likes to read, but he has to sometimes read the same article several times until "getting it ".  During the visit, he has shown some  frustration in trying to find the right word.  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  He noted yes the porch.   He sleeps well, denies vivid dreams or sleepwalking, hallucinations or paranoia.  He denies leaving objects in unusual places, but cannot remember at times where he left him.  He is independent of bathing and dressing, no issues with hygiene.  He does not forget to take his medications, he is in charge of his finances, denies missing any bills.  His appetite is good, denies any trouble swallowing, or sialorrhea.  He has a history of reflux, that at times causes him to have some throat discomfort.  His wife does the cooking.  He ambulates about 10 miles a day without difficulties, denies any falls.  Over the years he had several minor injuries in the head, stating that "I build stuff and hit my head several times ".  He continues to drive without getting lost.  He denies any headaches, double vision, dizziness, focal numbness or tingling, unilateral weakness, tremors or anosmia.  No history of seizures.  Denies urine incontinence, retention constipation or diarrhea.  He denies a history of sleep apnea, but has he does say that at times he notices snoring.  He denies a history of alcohol or tobacco.  Family history negative for dementia.  Labs 09/08/21 CBC, CMP  normal TC 227, TG 153, LDL 152  TSH 1.79 B12 >2000 04/2021  MRI brain wo contrast 08/18/21 No evidence of acute intracranial abnormality. Prominent perivascular space versus chronic lacunar infarct within the central pons. Moderate generalized cerebral atrophy with comparatively mild cerebellar atrophy. Otherwise unremarkable MRI appearance of the brain for age. Small left maxillary sinus mucous retention cyst.     Neuropsychological evaluation on 09/02/2021 Robert Mann "Briefly, results suggested diffuse cognitive impairment  relative to age-matched peers. He did exhibit appropriate performances across visuospatial abilities and confrontation naming. However, all other assessed domains exhibited severe cognitive impairment. 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 This neurodegenerative disease process is characterized by pronounced impairment with language and executive dysfunction when in earlier stages. While Robert Mann showed relatively diffuse impairment, these areas arguably exhibited greatest impairment. In particular, the non-fluent PPA subtype is characterized by the evolution of significant word finding difficulties and halting, effortful speech. These symptoms were very prevalent during the current evaluation, as well as reported by Robert Mann and his wife as being present and worsening over the past 1-2 years. Specific to Alzheimer's disease, Robert Mann did show significant memory impairment across testing. However, it is worth highlighting that he exhibited appropriate retention scores across 3/4 memory tasks. This would suggest that greater memory difficulty is across encoding (i.e., learning) and retrieval aspects of memory, likely worsened by profound deficits in receptive and expressive language. Strong retention scores would not suggest a primary memory storage deficit, which would be inconsistent with typically presenting Alzheimer's disease"  When meeting with his neurologist, I would recommend that they discuss neurological medications which may slow decline. It is important to highlight that these medications may slow functional decline in some individuals. Unfortunately, there is no current treatment which is able to stop or reverse these changes. To aid with further diagnostic clarity, they could also discuss the pros and cons of an FDG-PET scan as this can yield helpful information in cases such as this.     Allergies   Allergen Reactions   Lisinopril Cough    Current Outpatient Medications  Medication Instructions   amLODipine (NORVASC) 5 mg, Oral, Daily   Blood Pressure Monitoring (BLOOD PRESSURE MONITOR/M CUFF) MISC One cuff for management of hypertension   Coenzyme Q10 (CO Q 10 PO) 300 mg, Oral, Daily   fexofenadine (ALLEGRA ODT) 30 mg, Oral, Daily   Glucosamine-Chondroit-Vit C-Mn (GLUCOSAMINE CHONDR 1500 COMPLX PO) 1 tablet, Oral, Daily   losartan (COZAAR) 25 mg, Oral, Daily   Multiple Vitamins-Minerals (CENTRUM SILVER PO) 1 tablet, Oral, Daily   naproxen sodium (ALEVE) 220 mg, Oral, As needed   Omega-3 Fatty Acids (FISH OIL PO) 1 capsule, Oral, Daily   Vitamin D3 25 mcg, Oral, Daily     VITALS:   Vitals:   09/15/21 1302  BP: (!) 156/104  Pulse: (!) 107  Resp: 18  SpO2: 98%  Weight: 177 lb (80.3 kg)  Height: 5\' 11"  (1.803 m)   Depression screen [facility] 2/9 04/29/2021 12/31/2019  Decreased Interest 0 0  Down, Depressed, Hopeless 0 0  PHQ - 2 Score 0 0    PHYSICAL EXAM   HEENT:  Normocephalic, atraumatic. The mucous membranes are moist. The superficial temporal arteries are without ropiness or tenderness. Cardiovascular: Regular rate and rhythm. Lungs: Clear to auscultation bilaterally. Neck: There are no carotid bruits noted bilaterally.  NEUROLOGICAL: No flowsheet data found. No flowsheet data found.  No flowsheet  data found.   Orientation:  Alert and oriented to person, place and time. Speech non fluent but able to comprehend well.  Fund of knowledge is appropriate. Recent memory impaired and remote memory intact.  Attention and concentration are normal. Unable to name objects immediately, may need time, unable to repeat phrases.  Cranial nerves: There is good facial symmetry. Extraocular muscles are intact and visual fields are full to confrontational testing. Speech is fluent and clear. Soft palate rises symmetrically and there is no tongue deviation. Hearing is intact to  conversational tone. Tone: Tone is good throughout. Sensation: Sensation is intact to light touch and pinprick throughout. Vibration is intact at the bilateral big toe.There is no extinction with double simultaneous stimulation. There is no sensory dermatomal level identified. Coordination: The patient has no difficulty with RAM's or FNF bilaterally. Normal finger to nose  Motor: Strength is 5/5 in the bilateral upper and lower extremities. There is no pronator drift. There are no fasciculations noted. DTR's: Deep tendon reflexes are 2/4 at the bilateral biceps, triceps, brachioradialis, patella and achilles.  Plantar responses are downgoing bilaterally. Gait and Station: The patient is able to ambulate without difficulty.The patient is able to heel toe walk without any difficulty.The patient is able to ambulate in a tandem fashion. The patient is able to stand in the Romberg position.     Thank you for allowing [place] the opportunity to participate in the care of this nice patient. Please do not hesitate to contact us for any questions or concerns.   Total time spent on today's visit was 60 minutes, including both face-to-face time and nonface-to-face time.  Time included that spent on review of records (prior notes available to me/labs/imaging if pertinent), discussing treatment and goals, answering patient's questions and coordinating care.  Cc:  Dorothyann Peng, NP  Sharene Butters 09/15/2021 1:37 PM

## 2021-09-15 NOTE — Patient Instructions (Signed)
It was a pleasure to see you today at our office.   Recommendations:  Meds: Follow up in 6  months We will start donepezil half tablet (5mg ) daily for 2  weeks.  If you are tolerating the medication, then after 2 weeks, we will increase the dose to a full tablet of 10 mg daily.  Side effects include nausea, vomiting, diarrhea, vivid dreams, and muscle cramps.  Please call the clinic if you experience any of these symptoms.  Referral to speech therapy    RECOMMENDATIONS FOR ALL PATIENTS WITH MEMORY PROBLEMS: 1. Continue to exercise (Recommend 30 minutes of walking everyday, or 3 hours every week) 2. Increase social interactions - continue going to [place] and enjoy social gatherings with friends and family 3. Eat healthy, avoid fried foods and eat more fruits and vegetables 4. Maintain adequate blood pressure, blood sugar, and blood cholesterol level. Reducing the risk of stroke and cardiovascular disease also helps promoting better memory. 5. Avoid stressful situations. Live a simple life and avoid aggravations. Organize your time and prepare for the next day in anticipation. 6. Sleep well, avoid any interruptions of sleep and avoid any distractions in the bedroom that may interfere with adequate sleep quality 7. Avoid sugar, avoid sweets as there is a strong link between excessive sugar intake, diabetes, and cognitive impairment We discussed the Mediterranean diet, which has been shown to help patients reduce the risk of progressive memory disorders and reduces cardiovascular risk. This includes eating fish, eat fruits and green leafy vegetables, nuts like almonds and hazelnuts, walnuts, and also use olive oil. Avoid fast foods and fried foods as much as possible. Avoid sweets and sugar as sugar use has been linked to worsening of memory function.  There is always a concern of gradual progression of memory problems. If this is the case, then we may need to adjust level of care according to  patient needs. Support, both to the patient and caregiver, should then be put into place.    The Alzheimer's Association is here all day, every day for people facing Alzheimer's disease through our free 24/7 Helpline: [phone]. The Helpline provides reliable information and support to all those who need assistance, such as individuals living with memory loss, Alzheimer's or other dementia, caregivers, health care professionals and the public.  Our highly trained and knowledgeable staff can help you with: Understanding memory loss, dementia and Alzheimer's  Medications and other treatment options  General information about aging and brain health  Skills to provide quality care and to find the best care from professionals  Legal, financial and living-arrangement decisions Our Helpline also features: Confidential care consultation provided by master's level clinicians who can help with decision-making support, crisis assistance and education on issues families face every day  Help in a caller's preferred language using our translation service that features more than 200 languages and dialects  Referrals to local community programs, services and ongoing support     FALL PRECAUTIONS: Be cautious when walking. Scan the area for obstacles that may increase the risk of trips and falls. When getting up in the mornings, sit up at the edge of the bed for a few minutes before getting out of bed. Consider elevating the bed at the head end to avoid drop of blood pressure when getting up. Walk always in a well-lit room (use night lights in the walls). Avoid area rugs or power cords from appliances in the middle of the walkways. Use a walker or a cane if  necessary and consider physical therapy for balance exercise. Get your eyesight checked regularly.  FINANCIAL OVERSIGHT: Supervision, especially oversight when making financial decisions or transactions is also recommended.  HOME SAFETY: Consider the  safety of the kitchen when operating appliances like stoves, microwave oven, and blender. Consider having supervision and share cooking responsibilities until no longer able to participate in those. Accidents with firearms and other hazards in the house should be identified and addressed as well.   ABILITY TO BE LEFT ALONE: If patient is unable to contact 911 operator, consider using LifeLine, or when the need is there, arrange for someone to stay with patients. Smoking is a fire hazard, consider supervision or cessation. Risk of wandering should be assessed by caregiver and if detected at any point, supervision and safe proof recommendations should be instituted.  MEDICATION SUPERVISION: Inability to self-administer medication needs to be constantly addressed. Implement a mechanism to ensure safe administration of the medications.   DRIVING: Regarding driving, in patients with progressive memory problems, driving will be impaired. We advise to have someone else do the driving if trouble finding directions or if minor accidents are reported. Independent driving assessment is available to determine safety of driving.   If you are interested in the driving assessment, you can contact the following:  [employer] in [place]  [place] [phone] or [phone]      [place] refers to food and lifestyle choices that are based on the traditions of countries located on the [employer]. This way of eating has been shown to help prevent certain conditions and improve outcomes for people who have chronic diseases, like kidney disease and heart disease. What are tips for following this plan? Lifestyle  Cook and eat meals together with your family, when possible. Drink enough fluid to keep your urine clear or pale yellow. Be physically active  every day. This includes: Aerobic exercise like running or swimming. Leisure activities like gardening, walking, or housework. Get 7-8 hours of sleep each night. If recommended by your health care provider, drink red wine in moderation. This means 1 glass a day for nonpregnant women and 2 glasses a day for men. A glass of wine equals 5 oz (150 mL). Reading food labels  Check the serving size of packaged foods. For foods such as rice and pasta, the serving size refers to the amount of cooked product, not dry. Check the total fat in packaged foods. Avoid foods that have saturated fat or trans fats. Check the ingredients list for added sugars, such as corn syrup. Shopping  At the grocery store, buy most of your food from the areas near the walls of the store. This includes: Fresh fruits and vegetables (produce). Grains, beans, nuts, and seeds. Some of these may be available in unpackaged forms or large amounts (in bulk). Fresh seafood. Poultry and eggs. Low-fat dairy products. Buy whole ingredients instead of prepackaged foods. Buy fresh fruits and vegetables in-season from local farmers markets. Buy frozen fruits and vegetables in resealable bags. If you do not have access to quality fresh seafood, buy precooked frozen shrimp or canned fish, such as tuna, salmon, or sardines. Buy small amounts of raw or cooked vegetables, salads, or olives from the deli or salad bar at your store. Stock your pantry so you always have certain foods on hand, such as olive oil, canned tuna, canned tomatoes, rice, pasta, and beans. Cooking  Cook foods with extra-virgin olive oil instead of using butter or other vegetable oils. Have meat as a side dish, and have vegetables or grains as your main dish. This means having meat in small portions or adding small amounts of meat to foods like pasta or stew. Use beans or vegetables instead of meat in common dishes like chili or lasagna. Experiment with different cooking  methods. Try roasting or broiling vegetables instead of steaming or sauteing them. Add frozen vegetables to soups, stews, pasta, or rice. Add nuts or seeds for added healthy fat at each meal. You can add these to yogurt, salads, or vegetable dishes. Marinate fish or vegetables using olive oil, lemon juice, garlic, and fresh herbs. Meal planning  Plan to eat 1 vegetarian meal one day each week. Try to work up to 2 vegetarian meals, if possible. Eat seafood 2 or more times a week. Have healthy snacks readily available, such as: Vegetable sticks with hummus. Greek yogurt. Fruit and nut trail mix. Eat balanced meals throughout the week. This includes: Fruit: 2-3 servings a day Vegetables: 4-5 servings a day Low-fat dairy: 2 servings a day Fish, poultry, or lean meat: 1 serving a day Beans and legumes: 2 or more servings a week Nuts and seeds: 1-2 servings a day Whole grains: 6-8 servings a day Extra-virgin olive oil: 3-4 servings a day Limit red meat and sweets to only a few servings a month What are my food choices? Mediterranean diet Recommended Grains: Whole-grain pasta. Brown rice. Bulgar wheat. Polenta. Couscous. Whole-wheat bread. [place]. Vegetables: Artichokes. Beets. Broccoli. Cabbage. Carrots. Eggplant. Green beans. Chard. Kale. Spinach. Onions. Leeks. Peas. Squash. Tomatoes. Peppers. Radishes. Fruits: Apples. Apricots. Avocado. Berries. Bananas. Cherries. Dates. Figs. Grapes. Lemons. Melon. Oranges. Peaches. Plums. Pomegranate. Meats and other protein foods: Beans. Almonds. Sunflower seeds. Pine nuts. Peanuts. [place]. Salmon. Scallops. Shrimp. [place]. Tilapia. Clams. Oysters. Eggs. Dairy: Low-fat milk. Cheese. Greek yogurt. Beverages: Water. Red wine. Herbal tea. Fats and oils: Extra virgin olive oil. Avocado oil. Grape seed oil. Sweets and desserts: [place] yogurt with honey. Baked apples. Poached pears. Trail mix. Seasoning and other foods: Basil. Cilantro. Coriander.  Cumin. Mint. Parsley. Sage. Rosemary. Tarragon. Garlic. Oregano. Thyme. Pepper. Balsalmic vinegar. Tahini. Hummus. Tomato sauce. Olives. Mushrooms. Limit these Grains: Prepackaged pasta or rice dishes. Prepackaged cereal with added sugar. Vegetables: Deep fried potatoes (french fries). Fruits: Fruit canned in syrup. Meats and other protein foods: Beef. Pork. Lamb. Poultry with skin. Hot dogs. [place] Salines. Dairy: Ice cream. Sour cream. Whole milk. Beverages: Juice. Sugar-sweetened soft drinks. Beer. Liquor and spirits. Fats and oils: Butter. Canola oil. Vegetable oil. Beef fat (tallow). Lard. Sweets and desserts: Cookies. Cakes. Pies. Candy. Seasoning and other foods: Mayonnaise. Premade sauces and marinades. The items listed may not be a complete list. Talk with your dietitian about what dietary choices are right for you. Summary The Mediterranean diet includes both food and lifestyle choices. Eat a variety of fresh fruits and vegetables, beans, nuts, seeds, and whole grains. Limit the amount of red meat and sweets that you eat. Talk with your health care provider about whether it is safe for you to drink red wine in moderation. This means 1 glass a day for nonpregnant women and 2 glasses a day for men. A glass of wine equals 5 oz (150 mL). This information is not intended to replace advice given to you by your health care provider. Make sure you discuss any questions you have with your health care provider. Document Released: 07/06/2016 Document  Revised: 08/08/2016 Document Reviewed: 07/06/2016 Elsevier Interactive Patient Education  2017 [employer].

## 2021-09-23 ENCOUNTER — Other Ambulatory Visit: Payer: Self-pay

## 2021-09-23 ENCOUNTER — Ambulatory Visit: Payer: Medicare HMO | Attending: Adult Health

## 2021-09-23 DIAGNOSIS — R482 Apraxia: Secondary | ICD-10-CM | POA: Insufficient documentation

## 2021-09-23 DIAGNOSIS — R41841 Cognitive communication deficit: Secondary | ICD-10-CM | POA: Insufficient documentation

## 2021-09-23 DIAGNOSIS — R4701 Aphasia: Secondary | ICD-10-CM | POA: Diagnosis not present

## 2021-09-23 NOTE — Patient Instructions (Signed)
   [email or web address]  [email or web address]

## 2021-09-23 NOTE — Therapy (Signed)
[facility] [address] [place], [place], [address] Phone: [phone]   Fax:  [phone]  Speech Language Pathology Evaluation  Patient Details  Name: Robert Mann MRN: [id number] Date of Birth: [birth year] Referring Provider (SLP): Robert Pesa. Shawn Route, PA-C   Encounter Date: 09/23/2021   End of Session - 09/23/21 1500     Visit Number 1    Number of Visits 25    Date for SLP Re-Evaluation 12/22/21    SLP Start Time 1450    SLP Stop Time  2542    SLP Time Calculation (min) 43 min    Activity Tolerance Patient tolerated treatment well   indicated frustration at times            Past Medical History:  Diagnosis Date   Essential hypertension    History of kidney stones    History of melanoma 01/2018   left chest   History of placement of chest tube 10/1979   History of pneumothorax 1980   Hyperlipidemia    Mild neurocognitive disorder, 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   Subjective Assessment - 09/23/21 1454     Subjective "I can't finish um... (laughs) .. can't finish what I was (laughs)  - what I want to say."    Patient is accompained by: Family member   wife Robert Mann   Currently in Pain? No/denies                SLP Evaluation [facility] - 09/23/21 1453       SLP Visit Information   SLP Received On 09/23/21    Referring Provider (SLP) Robert Pesa. Wertman, PA-C    Onset Date 2020    Medical Diagnosis PPA      Subjective   Patient/Family Stated Goal "Try to get to where I CAN talk."      General  Information   HPI Pt with pertinent PMH of hypertension, hyperlipidemia.  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 Results of neuropscyh testing Robert Mann) from earlier in 2022 were: "...diffuse cognitive impairment relative to age-matched peers. He did exhibit appropriate performances across visuospatial abilities and confrontation naming. However, all other assessed domains exhibited severe cognitive impairment. The most likely etiology for ongoing impairment appears to be an underlying primary progressive aphasia This neurodegenerative disease process  is characterized by pronounced impairment with language and executive dysfunction when in earlier stages. While Robert Mann showed relatively diffuse impairment, these areas arguably exhibited greatest impairment. In particular, the non-fluent PPA subtype is characterized by the evolution of significant word finding difficulties and halting, effortful speech. These symptoms were very prevalent during the current evaluation, as well as reported by Robert Mann and his wife as being present and worsening over the past 1-2 years."     Balance Screen   Has the patient fallen in the past 6 months No      Prior Functional Status   Cognitive/Linguistic Baseline Within functional limits    Type of Home House  Lives With Spouse    Available Support Friend(s);Family    Vocation Full time employment      Cognition   Overall Cognitive Status Impaired/Different from baseline    Area of Impairment Memory   in terms of reduced auditory memory for longer verbal material; executive function (per neuropsych testing)   Memory Comments Robert Mann demo'd reduced auditory memory with sentence recall on teh Quick Aphasia Battery (QAB).    Behaviors Poor frustration tolerance      Auditory Comprehension   Overall Auditory Comprehension Impaired    Conversation --    conversational comprehension was relatively intact. Out-of-context stimuli -e.g,more complex yes/no questions- resulted in Robert Mann requesting repeats and answering yes/no incorrectly even with those repeats of the question.       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 SLP believes he MAY HAVE attempted circumlocution adn synonym strategies, but unsure due to being largely unsuccessful. His use of restart/revision was also unsuccessful more than it was successful. He will benefit from learning a wider variety of compensatory strategies and practice these with the guidance of SLP.      Motor Speech   Overall Motor Speech Impaired    Motor Planning Impaired    Level of Impairment Word    Motor Speech Errors Aware;Inconsistent   pt's motor planning skills were better in imitation. SLP asked pt to produce "/p^t^k^/" and imitated 3/3 however when asked to do so spontaneously ("now say that last one again,") exhibited transposition of consonants to other syllables 3/3   Effective Techniques --   suspect slowing speech rate will benefit pt     Standardized Assessments   Standardized Assessments  Other Assessment   QAB                                SLP Education - 09/23/21 2215     Education Details PPA on [email or web address], ST to focus on slowing progression of decline and planning for  future cont'd decline of verbal communication    Person(s) Educated Patient;Spouse    Methods Explanation    Comprehension Verbalized understanding              SLP Short Term Goals - 09/23/21 2257       SLP SHORT TERM GOAL #1   Title pt will tell SLP three compensatory strategies for anomia/apraxia    Time 4    Period Weeks    Status New    Target Date 10/28/21   date extended due to holiday week     SLP SHORT TERM GOAL #2   Title pt family will demo appropriate cueing technique with rare min A from SLP    Time 4    Period Weeks    Status New    Target Date 10/28/21      SLP SHORT TERM GOAL #3   Title  pt will initiate developement of device to assist his verbal communication    Time 4    Period Weeks    Status New    Target Date 10/28/21      SLP SHORT TERM GOAL #4   Title pt will complete linguistic homework 80% of the time requested    Time 4    Period Weeks    Status New    Target Date 10/28/21              SLP Long Term Goals - 09/23/21 2300       SLP LONG TERM GOAL #1   Title pt will reportedly use/use two different compensatory strategies for anomia/apraxia successfully in 10 minutes simple conversation, over 3 sessions    Time 8    Period Weeks    Status New    Target Date 11/18/21      SLP LONG TERM GOAL #2   Title pt will improve his score in outcome measure completed in first 2 therapy session (decr level of overall frustration about verbal communication)    Time 8    Period Weeks    Status New    Target Date 11/18/21      SLP LONG TERM GOAL #3   Title pt family will demo appropriate cueing techniques to assist pt verbal expression in 3 sessions    Time 8    Period Weeks    Status New    Target Date 11/25/21      SLP LONG TERM GOAL #4   Title pt will incr MLU in 2 minutes simple conversation to 6.5    Time 12    Period Weeks    Status New    Target Date 12/22/21      SLP LONG TERM GOAL #5   Title pt will indicate he talks more  at work during end of treatment course than prior to [place]    Time 12    Period Weeks    Status New    Target Date 12/22/21              Plan - 09/23/21 2217     Clinical Impression Statement Pt presents with, as rated by QAB, 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 Melvyn Mann. SLP believes pt has mild-mod expressive and receptive aphasia (expressive worse than receptive) complicated by mod apraxia of speech. Pt states he rarely talks at work (works for a [employer] [place] and expressed frustration during conversation portion of the assessment today, which he states is a regular feeling for him about his speaking. See eval body for more details but in general had notable anomia and some pausing and hesitation due to this but also due to speech-motor planning deficit. Repetition was more successful than spontaneous production in word/sentence repetition tasks. Robert Mann only used restart/revision strategy for compensation during conversation. He may have also used synonym and circumlocution but all of these were mostly unsuccessful. Pt would benefit from skilled ST to preserve remaining linguistic ability, learning how to use a wider variety of compensatory measures when talking, as well as family education on how to best assist pt in conversation, and lastly to begin to plan for the future as communication ability cont to decline.    Speech Therapy Frequency 2x / week    Duration 12 weeks    Treatment/Interventions Functional tasks;Compensatory techniques;Multimodal communcation approach;Cueing hierarchy;Language facilitation;SLP instruction and feedback;Internal/external aids;Patient/family education  Potential to Achieve Goals Good    Potential Considerations Medical prognosis    Consulted and Agree with Plan of Care Patient             Patient will benefit from skilled therapeutic intervention in order  to improve the following deficits and impairments:   Aphasia  Verbal apraxia  Cognitive communication deficit    Problem List Patient Active Problem List   Diagnosis Date Noted   Mild neurocognitive disorder, 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 [place], [place], [address] Phone: [phone]   Fax:  [phone]  Name: Robert Mann MRN: [id number] Date of Birth: May 25, 1951

## 2021-09-30 ENCOUNTER — Ambulatory Visit: Payer: Medicare HMO | Attending: Adult Health | Admitting: Speech Pathology

## 2021-09-30 ENCOUNTER — Other Ambulatory Visit: Payer: Self-pay

## 2021-09-30 DIAGNOSIS — R4701 Aphasia: Secondary | ICD-10-CM | POA: Insufficient documentation

## 2021-09-30 DIAGNOSIS — R482 Apraxia: Secondary | ICD-10-CM | POA: Diagnosis not present

## 2021-09-30 DIAGNOSIS — R41841 Cognitive communication deficit: Secondary | ICD-10-CM | POA: Diagnosis not present

## 2021-09-30 NOTE — Therapy (Signed)
[facility] [address] [place], [place], [address] Phone: [phone]   Fax:  [phone]  Speech Language Pathology Treatment  Patient Details  Name: Robert Mann MRN: [id number] Date of Birth: [birth year] Referring Provider (SLP): Coralee Pesa. 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   Subjective Assessment - 09/30/21 1416     Subjective "I just aphasia"    Patient is accompained by: Family member   [place]   Currently in Pain? No/denies                   ADULT SLP TREATMENT - 09/30/21 1419       General Information   Behavior/Cognition Alert;Cooperative;Pleasant mood      Treatment Provided   Treatment provided Cognitive-Linquistic      Cognitive-Linquistic Treatment   Treatment focused on Aphasia;Apraxia;Patient/family/caregiver education    Skilled Treatment Initiated training using semantic feature analysis to train for verbal  compensations for apahsia. With written and verbal  question cues,  pt successfully generated 3 salient descriptions for personally relevant words (golf ball, ice skate, football helmet, [employer] (a favorite restaurant). Trained wife, [name] on using simple question cues or cues to describe, such as "tell me about it"  They will practice this at home. Initiated training in compensatory strategies including looking at menu online before going out, writing down topics or key words that may come up in a social setting with friends, reducing back ground noise to A processing of conversation. Provided Aphasia ID cards and number to [facility]. Provided forms for [name] and [name] to generate personally relevant words for AAC.      Assessment / Recommendations / Plan   Plan Continue with current plan of care      Progression Toward Goals   Progression toward goals Progressing toward goals              SLP Education - 09/30/21 1506     Education Details verbal compensations for aphasia, AAC "cheat sheet", external compensations for aphasia    Person(s) Educated Patient;Spouse    Methods Explanation;Demonstration;Verbal cues;Handout    Comprehension Verbalized understanding;Returned demonstration;Verbal cues required;Need further instruction              SLP Short Term Goals - 09/30/21 1511  SLP SHORT TERM GOAL #1   Title pt will tell SLP three compensatory strategies for anomia/apraxia    Time 4    Period Weeks    Status On-going    Target Date 10/28/21   date extended due to holiday week     SLP SHORT TERM GOAL #2   Title pt family will demo appropriate cueing technique with rare min A from SLP    Time 4    Period Weeks    Status On-going    Target Date 10/28/21      SLP SHORT TERM GOAL #3   Title pt will initiate developement of device to assist his verbal communication    Time 4    Period Weeks    Status On-going    Target Date 10/28/21      SLP SHORT  TERM GOAL #4   Title pt will complete linguistic homework 80% of the time requested    Time 4    Period Weeks    Status On-going    Target Date 10/28/21              SLP Long Term Goals - 09/30/21 1512       SLP LONG TERM GOAL #1   Title pt will reportedly use/use two different compensatory strategies for anomia/apraxia successfully in 10 minutes simple conversation, over 3 sessions    Time 8    Period Weeks    Status On-going      SLP LONG TERM GOAL #2   Title pt will improve his score in outcome measure completed in first 2 therapy session (decr level of overall frustration about verbal communication)    Time 8    Period Weeks    Status On-going      SLP LONG TERM GOAL #3   Title pt family will demo appropriate cueing techniques to assist pt verbal expression in 3 sessions    Time 8    Period Weeks    Status On-going      SLP LONG TERM GOAL #4   Title pt will incr MLU in 2 minutes simple conversation to 6.5    Time 12    Period Weeks    Status New      SLP LONG TERM GOAL #5   Title pt will indicate he talks more at work during end of treatment course than prior to ST    Time 12    Period Weeks    Status On-going              Plan - 09/30/21 1507     Clinical Impression Statement 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 Initiated training in verbal compensations for aphsia with semantic feature analysis and spouse trainging for cueing [name]. Provided form for pt and spouse to generate personally relevant words for eventual AAC device. Continue skilled ST to preserve remaining linguistic ability, use compensations for communication, caregiver training and plan for progression of aphasia.    Speech Therapy Frequency 2x / week    Duration 12 weeks    Treatment/Interventions Functional tasks;Compensatory techniques;Multimodal communcation approach;Cueing hierarchy;Language facilitation;SLP instruction and feedback;Internal/external  aids;Patient/family education    Potential to Achieve Goals Good    Potential Considerations Medical prognosis             Patient will benefit from skilled therapeutic intervention in order to improve the following deficits and impairments:   Aphasia  Verbal apraxia  Cognitive communication deficit  Problem List Patient Active Problem List   Diagnosis Date Noted   Mild neurocognitive disorder, 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 [place], [place], [address] Phone: [phone]   Fax:  [phone]   Name: Robert Mann MRN: [id number] Date of Birth: 02/08/1951

## 2021-09-30 NOTE — Patient Instructions (Addendum)
   Encourage Robert Mann to describe what he can't get out by saying tell me about it or asking what does it look like, where do we keep it, what category is it   Call [employer] 9-1-1 [phone] and let them know you have aphasia and may not be able to communicate easily in an emergency  Read aloud 10 minutes twice as day as best you can  Look up menus before you go out  Practice names and topics that may come up  If you need to make a phone call you can write down  what you want to say before you make the call - even just a few topic words can take the pressure  Apps:  Talk Path - free  Tactus  Constant Therapy    Get the persons attention before you speak  Use eye contact and face the person you are speaking to  Be in close proximity to the person you are speaking to  Turn down any noise in the environment such as the TV, walk away from loud appliances, air conditioners, fans, dish washers etc  In large gatherings, sit or stay on the side not the center of the room  Try to sit with a wall behind you or in a corner so noise isn't coming at you from all directions when dining out or attending gatherings  If you have airpods or blue tooth headphones, the Live Listen feature on you iPhone can sync with your pods to act as a microphone to enhance conversation near you or at your table (control center, hearing, AirPods, Live Listen)

## 2021-10-05 ENCOUNTER — Other Ambulatory Visit: Payer: Self-pay

## 2021-10-05 ENCOUNTER — Ambulatory Visit: Payer: Medicare HMO

## 2021-10-05 DIAGNOSIS — R482 Apraxia: Secondary | ICD-10-CM | POA: Diagnosis not present

## 2021-10-05 DIAGNOSIS — R4701 Aphasia: Secondary | ICD-10-CM

## 2021-10-05 DIAGNOSIS — R41841 Cognitive communication deficit: Secondary | ICD-10-CM | POA: Diagnosis not present

## 2021-10-05 NOTE — Patient Instructions (Signed)
Write 10-15 words related to your job            Write 10-15 golfing terms          Write common places you go (I.e., gym, golf course)

## 2021-10-06 ENCOUNTER — Ambulatory Visit: Payer: Medicare HMO

## 2021-10-06 DIAGNOSIS — R4701 Aphasia: Secondary | ICD-10-CM

## 2021-10-06 DIAGNOSIS — R482 Apraxia: Secondary | ICD-10-CM | POA: Diagnosis not present

## 2021-10-06 DIAGNOSIS — R41841 Cognitive communication deficit: Secondary | ICD-10-CM | POA: Diagnosis not present

## 2021-10-06 NOTE — Patient Instructions (Signed)
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

## 2021-10-06 NOTE — Therapy (Signed)
[facility] [address] [place], [place], [address] Phone: [phone]   Fax:  [phone]  Speech Language Pathology Treatment  Patient Details  Name: Robert Mann MRN: [id number] Date of Birth: 1951-11-15 Referring Provider (SLP): Coralee Pesa. Shawn Route, PA-C   Encounter Date: 10/05/2021   End of Session - 10/06/21 1002     Visit Number 3    Number of Visits 25    Date for SLP Re-Evaluation 12/22/21    Authorization Type [employer]    SLP Start Time 1746    SLP Stop Time  2993    SLP Time Calculation (min) 44 min    Activity Tolerance Patient tolerated treatment well             Past Medical History:  Diagnosis Date   Essential hypertension    History of kidney stones    History of melanoma 01/2018   left chest   History of placement of chest tube 10/1979   History of pneumothorax 1980   Hyperlipidemia    Mild neurocognitive disorder, 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   Subjective Assessment - 10/06/21 1002     Subjective "fine"    Patient is accompained by: Family member    Currently in Pain? No/denies                   ADULT SLP TREATMENT - 10/06/21 0957       General Information   Behavior/Cognition Alert;Cooperative;Pleasant mood;Requires cueing      Treatment Provided   Treatment provided Cognitive-Linquistic      Cognitive-Linquistic Treatment   Treatment focused on Aphasia;Apraxia;Patient/family/caregiver education    Skilled Treatment SLP explained homework targeting high  frequency, personally relevant words with demonstration provided as wife requested further clarification. Both patient and wife indicated improved understanding. SLP inquired about patient background, in which usual anomia and extended processing time exhibited. His wife provided some appropriate cues, with SLP intermittnetly cuing wife to provide more assistance. SLP also emphasized patient benefitted from extra time, in which pt able to alert wife x1 when he needed additional time. Pt reported increased word finding in this setting versus at home/on the golf course. Pt's wife reported he has "white coat syndrome." SLP educated importance of practicing verbal expression in various settings and our role of initiating communication system to aid naming and recall in various settings and as PPA progresses. Both pt and wife verbalized understanding.      Assessment / Recommendations / Plan   Plan Continue with current plan of care      Progression Toward Goals   Progression toward goals Progressing toward goals              SLP Education - 10/06/21 1002     Education Details focus on communication supports, provides cues and extra time    Person(s) Educated Patient;Spouse    Methods Explanation;Demonstration;Handout;Verbal cues    Comprehension Verbalized understanding;Returned demonstration;Need further instruction;Verbal cues required              SLP Short Term  Goals - 10/06/21 1006       SLP SHORT TERM GOAL #1   Title pt will tell SLP three compensatory strategies for anomia/apraxia    Time 4    Period Weeks    Status On-going    Target Date 10/28/21   date extended due to holiday week     SLP SHORT TERM GOAL #2   Title pt family will demo appropriate cueing technique with rare min A from SLP    Time 4    Period Weeks    Status On-going    Target Date 10/28/21      SLP SHORT TERM GOAL #3   Title pt will initiate developement of device to assist his verbal communication     Time 4    Period Weeks    Status On-going    Target Date 10/28/21      SLP SHORT TERM GOAL #4   Title pt will complete linguistic homework 80% of the time requested    Time 4    Period Weeks    Status On-going    Target Date 10/28/21              SLP Long Term Goals - 10/06/21 1006       SLP LONG TERM GOAL #1   Title pt will reportedly use/use two different compensatory strategies for anomia/apraxia successfully in 10 minutes simple conversation, over 3 sessions    Time 8    Period Weeks    Status On-going    Target Date 11/18/21      SLP LONG TERM GOAL #2   Title pt will improve his score in outcome measure completed in first 2 therapy session (decr level of overall frustration about verbal communication)    Time 8    Period Weeks    Status On-going    Target Date 11/18/21      SLP LONG TERM GOAL #3   Title pt family will demo appropriate cueing techniques to assist pt verbal expression in 3 sessions    Time 8    Period Weeks    Status On-going    Target Date 11/25/21      SLP LONG TERM GOAL #4   Title pt will incr MLU in 2 minutes simple conversation to 6.5    Time 12    Period Weeks    Status New    Target Date 12/22/21      SLP LONG TERM GOAL #5   Title pt will indicate he talks more at work during end of treatment course than prior to ST    Time 12    Period Weeks    Status On-going    Target Date 12/22/21              Plan - 10/06/21 1003     Clinical Impression Statement 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 Completed further training in verbal compensations for apahsia re: initiating communication system and spouse trainging for cueing Robert Mann. Provided HEP to ID personally relevant words related to work and golf. Continue skilled ST to preserve remaining linguistic ability, use compensations for communication, caregiver training and plan for progression of aphasia.    Speech Therapy Frequency 2x / week     Duration 12 weeks    Treatment/Interventions Functional tasks;Compensatory techniques;Multimodal communcation approach;Cueing hierarchy;Language facilitation;SLP instruction and feedback;Internal/external aids;Patient/family education    Potential to Achieve Goals Good    Potential Considerations Medical prognosis  Consulted and Agree with Plan of Care Patient;Family member/caregiver             Patient will benefit from skilled therapeutic intervention in order to improve the following deficits and impairments:   Aphasia  Verbal apraxia    Problem List Patient Active Problem List   Diagnosis Date Noted   Mild neurocognitive disorder, 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 [place], [place], [address] Phone: [phone]   Fax:  [phone]   Name: Graciano Batson MRN: [id number] Date of Birth: Apr 09, 1951

## 2021-10-06 NOTE — Therapy (Signed)
[facility] [address] [place], [place], [address] Phone: [phone]   Fax:  [phone]  Speech Language Pathology Treatment  Patient Details  Name: Robert Mann MRN: [id number] Date of Birth: [birth year] Referring Provider (SLP): Coralee Pesa. Shawn Route, PA-C   Encounter Date: 10/06/2021   End of Session - 10/06/21 1711     Visit Number 4    Number of Visits 25    Date for SLP Re-Evaluation 12/22/21    Authorization Type [employer]    SLP Start Time 1620    SLP Stop Time  1701    SLP Time Calculation (min) 41 min    Activity Tolerance Patient tolerated treatment well             Past Medical History:  Diagnosis Date   Essential hypertension    History of kidney stones    History of melanoma 01/2018   left chest   History of placement of chest tube 10/1979   History of pneumothorax 1980   Hyperlipidemia    Mild neurocognitive disorder, 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   Subjective Assessment - 10/06/21 1705     Subjective presented homework    Patient is accompained by: Family member    Currently in Pain? No/denies                   ADULT SLP TREATMENT - 10/06/21 1705       General Information   Behavior/Cognition Alert;Cooperative;Pleasant mood;Requires cueing      Treatment Provided   Treatment provided Cognitive-Linquistic      Cognitive-Linquistic Treatment   Treatment focused on Aphasia;Apraxia;Patient/family/caregiver education    Skilled Treatment Pt returned with homework  completed with high frequency terms related to work, golfing, and favorite places. No overt difficulty exhibited for reading. SLP added these words to word doc to intiate communication system. Pt able to verbally detail weekly schedule and usual Saturday chores with occasional prompting and intermittent min to mod A for naming. Pt benefited from sentence starters, first letter written prompts, and semantic cues. Pt independently used description x1 and gestures x2, which increased with SLP cues. Pt exhibited difficulty recalling and naming medications, in which wallet medication card provided to be filled out next session. Of note, pt exhibited improved verbal expression with less frequent anomia and increased fluency.      Assessment / Recommendations / Plan   Plan Continue with current plan of care      Progression Toward Goals   Progression toward goals Progressing toward goals              SLP Education - 10/06/21 1710     Education Details initiation of communication support, compensations (sentence starters, writing first letter)    Person(s) Educated Patient;Spouse    Methods Explanation;Demonstration;Verbal cues;Handout    Comprehension Verbalized understanding;Returned demonstration;Need further instruction              SLP Short Term Goals - 10/06/21 1712       SLP SHORT TERM GOAL #1   Title  pt will tell SLP three compensatory strategies for anomia/apraxia    Time 4    Period Weeks    Status On-going    Target Date 10/28/21   date extended due to holiday week     SLP SHORT TERM GOAL #2   Title pt family will demo appropriate cueing technique with rare min A from SLP    Time 4    Period Weeks    Status On-going    Target Date 10/28/21      SLP SHORT TERM GOAL #3   Title pt will initiate developement of device to assist his verbal communication    Time 4    Period Weeks    Status On-going    Target Date 10/28/21      SLP SHORT TERM GOAL #4   Title pt will  complete linguistic homework 80% of the time requested    Time 4    Period Weeks    Status On-going    Target Date 10/28/21              SLP Long Term Goals - 10/06/21 1712       SLP LONG TERM GOAL #1   Title pt will reportedly use/use two different compensatory strategies for anomia/apraxia successfully in 10 minutes simple conversation, over 3 sessions    Time 8    Period Weeks    Status On-going    Target Date 11/18/21      SLP LONG TERM GOAL #2   Title pt will improve his score in outcome measure completed in first 2 therapy session (decr level of overall frustration about verbal communication)    Time 8    Period Weeks    Status On-going    Target Date 11/18/21      SLP LONG TERM GOAL #3   Title pt family will demo appropriate cueing techniques to assist pt verbal expression in 3 sessions    Time 8    Period Weeks    Status On-going    Target Date 11/25/21      SLP LONG TERM GOAL #4   Title pt will incr MLU in 2 minutes simple conversation to 6.5    Time 12    Period Weeks    Status New    Target Date 12/22/21      SLP LONG TERM GOAL #5   Title pt will indicate he talks more at work during end of treatment course than prior to ST    Time 12    Period Weeks    Status On-going    Target Date 12/22/21              Plan - 10/06/21 1711     Clinical Impression Statement 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 Completed further training in verbal compensations for aphasia and spouse trainging for cueing [name]. Initiated communication system via word doc. Provided HEP to ID personally relevant words related to garage and favorite foods. Continue skilled ST to preserve remaining linguistic ability, use compensations for communication, caregiver training and plan for progression of aphasia.    Speech Therapy Frequency 2x / week    Duration 12 weeks    Treatment/Interventions Functional tasks;Compensatory techniques;Multimodal  communcation approach;Cueing hierarchy;Language facilitation;SLP instruction and feedback;Internal/external aids;Patient/family education    Potential to Achieve Goals Good    Potential Considerations Medical prognosis    Consulted and Agree with Plan of Care Patient;Family member/caregiver  Patient will benefit from skilled therapeutic intervention in order to improve the following deficits and impairments:   Aphasia  Verbal apraxia    Problem List Patient Active Problem List   Diagnosis Date Noted   Mild neurocognitive disorder, 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 [place], [place], [address] Phone: [phone]   Fax:  [phone]   Name: Kionte Baumgardner MRN: [id number] Date of Birth: [birth year]

## 2021-10-07 ENCOUNTER — Other Ambulatory Visit: Payer: Self-pay | Admitting: Adult Health

## 2021-10-12 ENCOUNTER — Ambulatory Visit: Payer: Medicare HMO

## 2021-10-12 ENCOUNTER — Other Ambulatory Visit: Payer: Self-pay

## 2021-10-12 DIAGNOSIS — R4701 Aphasia: Secondary | ICD-10-CM | POA: Diagnosis not present

## 2021-10-12 DIAGNOSIS — R482 Apraxia: Secondary | ICD-10-CM | POA: Diagnosis not present

## 2021-10-12 DIAGNOSIS — R41841 Cognitive communication deficit: Secondary | ICD-10-CM | POA: Diagnosis not present

## 2021-10-12 NOTE — Patient Instructions (Addendum)
We worked on family member names today. We still need:  Name of Meredith's college   Wedding Anniversary year - 1978? Kate's sons names -  Christopher's daughters name Cloyde Reams & ?) -   Write down immediate family member's birthdays

## 2021-10-13 NOTE — Therapy (Signed)
[facility] [address] [place], [place], [address] Phone: [phone]   Fax:  [phone]  Speech Language Pathology Treatment  Patient Details  Name: Robert Mann MRN: [id number] Date of Birth: 1951-06-19 Referring Provider (SLP): Coralee Pesa. Shawn Route, PA-C   Encounter Date: 10/12/2021   End of Session - 10/12/21 1427     Visit Number 5    Number of Visits 25    Date for SLP Re-Evaluation 12/22/21    Authorization Type [employer]    SLP Start Time 1440    SLP Stop Time  1528    SLP Time Calculation (min) 48 min    Activity Tolerance Patient tolerated treatment well             Past Medical History:  Diagnosis Date   Essential hypertension    History of kidney stones    History of melanoma 01/2018   left chest   History of placement of chest tube 10/1979   History of pneumothorax 1980   Hyperlipidemia    Mild neurocognitive disorder, 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   Subjective Assessment - 10/12/21 1442     Subjective "I filled this out"    Currently in Pain? No/denies                   ADULT SLP TREATMENT - 10/12/21 1425       General Information   Behavior/Cognition Alert;Cooperative;Pleasant mood;Requires cueing      Treatment Provided   Treatment provided Cognitive-Linquistic      Cognitive-Linquistic Treatment   Treatment focused on Aphasia;Apraxia;Patient/family/caregiver education    Skilled Treatment Pt arrived alone. HEP completed for filling out wallet medication list  and naming favorite movies, foods, and tools in garage. SLP targeted further personally relevant topics to add to low-tech communication system. Intermittent anomia noted related to naming family members and details about them. Intermittent min to mod prompting to use descriptions and synonyms were effective. SLP reiterated using compensations to aid word finding and eventually utilization of communication support as PPA progresses.      Assessment / Recommendations / Plan   Plan Continue with current plan of care      Progression Toward Goals   Progression toward goals Progressing toward goals              SLP Education - 10/13/21 0836     Education Details HEP    Person(s) Educated Patient    Methods Explanation;Demonstration;Verbal cues;Handout    Comprehension Verbalized understanding;Returned demonstration;Need further instruction              SLP Short Term Goals - 10/12/21 1428       SLP SHORT TERM GOAL #1   Title pt will tell SLP three compensatory strategies for anomia/apraxia    Time 4    Period Weeks    Status On-going    Target Date 10/28/21   date extended due to holiday week     SLP SHORT TERM GOAL #2   Title pt family will demo appropriate cueing  technique with rare min A from SLP    Time 4    Period Weeks    Status On-going    Target Date 10/28/21      SLP SHORT TERM GOAL #3   Title pt will initiate developement of device to assist his verbal communication    Time 4    Period Weeks    Status On-going    Target Date 10/28/21      SLP SHORT TERM GOAL #4   Title pt will complete linguistic homework 80% of the time requested    Time 4    Period Weeks    Status On-going    Target Date 10/28/21              SLP Long Term Goals - 10/12/21 1428       SLP LONG TERM GOAL #1   Title pt will reportedly use/use two different compensatory strategies for anomia/apraxia successfully in 10 minutes simple conversation, over 3 sessions    Time 8     Period Weeks    Status On-going    Target Date 11/18/21      SLP LONG TERM GOAL #2   Title pt will improve his score in outcome measure completed in first 2 therapy session (decr level of overall frustration about verbal communication)    Time 8    Period Weeks    Status On-going    Target Date 11/18/21      SLP LONG TERM GOAL #3   Title pt family will demo appropriate cueing techniques to assist pt verbal expression in 3 sessions    Time 8    Period Weeks    Status On-going    Target Date 11/25/21      SLP LONG TERM GOAL #4   Title pt will incr MLU in 2 minutes simple conversation to 6.5    Time 12    Period Weeks    Status New    Target Date 12/22/21      SLP LONG TERM GOAL #5   Title pt will indicate he talks more at work during end of treatment course than prior to ST    Time 12    Period Weeks    Status On-going    Target Date 12/22/21              Plan - 10/12/21 1427     Clinical Impression Statement 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 Completed further training in verbal compensations for aphasia and modification of low tech communication system via word doc. Pt benefited from cued use of descriptions, synonyms, and extra time this session to aid naming. Continue skilled ST to preserve remaining linguistic ability, use compensations for communication, caregiver training and plan for progression of aphasia.    Speech Therapy Frequency 2x / week    Duration 12 weeks    Treatment/Interventions Functional tasks;Compensatory techniques;Multimodal communcation approach;Cueing hierarchy;Language facilitation;SLP instruction and feedback;Internal/external aids;Patient/family education    Potential to Achieve Goals Good    Potential Considerations Medical prognosis    Consulted and Agree with Plan of Care Patient;Family member/caregiver             Patient will benefit from skilled therapeutic intervention in order to improve the  following deficits and impairments:   Aphasia  Verbal apraxia    Problem List Patient Active Problem List   Diagnosis Date Noted   Mild neurocognitive disorder, 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   Primary progressive  aphasia 08/26/2021   Essential hypertension 08/25/2021   Hyperlipidemia 08/25/2021   History of kidney stones 08/25/2021   History of melanoma 01/2018    Alinda Deem, MA CCC-SLP 10/13/2021, 8:38 AM  [facility] [address] [place], [place], [address] Phone: [phone]   Fax:  [phone]   Name: Misael Mcgaha MRN: [id number] Date of Birth: Mar 16, 1951

## 2021-10-19 ENCOUNTER — Other Ambulatory Visit: Payer: Self-pay

## 2021-10-19 ENCOUNTER — Encounter: Payer: Self-pay | Admitting: Speech Pathology

## 2021-10-19 ENCOUNTER — Ambulatory Visit: Payer: Medicare HMO | Admitting: Speech Pathology

## 2021-10-19 DIAGNOSIS — R4701 Aphasia: Secondary | ICD-10-CM | POA: Diagnosis not present

## 2021-10-19 DIAGNOSIS — R482 Apraxia: Secondary | ICD-10-CM | POA: Diagnosis not present

## 2021-10-19 DIAGNOSIS — R41841 Cognitive communication deficit: Secondary | ICD-10-CM | POA: Diagnosis not present

## 2021-10-19 NOTE — Therapy (Signed)
[facility] [address] [place], [place], [address] Phone: [phone]   Fax:  [phone]  Speech Language Pathology Treatment  Patient Details  Name: [facility] MRN: [id number] Date of Birth: 1951-03-11 Referring Provider (SLP): Coralee Pesa. 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   Subjective Assessment - 10/19/21 1451     Subjective "It's coming out quicker, like that would have been 3-5 seconds"    Patient is accompained by: Family member    Currently in Pain? No/denies                   ADULT SLP TREATMENT - 10/19/21 1452       General Information   Behavior/Cognition Alert;Cooperative;Pleasant mood;Requires cueing      Treatment Provided   Treatment provided Cognitive-Linquistic      Cognitive-Linquistic Treatment   Treatment focused on Aphasia;Apraxia;Patient/family/caregiver education    Skilled Treatment HEP completed  accurately. Added to low tech AAC family birthdays and names. Targeted verbal compensations for word finding. [facility] generated 2-3 sentence description of basic objects - he reqiured cues 4x/20 description for more salient description. [facility] reports improved speech and language and confidence in conversing.      Assessment / Recommendations / Plan   Plan Continue with current plan of care      Progression Toward Goals   Progression toward goals Progressing toward goals              SLP Education - 10/19/21 1603     Education Details environmental compensations to maximize communication; compensations for word finding    Person(s) Educated Patient    Methods Explanation;Verbal cues;Handout;Demonstration    Comprehension Verbalized understanding;Returned demonstration;Verbal cues required;Need further instruction              SLP Short Term Goals - 10/19/21 1605       SLP SHORT TERM GOAL #1   Title pt will tell SLP three compensatory strategies for anomia/apraxia    Time 3    Period Weeks    Status On-going    Target Date 10/28/21   date extended due to holiday week     SLP SHORT TERM GOAL #2   Title pt family will demo appropriate cueing technique with rare min A from SLP  Time 3    Period Weeks    Status On-going    Target Date 10/28/21      SLP SHORT TERM GOAL #3   Title pt will initiate developement of device to assist his verbal communication    Time 3    Period Weeks    Status On-going    Target Date 10/28/21      SLP SHORT TERM GOAL #4   Title pt will complete linguistic homework 80% of the time requested    Time 3    Period Weeks    Status On-going    Target Date 10/28/21              SLP Long Term Goals - 10/19/21 1606       SLP LONG TERM GOAL #1   Title pt will reportedly use/use two different compensatory strategies for anomia/apraxia successfully in 10 minutes simple conversation, over 3 sessions    Time 7    Period Weeks    Status  On-going      SLP LONG TERM GOAL #2   Title pt will improve his score in outcome measure completed in first 2 therapy session (decr level of overall frustration about verbal communication)    Time 7    Period Weeks    Status On-going      SLP LONG TERM GOAL #3   Title pt family will demo appropriate cueing techniques to assist pt verbal expression in 3 sessions    Time 7    Period Weeks    Status On-going      SLP LONG TERM GOAL #4   Title pt will incr MLU in 2 minutes simple conversation to 6.5    Time 11    Period Weeks    Status On-going      SLP LONG TERM GOAL #5   Title pt will indicate he talks more at work during end of treatment course than prior to ST    Time 11    Period Weeks    Status On-going              Plan - 10/19/21 1605     Clinical Impression Statement 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 Completed further training in verbal compensations for aphasia and modification of low tech communication system via word doc. Pt benefited from cued use of descriptions, synonyms, and extra time this session to aid naming. Continue skilled ST to preserve remaining linguistic ability, use compensations for communication, caregiver training and plan for progression of aphasia.    Speech Therapy Frequency 2x / week    Duration 12 weeks    Treatment/Interventions Functional tasks;Compensatory techniques;Multimodal communcation approach;Cueing hierarchy;Language facilitation;SLP instruction and feedback;Internal/external aids;Patient/family education    Potential to Achieve Goals Good    Potential Considerations Medical prognosis             Patient will benefit from skilled therapeutic intervention in order to improve the following deficits and impairments:   Aphasia  Verbal apraxia    Problem List Patient Active Problem List   Diagnosis Date Noted   Mild neurocognitive disorder, 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   Primary progressive  aphasia 08/26/2021   Essential hypertension 08/25/2021   Hyperlipidemia 08/25/2021   History of kidney stones 08/25/2021   History of melanoma 01/2018    [place]pton Wixom, [name], CCC-SLP 10/19/2021, 4:08 PM  [facility] [address] [place], [place], [address] Phone: [phone]  Fax:  [phone]   Name: [facility] MRN: [id number] Date of Birth: 1951-05-13

## 2021-10-19 NOTE — Patient Instructions (Addendum)
  [name], you did a great job using description and giving clues today - try this out when you can't find a word  Group conversations are harder to process and participate in - if you can limit your dinners out 1 or 2 other couples  Use a gesture to indicate you want to talk - hold up your hand or finger  Get the persons attention before you speak  Use eye contact and face the person you are speaking to  Be in close proximity to the person you are speaking to  Turn down any noise in the environment such as the TV, walk away from loud appliances, air conditioners, fans, dish washers etc  In large gatherings, sit or stay on the side not the center of the room  Try to sit with a wall behind you or in a corner so noise isn't coming at you from all directions when dining out or attending gatherings  If you have airpods or blue tooth headphones, the Live Listen feature on you iPhone can sync with your pods to act as a microphone to enhance conversation near you or at your table (control center, hearing, AirPods, Live Listen)  Talk Path App on a tablet - you can change the levels to make it harder

## 2021-10-26 ENCOUNTER — Ambulatory Visit: Payer: Medicare HMO | Admitting: Speech Pathology

## 2021-10-26 ENCOUNTER — Other Ambulatory Visit: Payer: Self-pay

## 2021-10-26 DIAGNOSIS — R4701 Aphasia: Secondary | ICD-10-CM | POA: Diagnosis not present

## 2021-10-26 DIAGNOSIS — R41841 Cognitive communication deficit: Secondary | ICD-10-CM | POA: Diagnosis not present

## 2021-10-26 DIAGNOSIS — R482 Apraxia: Secondary | ICD-10-CM | POA: Diagnosis not present

## 2021-10-26 NOTE — Therapy (Signed)
[facility] [address] [place], [place], [address] Phone: [phone]   Fax:  [phone]  Speech Language Pathology Treatment  Patient Details  Name: [facility] MRN: [id number] Date of Birth: 07-18-1951 Referring Provider (SLP): [facility]. 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   Subjective Assessment - 10/26/21 1401     Subjective "It went well"    Patient is accompained by: Family member    Currently in Pain? No/denies                   ADULT SLP TREATMENT - 10/26/21 1502       General Information   Behavior/Cognition Alert;Cooperative;Pleasant mood;Requires cueing      Treatment Provided   Treatment provided Cognitive-Linquistic      Cognitive-Linquistic Treatment   Treatment focused on Aphasia;Apraxia;Patient/family/caregiver education    Skilled Treatment ST HW packet partially completed which is appropriate. Ongoing organization of low tech  AAC pages of personally relevant words to be used as a support for aphasia. Training of spouse and [facility] to use his "cheat sheets" when he can't think of words. Targeted written expression at word level and complex naming with given letters in a category. [facility] wrote/named 15/20 items in category with occasional min verbal cues from his spouse.      Assessment / Recommendations / Plan   Plan Continue with current plan of care      Progression Toward Goals   Progression toward goals Progressing toward goals              SLP Education - 10/26/21 1505     Education Details use cheat sheet as needed              SLP Short Term Goals - 10/26/21 1506       SLP SHORT TERM GOAL #1   Title pt will tell SLP three compensatory strategies for anomia/apraxia    Time 2    Period Weeks    Status On-going    Target Date 10/28/21   date extended due to holiday week     SLP SHORT TERM GOAL #2   Title pt family will demo appropriate cueing technique with rare min A from SLP    Baseline 10/26/21;    Time 2  Period Weeks    Status On-going    Target Date 10/28/21      SLP SHORT TERM GOAL #3   Title pt will initiate developement of device to assist his verbal communication    Time 2    Period Weeks    Status Achieved    Target Date 10/28/21      SLP SHORT TERM GOAL #4   Title pt will complete linguistic homework 80% of the time requested    Time 2    Period Weeks    Status Achieved    Target Date 10/28/21              SLP Long Term Goals - 10/26/21 1507       SLP LONG TERM GOAL #1   Title pt will reportedly use/use two different compensatory strategies for anomia/apraxia successfully in 10 minutes simple conversation, over 3 sessions    Time 6    Period Weeks    Status On-going      SLP LONG TERM GOAL #2   Title pt will improve his score in outcome measure completed in first 2 therapy session (decr level of overall frustration about verbal communication)    Time 6     Period Weeks    Status On-going      SLP LONG TERM GOAL #3   Title pt family will demo appropriate cueing techniques to assist pt verbal expression in 3 sessions    Time 6    Period Weeks    Status On-going      SLP LONG TERM GOAL #4   Title pt will incr MLU in 2 minutes simple conversation to 6.5    Time 10    Period Weeks    Status On-going      SLP LONG TERM GOAL #5   Title pt will indicate he talks more at work during end of treatment course than prior to ST    Time 10    Period Weeks    Status On-going              Plan - 10/26/21 1505     Clinical Impression Statement 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 Completed further training in verbal compensations for aphasia and modification of low tech communication system via word doc. Training pt and spouse to use AAC pages as needed. Pt benefited from cued use of descriptions, synonyms, and extra time this session to aid naming. Continue skilled ST to preserve remaining linguistic ability, use compensations for communication, caregiver training and plan for progression of aphasia.    Speech Therapy Frequency 2x / week    Duration 12 weeks    Treatment/Interventions Functional tasks;Compensatory techniques;Multimodal communcation approach;Cueing hierarchy;Language facilitation;SLP instruction and feedback;Internal/external aids;Patient/family education    Potential to Achieve Goals Good    Potential Considerations Medical prognosis             Patient will benefit from skilled therapeutic intervention in order to improve the following deficits and impairments:   Aphasia  Verbal apraxia    Problem List Patient Active Problem List   Diagnosis Date Noted   Mild neurocognitive disorder, 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

## 2021-10-28 ENCOUNTER — Other Ambulatory Visit: Payer: Self-pay

## 2021-10-28 ENCOUNTER — Ambulatory Visit: Payer: Medicare HMO

## 2021-10-28 ENCOUNTER — Ambulatory Visit: Payer: Medicare HMO | Attending: Adult Health

## 2021-10-28 DIAGNOSIS — R4701 Aphasia: Secondary | ICD-10-CM | POA: Insufficient documentation

## 2021-10-28 DIAGNOSIS — R482 Apraxia: Secondary | ICD-10-CM | POA: Diagnosis not present

## 2021-10-28 NOTE — Therapy (Signed)
[facility] [address] [place], [place], [address] Phone: [phone]   Fax:  [phone]  Speech Language Pathology Treatment  Patient Details  Name: [facility] MRN: [id number] Date of Birth: 20-Jan-1951 Referring Provider (SLP): Coralee Pesa. Shawn Route, PA-C   Encounter Date: 10/28/2021   End of Session - 10/28/21 1542     Visit Number 8    Number of Visits 25    Date for SLP Re-Evaluation 12/22/21    Authorization Type [employer]    SLP Start Time 1318    SLP Stop Time  1400    SLP Time Calculation (min) 42 min    Activity Tolerance Patient tolerated treatment well             Past Medical History:  Diagnosis Date   Essential hypertension    History of kidney stones    History of melanoma 01/2018   left chest   History of placement of chest tube 10/1979   History of pneumothorax 1980   Hyperlipidemia    Mild neurocognitive disorder, 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   Subjective Assessment - 10/28/21 1320     Subjective "more confident"    Currently in Pain? No/denies                   ADULT SLP TREATMENT - 10/28/21 1324       General Information   Behavior/Cognition Alert;Cooperative;Pleasant mood;Requires cueing      Treatment Provided   Treatment provided Cognitive-Linquistic      Cognitive-Linquistic Treatment   Treatment focused on Aphasia;Apraxia;Patient/family/caregiver education    Skilled Treatment Increased confidence and improved verbal expression reported by patient  today. ST HW packet partially completed which is appropriate. Pt believes he has benefited from HEP despite being challenging. Pt's wife continues to provide cues versus words, per patient. SLP reeducated rationale for his "cheat sheets" when he can't think of words. Pt verbalized understanding. Pt unable to verbalize any anomia strategies when prompted, but pt able to occasionally use descriptions and synonyms in conversation. Extra time is also successful. In divergent naming task, pt was 58% accurate independently, which improved to 100% accuracy with mod phonemic and semantic cues.      Assessment / Recommendations / Plan   Plan Continue with current plan of care      Progression Toward Goals   Progression toward goals Progressing toward goals              SLP Education - 10/28/21 1541     Education Details use cheat sheet, anomia strats    Person(s) Educated Patient    Methods Demonstration;Explanation;Verbal cues    Comprehension Verbalized understanding;Returned demonstration;Need further instruction              SLP Short Term Goals - 10/28/21 1340       SLP SHORT TERM GOAL #1   Title pt will tell SLP three compensatory strategies for anomia/apraxia    Status Not Met    Target Date 10/28/21   date extended due to holiday  week     SLP SHORT TERM GOAL #2   Title pt family will demo appropriate cueing technique with rare min A from SLP    Baseline 10/26/21    Status Achieved    Target Date 10/28/21      SLP SHORT TERM GOAL #3   Title pt will initiate developement of device to assist his verbal communication    Status Achieved    Target Date 10/28/21      SLP SHORT TERM GOAL #4   Title pt will complete linguistic homework 80% of the time requested    Status Achieved    Target Date 10/28/21              SLP Long Term Goals - 10/28/21 1342       SLP LONG TERM GOAL #1   Title pt will reportedly use/use two different compensatory strategies for  anomia/apraxia successfully in 10 minutes simple conversation, over 3 sessions    Time 6    Period Weeks    Status On-going      SLP LONG TERM GOAL #2   Title pt will improve his score in outcome measure completed in first 2 therapy session (decr level of overall frustration about verbal communication)    Time 6    Period Weeks    Status On-going      SLP LONG TERM GOAL #3   Title pt family will demo appropriate cueing techniques to assist pt verbal expression in 3 sessions    Time 6    Period Weeks    Status On-going      SLP LONG TERM GOAL #4   Title pt will incr MLU in 2 minutes simple conversation to 6.5    Time 10    Period Weeks    Status On-going      SLP LONG TERM GOAL #5   Title pt will indicate he talks more at work during end of treatment course than prior to ST    Time 10    Period Weeks    Status On-going              Plan - 10/28/21 1542     Clinical Impression Statement 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 Completed further training in verbal compensations for aphasia and usage of low tech communication system. Pt benefited from cued use of descriptions, synonyms, and extra time this session to aid naming. Continue skilled ST to preserve remaining linguistic ability, use compensations for communication, caregiver training and plan for progression of aphasia.    Speech Therapy Frequency 2x / week    Duration 12 weeks    Treatment/Interventions Functional tasks;Compensatory techniques;Multimodal communcation approach;Cueing hierarchy;Language facilitation;SLP instruction and feedback;Internal/external aids;Patient/family education    Potential to Achieve Goals Good    Potential Considerations Medical prognosis             Patient will benefit from skilled therapeutic intervention in order to improve the following deficits and impairments:   Aphasia  Verbal apraxia    Problem List Patient Active Problem List    Diagnosis Date Noted   Mild neurocognitive disorder, 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 [place], [place], [address] Phone: [phone]  Fax:  [phone]   Name: Braeden Kennan MRN: [id number] Date of Birth: 1951-05-13

## 2021-11-09 ENCOUNTER — Other Ambulatory Visit: Payer: Self-pay

## 2021-11-09 ENCOUNTER — Ambulatory Visit: Payer: Medicare HMO | Admitting: Speech Pathology

## 2021-11-09 ENCOUNTER — Encounter: Payer: Self-pay | Admitting: Speech Pathology

## 2021-11-09 DIAGNOSIS — R4701 Aphasia: Secondary | ICD-10-CM

## 2021-11-09 DIAGNOSIS — R482 Apraxia: Secondary | ICD-10-CM

## 2021-11-09 NOTE — Therapy (Signed)
[facility] [address] [place], [place], [address] Phone: [phone]   Fax:  [phone]  Speech Language Pathology Treatment  Patient Details  Name: Robert Mann MRN: [id number] Date of Birth: 1951-09-10 Referring Provider (SLP): Robert Pesa. Shawn Route, PA-C   Encounter Date: 11/09/2021   End of Session - 11/09/21 1615     Visit Number 9    Number of Visits 25    Date for SLP Re-Evaluation 12/22/21    Authorization Type [employer]    SLP Start Time 1400    SLP Stop Time  7619    SLP Time Calculation (min) 45 min    Activity Tolerance Patient tolerated treatment well             Past Medical History:  Diagnosis Date   Essential hypertension    History of kidney stones    History of melanoma 01/2018   left chest   History of placement of chest tube 10/1979   History of pneumothorax 1980   Hyperlipidemia    Mild neurocognitive disorder, 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   Subjective Assessment - 11/09/21 1404     Subjective "Good"    Currently in Pain? No/denies                   ADULT SLP TREATMENT - 11/09/21 1404       General Information   Behavior/Cognition Alert;Cooperative;Pleasant mood;Requires cueing      Treatment Provided   Treatment provided Cognitive-Linquistic      Cognitive-Linquistic Treatment   Treatment focused on Aphasia;Apraxia;Patient/family/caregiver education    Skilled Treatment [name] reports using his cheat sheets when needed and that [name] helps him as well  by giving him cues. He reports talking more at work and continues to report more confidence talking. He is not avoiding communication situations. Today, word finding and sentence generation targeted with VNeST. [name] generate 3 subjects and objects for 3 verbs, independently for 1st subject-object (s-o) and reqiured occasional min verbal and written cues to generate 2/3 s-o. He generated 3 complex sentences with rare min A. [name] told story about when he was born, over 5  minutes with 2 requests for clarification, and told a story about a long trip he took with his brother out [place] when they were in college, 8 minutes, 1 request for clarification. [name] did attempt compensations for aphasia in this conversation 3x, however more frequently after extended pause he came up with the word or a related word to get his message understood.      Assessment / Recommendations / Plan   Plan Continue with current plan of care      Progression Toward Goals   Progression toward goals Progressing toward goals                SLP Short Term Goals - 11/09/21 1614       SLP SHORT TERM GOAL #1   Title pt will tell SLP three compensatory strategies for anomia/apraxia    Status Not Met    Target Date  10/28/21   date extended due to holiday week     SLP SHORT TERM GOAL #2   Title pt family will demo appropriate cueing technique with rare min A from SLP    Baseline 10/26/21    Status Achieved    Target Date 10/28/21      SLP SHORT TERM GOAL #3   Title pt will initiate developement of device to assist his verbal communication    Status Achieved    Target Date 10/28/21      SLP SHORT TERM GOAL #4   Title pt will complete linguistic homework 80% of the time requested    Status Achieved    Target Date 10/28/21              SLP Long Term Goals - 11/09/21 1614       SLP LONG TERM GOAL #1   Title pt will reportedly use/use two different compensatory strategies for anomia/apraxia successfully in 10  minutes simple conversation, over 3 sessions    Time 5    Period Weeks    Status On-going      SLP LONG TERM GOAL #2   Title pt will improve his score in outcome measure completed in first 2 therapy session (decr level of overall frustration about verbal communication)    Time 5    Period Weeks    Status On-going      SLP LONG TERM GOAL #3   Title pt family will demo appropriate cueing techniques to assist pt verbal expression in 3 sessions    Time 5    Period Weeks    Status On-going      SLP LONG TERM GOAL #4   Title pt will incr MLU in 2 minutes simple conversation to 6.5    Time 9    Period Weeks    Status On-going      SLP LONG TERM GOAL #5   Title pt will indicate he talks more at work during end of treatment course than prior to [place] 11/09/21    Time 9    Period Weeks    Status On-going              Plan - 11/09/21 1614     Clinical Impression Statement 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 Completed further training in verbal compensations for aphasia and usage of low tech communication system. Pt benefited from cued use of descriptions, synonyms, and extra time this session to aid naming. Continue skilled ST to preserve remaining linguistic ability, use compensations for communication, caregiver training and plan for progression of aphasia.    Speech Therapy Frequency 2x / week    Duration 12 weeks    Treatment/Interventions Functional tasks;Compensatory techniques;Multimodal communcation approach;Cueing hierarchy;Language facilitation;SLP instruction and feedback;Internal/external aids;Patient/family education    Potential to Achieve Goals Good    Potential Considerations Medical prognosis             Patient will benefit from skilled therapeutic intervention in order to improve the following deficits and impairments:   Aphasia  Verbal apraxia    Problem List Patient Active Problem List   Diagnosis Date  Noted   Mild neurocognitive disorder, 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

## 2021-11-11 ENCOUNTER — Other Ambulatory Visit: Payer: Self-pay

## 2021-11-11 ENCOUNTER — Ambulatory Visit: Payer: Medicare HMO

## 2021-11-11 DIAGNOSIS — R482 Apraxia: Secondary | ICD-10-CM | POA: Diagnosis not present

## 2021-11-11 DIAGNOSIS — R4701 Aphasia: Secondary | ICD-10-CM | POA: Diagnosis not present

## 2021-11-11 NOTE — Therapy (Signed)
[facility] [address] [place], [place], [address] Phone: [phone]   Fax:  [phone]  Speech Language Pathology Treatment/Progress Note  Patient Details  Name: Robert Mann MRN: [id number] Date of Birth: [birth year] Referring Provider (SLP): Coralee Pesa. Shawn Route, PA-C   Encounter Date: 11/11/2021   End of Session - 11/11/21 1351     Visit Number 10    Number of Visits 25    Date for SLP Re-Evaluation 12/22/21    Authorization Type [employer] Medicare    SLP Start Time 1355    SLP Stop Time  1435    SLP Time Calculation (min) 40 min    Activity Tolerance Patient tolerated treatment well             Past Medical History:  Diagnosis Date   Essential hypertension    History of kidney stones    History of melanoma 01/2018   left chest   History of placement of chest tube 10/1979   History of pneumothorax 1980   Hyperlipidemia    Mild neurocognitive disorder, 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  Speech Therapy Progress Note  Dates of Reporting Period: 09-23-21 to current  Objective Reports of Subjective Statement: Pt has been seen for 10 ST visits targeting aphasia and verbal apraxia. Pt subjectively reports improvements in verbal expression and increased confidence when conversing.   Objective Measurements: Pt exhibits less frequent anomia with pt now able to compensate more effectively when word finding occurs. External communication supports created and provided to augment verbal expression as PPA progresses.    Goal Update: see goals below  Plan: continue per POC  Reason Skilled Services are Required: continue skilled ST intervention as pt is progressing towards goals and benefiting from ST intervention   Subjective Assessment - 11/11/21 1354     Subjective "it's good"    Currently in Pain? No/denies                   ADULT SLP TREATMENT - 11/11/21 1351       General Information   Behavior/Cognition Alert;Cooperative;Pleasant mood;Requires cueing      Treatment Provided   Treatment provided Cognitive-Linquistic      Cognitive-Linquistic Treatment   Treatment focused on Aphasia;Apraxia;Patient/family/caregiver education    Skilled Treatment Robert Mann continues to report improvements in verbal expression and confidence when conversing. Pt exhibited paraphasia x1 when retelling story from last session. SLP assisted with phonemic cue to correct error. SLP targeted word finding task to fill in multiple blanks to create sentences. Pt benefited from extra time and occasional mod cues to aid naming to complete task. SLP cued use of description strategy x2 when pt stated he couldn't get the word out, which was effective.      Assessment / Recommendations / Plan   Plan Continue with current plan of care      Progression Toward Goals   Progression toward goals Progressing toward goals              SLP Education - 11/11/21 1419  Education Details HEP, compensations    Person(s) Educated Patient    Methods Explanation;Demonstration;Verbal cues;Handout    Comprehension Verbalized understanding;Returned demonstration;Need further instruction              SLP Short Term Goals - 11/09/21 1614       SLP SHORT TERM GOAL #1   Title pt will tell SLP three compensatory strategies for anomia/apraxia    Status Not Met    Target Date 10/28/21   date extended due to holiday week     SLP SHORT TERM GOAL #2   Title pt family will demo appropriate cueing technique with rare min A  from SLP    Baseline 10/26/21    Status Achieved    Target Date 10/28/21      SLP SHORT TERM GOAL #3   Title pt will initiate developement of device to assist his verbal communication    Status Achieved    Target Date 10/28/21      SLP SHORT TERM GOAL #4   Title pt will complete linguistic homework 80% of the time requested    Status Achieved    Target Date 10/28/21              SLP Long Term Goals - 11/11/21 1352       SLP LONG TERM GOAL #1   Title pt will reportedly use/use two different compensatory strategies for anomia/apraxia successfully in 10 minutes simple conversation, over 3 sessions    Time 5    Period Weeks    Status On-going      SLP LONG TERM GOAL #2   Title pt will improve his score in outcome measure completed in first 2 therapy session (decr level of overall frustration about verbal communication)    Time 5    Period Weeks    Status On-going      SLP LONG TERM GOAL #3   Title pt family will demo appropriate cueing techniques to assist pt verbal expression in 3 sessions    Time 5    Period Weeks    Status On-going      SLP LONG TERM GOAL #4   Title pt will incr MLU in 2 minutes simple conversation to 6.5    Time 9    Period Weeks    Status On-going      SLP LONG TERM GOAL #5   Title pt will indicate he talks more at work during end of treatment course than prior to [place] 11/09/21    Time 9    Period Weeks    Status On-going              Plan - 11/11/21 1352     Clinical Impression Statement 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 Completed ongoing education and training in verbal compensations for aphasia and usage of low tech communication system. Pt benefited from cued use of descriptions, synonyms, and extra time this session to aid naming. Continue skilled ST to preserve remaining linguistic ability, use compensations for communication, caregiver training and plan for progression of aphasia.     Speech Therapy Frequency 2x / week    Duration 12 weeks    Treatment/Interventions Functional tasks;Compensatory techniques;Multimodal communcation approach;Cueing hierarchy;Language facilitation;SLP instruction and feedback;Internal/external aids;Patient/family education    Potential to Achieve Goals Good    Potential Considerations Medical prognosis             Patient will benefit from  skilled therapeutic intervention in order to improve the following deficits and impairments:   Aphasia  Verbal apraxia    Problem List Patient Active Problem List   Diagnosis Date Noted   Mild neurocognitive disorder, 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 [place], [place], [address] Phone: [phone]   Fax:  [phone]   Name: Robert Mann MRN: [id number] Date of Birth: 02-16-1951

## 2021-11-15 ENCOUNTER — Telehealth: Payer: Self-pay | Admitting: Adult Health

## 2021-11-15 NOTE — Telephone Encounter (Signed)
Left message for patient to call back and schedule Medicare Annual Wellness Visit (AWV) either virtually or in office. Left  my Herbie Drape number [phone]   Last AWV 12/31/19  please schedule at anytime with [facility] Nurse Health Advisor 1 or 2   This should be a 45 minute visit.

## 2021-11-16 ENCOUNTER — Ambulatory Visit: Payer: Medicare HMO | Admitting: Speech Pathology

## 2021-11-16 ENCOUNTER — Encounter: Payer: Self-pay | Admitting: Speech Pathology

## 2021-11-16 ENCOUNTER — Other Ambulatory Visit: Payer: Self-pay

## 2021-11-16 DIAGNOSIS — R482 Apraxia: Secondary | ICD-10-CM | POA: Diagnosis not present

## 2021-11-16 DIAGNOSIS — R4701 Aphasia: Secondary | ICD-10-CM

## 2021-11-16 NOTE — Therapy (Signed)
[facility] [address] [place], [place], [address] Phone: [phone]   Fax:  [phone]  Speech Language Pathology Treatment  Patient Details  Name: Robert Mann MRN: [id number] Date of Birth: Jul 02, 1951 Referring Provider (SLP): Coralee Pesa. Shawn Route, PA-C   Encounter Date: 11/16/2021   End of Session - 11/16/21 1551     Visit Number 11    Number of Visits 25    Date for SLP Re-Evaluation 12/22/21    Authorization Type [employer] Medicare    SLP Start Time 1400    SLP Stop Time  1638    SLP Time Calculation (min) 45 min    Activity Tolerance Patient tolerated treatment well             Past Medical History:  Diagnosis Date   Essential hypertension    History of kidney stones    History of melanoma 01/2018   left chest   History of placement of chest tube 10/1979   History of pneumothorax 1980   Hyperlipidemia    Mild neurocognitive disorder, 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   Subjective Assessment - 11/16/21 1412     Subjective "going good"    Currently in Pain? No/denies                   ADULT SLP TREATMENT - 11/16/21 1413       General Information   Behavior/Cognition Alert;Cooperative;Pleasant mood;Requires cueing      Treatment Provided   Treatment provided Cognitive-Linquistic      Cognitive-Linquistic Treatment   Treatment focused on Aphasia;Apraxia;Patient/family/caregiver education    Skilled Treatment [name] reports success making phone calls, being overwhelmed with a larger  order for extended family from a resaurant, but did place order successfully. MLU has improved to 7-8. [name] reports 75% reduction in frustration communicating since starting ST. He genrated 3 complex sentences with VNeST with mod I and occasional extended time.      Assessment / Recommendations / Plan   Plan Continue with current plan of care;Other (Comment)   reduce to 1x a week     Progression Toward Goals   Progression toward goals Progressing toward goals                SLP Short Term Goals - 11/16/21 1550       SLP SHORT TERM GOAL #1   Title pt will tell SLP three compensatory strategies for anomia/apraxia    Status Not Met    Target Date 10/28/21   date extended due to holiday week     SLP SHORT TERM GOAL #2   Title pt family will demo appropriate cueing technique with rare min A from SLP    Baseline 10/26/21    Status Achieved    Target Date 10/28/21      SLP SHORT TERM GOAL #3   Title pt will initiate developement of device to assist his verbal communication    Status Achieved    Target Date 10/28/21      SLP SHORT TERM GOAL #4  Title pt will complete linguistic homework 80% of the time requested    Status Achieved    Target Date 10/28/21              SLP Long Term Goals - 11/16/21 1550       SLP LONG TERM GOAL #1   Title pt will reportedly use/use two different compensatory strategies for anomia/apraxia successfully in 10 minutes simple conversation, over 3 sessions    Baseline 11/16/21    Time 4    Period Weeks    Status On-going      SLP LONG TERM GOAL #2   Title pt will improve his score in outcome measure completed in first 2 therapy session (decr level of overall frustration about verbal communication)    Time 5    Period Weeks    Status Achieved      SLP LONG TERM GOAL #3   Title pt family will demo appropriate cueing techniques to assist pt verbal expression in 3 sessions    Baseline 11/16/21 - per pt report    Time 4    Period Weeks     Status On-going      SLP LONG TERM GOAL #4   Title pt will incr MLU in 2 minutes simple conversation to 6.5    Baseline 11/16/21    Time 8    Period Weeks    Status On-going      SLP LONG TERM GOAL #5   Title pt will indicate he talks more at work during end of treatment course than prior to [place] 11/09/21, 11/16/21    Time 8    Period Weeks    Status Achieved              Plan - 11/16/21 1545     Clinical Impression Statement 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 Pt is carrying over both verbal and low tech AAC strategies to improve ease of communication. He has reduced avoidance of talking at work and over the phone and reports 75% reduction in frustration communicating. [name] is motivated and completes [employer] and HEP consisitently. Due to progress in ST, reduce to 1x a week for remaining 3 weeks. [name] is in agreement. Continue skilled ST to preserve remaining linguistic ability, train pt and caregiver in compensations for aphasia, and plan for progression of aphasia    Speech Therapy Frequency 1x /week    Duration 12 weeks    Treatment/Interventions Functional tasks;Compensatory techniques;Multimodal communcation approach;Cueing hierarchy;Language facilitation;SLP instruction and feedback;Internal/external aids;Patient/family education    Potential to Achieve Goals Good    Potential Considerations Medical prognosis             Patient will benefit from skilled therapeutic intervention in order to improve the following deficits and impairments:   Aphasia  Verbal apraxia    Problem List Patient Active Problem List   Diagnosis Date Noted   Mild neurocognitive disorder, 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 [place], [place], [address] Phone: [phone]   Fax:  [phone]   Name: Robert Mann MRN: [id number] Date of Birth: 03-27-1951

## 2021-11-18 ENCOUNTER — Ambulatory Visit: Payer: Medicare HMO

## 2021-11-23 ENCOUNTER — Encounter: Payer: Medicare HMO | Admitting: Speech Pathology

## 2021-11-25 ENCOUNTER — Other Ambulatory Visit: Payer: Self-pay

## 2021-11-25 ENCOUNTER — Ambulatory Visit: Payer: Medicare HMO

## 2021-11-25 DIAGNOSIS — R482 Apraxia: Secondary | ICD-10-CM | POA: Diagnosis not present

## 2021-11-25 DIAGNOSIS — R4701 Aphasia: Secondary | ICD-10-CM

## 2021-11-25 NOTE — Therapy (Signed)
[facility] [address] [place], [place], [address] Phone: [phone]   Fax:  [phone]  Speech Language Pathology Treatment  Patient Details  Name: Robert Mann MRN: [id number] Date of Birth: Dec 04, 1950 Referring Provider (SLP): Coralee Pesa. Shawn Route, PA-C   Encounter Date: 11/25/2021   End of Session - 11/25/21 1342     Visit Number 12    Number of Visits 25    Date for SLP Re-Evaluation 12/22/21    Authorization Type [employer]    SLP Start Time 1345    SLP Stop Time  1430    SLP Time Calculation (min) 45 min    Activity Tolerance Patient tolerated treatment well             Past Medical History:  Diagnosis Date   Essential hypertension    History of kidney stones    History of melanoma 01/2018   left chest   History of placement of chest tube 10/1979   History of pneumothorax 1980   Hyperlipidemia    Mild neurocognitive disorder, 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   Subjective Assessment - 11/25/21 1344     Subjective "I need some prompt on what to do" pt brought in tablet    Currently in Pain? No/denies                   ADULT SLP TREATMENT - 11/25/21 1342       General Information   Behavior/Cognition Alert;Cooperative;Pleasant mood;Requires cueing      Treatment Provided   Treatment provided Cognitive-Linquistic      Cognitive-Linquistic Treatment   Treatment focused on Aphasia;Apraxia;Patient/family/caregiver education    Skilled Treatment Pt recevied tablet for Christmas  and requested SLP assistance to set up recommended app. SLP provided A for TalkPath Therapy app. SLP trialed speaking and writing categories, in which pt able to complete with rare min A. SLP demonstrated how to provide hints via app as needed. SLP also provided recommendations for additional apps, including Click AAC. Click AAC app malfunctioned during today's trial. Pt reported some discomfort using technology, in which SLP encouraged to continue to trial, especially as PPA progresses. Pt will bring tablet to next ST session.      Assessment / Recommendations / Plan   Plan Continue with current plan of care     Progression Toward Goals   Progression toward goals Progressing toward goals              SLP Education - 11/25/21 1351     Education Details Talk Path app set up    Person(s) Educated Patient    Methods Explanation;Demonstration;Handout;Verbal cues    Comprehension Verbalized understanding;Returned demonstration;Need further instruction              SLP Short Term Goals - 11/16/21 1550       SLP SHORT TERM GOAL #1   Title pt will tell SLP three compensatory strategies for anomia/apraxia    Status Not Met    Target Date 10/28/21   date extended due to holiday  week     SLP SHORT TERM GOAL #2   Title pt family will demo appropriate cueing technique with rare min A from SLP    Baseline 10/26/21    Status Achieved    Target Date 10/28/21      SLP SHORT TERM GOAL #3   Title pt will initiate developement of device to assist his verbal communication    Status Achieved    Target Date 10/28/21      SLP SHORT TERM GOAL #4   Title pt will complete linguistic homework 80% of the time requested    Status Achieved    Target Date 10/28/21              SLP Long Term Goals - 11/25/21 1343       SLP LONG TERM GOAL #1   Title pt will reportedly use/use two different compensatory strategies for anomia/apraxia successfully in 10 minutes simple conversation, over 3  sessions    Baseline 11/16/21, 11-25-21    Time 3    Period Weeks    Status On-going      SLP LONG TERM GOAL #2   Title pt will improve his score in outcome measure completed in first 2 therapy session (decr level of overall frustration about verbal communication)    Time --    Period --    Status Achieved      SLP LONG TERM GOAL #3   Title pt family will demo appropriate cueing techniques to assist pt verbal expression in 3 sessions    Baseline 11/16/21 - per pt report    Time 3    Period Weeks    Status On-going      SLP LONG TERM GOAL #4   Title pt will incr MLU in 2 minutes simple conversation to 6.5    Baseline 11/16/21    Time 7    Period Weeks    Status On-going      SLP LONG TERM GOAL #5   Title pt will indicate he talks more at work during end of treatment course than prior to [place] 11/09/21, 11/16/21    Time --    Period --    Status Achieved              Plan - 11/25/21 1342     Clinical Impression Statement 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 Pt is carrying over both verbal and low tech AAC strategies to improve ease of communication. He has reduced avoidance of talking at work and over the phone and reports 75% reduction in frustration communicating. [name] is motivated and completes [employer] and HEP consisitently.  Continue skilled ST to preserve remaining linguistic ability, train pt and caregiver in compensations for aphasia, and plan for progression of aphasia    Speech Therapy Frequency 1x /week    Duration 12 weeks    Treatment/Interventions Functional tasks;Compensatory techniques;Multimodal communcation approach;Cueing hierarchy;Language facilitation;SLP instruction and feedback;Internal/external aids;Patient/family education    Potential to Achieve Goals Good    Potential Considerations Medical prognosis             Patient will benefit from skilled therapeutic intervention in order to improve the following deficits  and impairments:   Aphasia  Verbal apraxia    Problem List Patient Active Problem List   Diagnosis Date Noted   Mild neurocognitive disorder, 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

## 2021-11-30 ENCOUNTER — Ambulatory Visit: Payer: Medicare HMO | Attending: Adult Health | Admitting: Speech Pathology

## 2021-11-30 ENCOUNTER — Other Ambulatory Visit: Payer: Self-pay

## 2021-11-30 ENCOUNTER — Encounter: Payer: Self-pay | Admitting: Speech Pathology

## 2021-11-30 DIAGNOSIS — R482 Apraxia: Secondary | ICD-10-CM | POA: Diagnosis not present

## 2021-11-30 DIAGNOSIS — R4701 Aphasia: Secondary | ICD-10-CM | POA: Insufficient documentation

## 2021-11-30 NOTE — Therapy (Signed)
[facility] [address] [place], [place], [address] Phone: [phone]   Fax:  [phone]  Speech Language Pathology Treatment  Patient Details  Name: Robert Mann MRN: [id number] Date of Birth: 06/11/1951 Referring Provider (SLP): Coralee Pesa. Shawn Route, PA-C   Encounter Date: 11/30/2021   End of Session - 11/30/21 1457     Visit Number 13    Number of Visits 25    Date for SLP Re-Evaluation 12/22/21    Authorization Type [employer] Medicare    SLP Start Time 1400    SLP Stop Time  3428    SLP Time Calculation (min) 43 min    Activity Tolerance Patient tolerated treatment well             Past Medical History:  Diagnosis Date   Essential hypertension    History of kidney stones    History of melanoma 01/2018   left chest   History of placement of chest tube 10/1979   History of pneumothorax 1980   Hyperlipidemia    Mild neurocognitive disorder, 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   Subjective Assessment - 11/30/21 1439     Subjective "I signed in but now I can't get back in"    Currently in Pain? No/denies                   ADULT SLP TREATMENT - 11/30/21 1439       General Information   Behavior/Cognition Alert;Cooperative;Pleasant mood;Requires cueing      Treatment Provided   Treatment provided Cognitive-Linquistic      Cognitive-Linquistic Treatment   Treatment focused on Aphasia;Apraxia;Patient/family/caregiver education    Skilled Treatment Pt required A to sign back into talk path app.  Reset password and user - after initial modeling, [name] accessed the app with min cues. Targeted verbal compensations for aphasia using semantic feature analysis "tell me more" exercise in talk Path app. [name] required cues to name category 3/7x, He generated 1 description of objects with questioning cues and named what it was used for 7x with mod I and extended time. Level 3 sentence unscramble complete with mod A after initial modeling. [name] reports successful communication over holiday family visits and success talking at work.  .      Assessment / Recommendations / Plan   Plan Continue with current plan of care      Progression Toward Goals   Progression toward goals Progressing toward goals              SLP Education - 11/30/21 1455     Education Details talk path, other apps he can use    Person(s) Educated Patient    Methods Explanation;Demonstration;Verbal cues;Handout    Comprehension Verbalized understanding;Returned demonstration;Verbal cues required              SLP Short Term Goals - 11/30/21 1456       SLP SHORT TERM GOAL #1   Title pt will tell SLP three compensatory strategies for anomia/apraxia    Status Not Met    Target  Date 10/28/21   date extended due to holiday week     SLP SHORT TERM GOAL #2   Title pt family will demo appropriate cueing technique with rare min A from SLP    Baseline 10/26/21    Status Achieved    Target Date 10/28/21      SLP SHORT TERM GOAL #3   Title pt will initiate developement of device to assist his verbal communication    Status Achieved    Target Date 10/28/21      SLP SHORT TERM GOAL #4   Title pt will complete linguistic homework 80% of the time requested    Status Achieved    Target Date 10/28/21              SLP Long Term Goals - 11/30/21 1457       SLP LONG TERM GOAL #1   Title pt will reportedly use/use two different compensatory strategies for anomia/apraxia successfully in 10 minutes simple  conversation, over 3 sessions    Baseline 11/16/21, 11-25-21    Time 2    Period Weeks    Status On-going      SLP LONG TERM GOAL #2   Title pt will improve his score in outcome measure completed in first 2 therapy session (decr level of overall frustration about verbal communication)    Status Achieved      SLP LONG TERM GOAL #3   Title pt family will demo appropriate cueing techniques to assist pt verbal expression in 3 sessions    Baseline 11/16/21 - per pt report    Time 2    Period Weeks    Status On-going      SLP LONG TERM GOAL #4   Title pt will incr MLU in 2 minutes simple conversation to 6.5    Baseline 11/16/21    Time 7    Period Weeks    Status Achieved      SLP LONG TERM GOAL #5   Title pt will indicate he talks more at work during end of treatment course than prior to [place] 11/09/21, 11/16/21    Status Achieved              Plan - 11/30/21 1456     Clinical Impression Statement 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 Pt is carrying over both verbal and low tech AAC strategies to improve ease of communication. He has reduced avoidance of talking at work and over the phone and reports 75% reduction in frustration communicating. [name] is motivated and completes [employer] and HEP consisitently.  Continue skilled ST to preserve remaining linguistic ability, train pt and caregiver in compensations for aphasia, and plan for progression of aphasia    Duration 12 weeks    Treatment/Interventions Functional tasks;Compensatory techniques;Multimodal communcation approach;Cueing hierarchy;Language facilitation;SLP instruction and feedback;Internal/external aids;Patient/family education    Potential to Achieve Goals Good             Patient will benefit from skilled therapeutic intervention in order to improve the following deficits and impairments:   Aphasia  Verbal apraxia    Problem List Patient Active Problem List   Diagnosis Date  Noted   Mild neurocognitive disorder, 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

## 2021-11-30 NOTE — Patient Instructions (Signed)
° °  If you notice more frustration with communication and your cheat sheets aren't working, call your doctor and get an order for [place] are good  [employer] Apps

## 2021-12-07 ENCOUNTER — Encounter: Payer: Self-pay | Admitting: Speech Pathology

## 2021-12-07 ENCOUNTER — Other Ambulatory Visit: Payer: Self-pay

## 2021-12-07 ENCOUNTER — Ambulatory Visit: Payer: Medicare HMO | Admitting: Speech Pathology

## 2021-12-07 DIAGNOSIS — R4701 Aphasia: Secondary | ICD-10-CM | POA: Diagnosis not present

## 2021-12-07 DIAGNOSIS — R482 Apraxia: Secondary | ICD-10-CM | POA: Diagnosis not present

## 2021-12-07 NOTE — Therapy (Signed)
[facility] [address] [place], [place], [address] Phone: [phone]   Fax:  [phone]  Speech Language Pathology Treatment & Discharge Summary  Patient Details  Name: Robert Mann MRN: [id number] Date of Birth: 07-01-1951 Referring Provider (SLP): Coralee Pesa. 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   Subjective Assessment - 12/07/21 1315     Subjective "My wife when to the ICU since I was here last, I took her home Sunday"    Currently in Pain? No/denies                   ADULT SLP TREATMENT - 12/07/21 1317       General Information   Behavior/Cognition Alert;Cooperative;Pleasant mood      Treatment Provided   Treatment provided Cognitive-Linquistic      Cognitive-Linquistic Treatment   Treatment focused on Aphasia;Apraxia;Patient/family/caregiver education    Skilled Treatment Pt had been at the hospital twice a day due to wife's illness. He  reports success communicating with hospital staff. Instructed Robert Mann to write down what you need to say before the call. He used compensations to expalin his wife's course of hospitalization with no requests for clarifiation. Robert Mann used descriptions and gestures with mod I to augment communication in conversation.  In structured description task generating 3 descriptions. Out of 10 items, or 30 descriptions, he required questioning cues 3x. Encourage Robert Mann to say aloud all of his activities on Cue Speak and Talk Path, even if the activity doesn't required speaking      Assessment / Recommendations / Plan   Plan Discharge SLP treatment due to (comment)      Progression Toward Goals   Progression toward goals Goals met, education completed, patient discharged from SLP              SLP Education - 12/07/21 1400     Education Details use cheat sheet as needed, generate new cheat sheet if needed, keep using gestures and descriptions as needed, self advocate    Person(s) Educated Patient    Methods Explanation;Demonstration;Verbal cues;Handout    Comprehension Verbalized understanding;Returned demonstration            SPEECH THERAPY DISCHARGE SUMMARY  Visits from Start of Care: 14  Current functional level related to goals / functional outcomes: See  goals below °  °Remaining deficits: °Primary Progressive Aphasia with verbal apraxia °  °Education / Equipment: °Speech/language activities to do at home, verbal and non verbal compensations for aphasia, environmental modifications to support communicaation  ° °Patient agrees to discharge. Patient goals were met. Patient is being discharged due to meeting the stated rehab goals.. ° ° ° ° ° ° ° SLP Short Term Goals - 12/07/21 1403   ° °  ° SLP SHORT TERM GOAL #1  ° Title pt will tell SLP three compensatory strategies for anomia/apraxia   ° Status Not Met   ° Target Date 10/28/21   date extended due to holiday week  °  ° SLP SHORT TERM GOAL #2  °  Title pt family will demo appropriate cueing technique with rare min A from SLP   ° Baseline 10/26/21   ° Status Achieved   ° Target Date 10/28/21   °  ° SLP SHORT TERM GOAL #3  ° Title pt will initiate developement of device to assist his verbal communication   ° Status Achieved   ° Target Date 10/28/21   °  ° SLP SHORT TERM GOAL #4  ° Title pt will complete linguistic homework 80% of the time requested   ° Status Achieved   ° Target Date 10/28/21   ° °  °  ° °  ° ° ° SLP Long Term Goals - 11/30/21 1457   ° °  ° SLP LONG TERM GOAL #1  ° Title pt will reportedly use/use two different compensatory strategies for anomia/apraxia successfully in 10 minutes simple conversation, over 3 sessions   ° Baseline 11/16/21, 11-25-21 , 12/07/21  ° Time 2   ° Period Weeks   ° Status Achieved  °  ° SLP LONG TERM GOAL #2  ° Title pt will improve his score in outcome measure completed in first 2 therapy session (decr level of overall frustration about verbal communication)   ° Status Achieved   °  ° SLP LONG TERM GOAL #3  ° Title pt family will demo appropriate cueing techniques to assist pt verbal expression in 3 sessions   ° Baseline 11/16/21 - per pt report   ° Time 2   ° Period Weeks   ° Status Deferred, spouse not attend due to illness  °  ° SLP LONG TERM GOAL #4  ° Title pt will incr MLU in 2 minutes simple conversation to 6.5   ° Baseline 11/16/21   ° Time 7   ° Period Weeks   ° Status Achieved   °  ° SLP LONG TERM GOAL #5  ° Title pt will indicate he talks more at work during end of treatment course than prior to ST   ° Baseline 11/09/21, 11/16/21   ° Status Achieved   ° °  °  ° °  ° ° ° Plan - 12/07/21 1401   ° ° Clinical Impression Statement Robert Mann is using compensations for aphasia in simple to moderately complex conversations with success. He is using his AAC "cheat sheet" as needed. Robert Mann is aware of environmental compensations to improve his communication. Goals met, d/c ST. Robert Mann is in agreement and knows he can request  a new order to return to ST should communication decline.   ° Speech Therapy Frequency 1x /week   ° Duration 12 weeks   ° Treatment/Interventions Functional tasks;Compensatory techniques;Multimodal communcation approach;Cueing hierarchy;Language facilitation;SLP instruction and feedback;Internal/external aids;Patient/family education   ° Potential to Achieve Goals Good   °   Potential Considerations Medical prognosis   ° °  °  ° °  ° ° °Patient will benefit from skilled therapeutic intervention in order to improve the following deficits and impairments:   °Aphasia ° °Verbal apraxia ° ° ° °Problem List °Patient Active Problem List  ° Diagnosis Date Noted  ° Mild neurocognitive disorder, 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  ° 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

## 2021-12-07 NOTE — Patient Instructions (Addendum)
° °  If you need to make a call about your wife's care, write down your questions or what you want to say before the call  Let the listener know that you need extra time to communicate  With Cue Speak, make sure you are saying the words and reading aloud, even if the activity doesn't call for it  When you are fatigued or stressed (like having your wife in the ICU) your speech and language may get worse until you get back on your schedule  You are doing a great job communicating by gestures, describing and giving cues - keep it up  Use your cheat sheet when you need it - or make another one if you need it  Stick up for yourself - have people turn down the noise

## 2022-01-03 ENCOUNTER — Other Ambulatory Visit: Payer: Self-pay | Admitting: Adult Health

## 2022-01-19 ENCOUNTER — Ambulatory Visit (INDEPENDENT_AMBULATORY_CARE_PROVIDER_SITE_OTHER): Payer: Medicare HMO

## 2022-01-19 VITALS — Ht 71.0 in | Wt 168.0 lb

## 2022-01-19 DIAGNOSIS — Z0000 Encounter for general adult medical examination without abnormal findings: Secondary | ICD-10-CM

## 2022-01-19 NOTE — Progress Notes (Signed)
Subjective:   Robert Mann is a 71 y.o. male who presents for Medicare Annual/Subsequent preventive examination.  Review of Systems    Virtual Visit via Telephone Note  I connected with  Robert Mann on 01/19/22 at  3:15 PM EST by telephone and verified that I am speaking with the correct person using two identifiers.  Location: Patient: Home Provider: Office Persons participating in the virtual visit: patient/Nurse Health Advisor   I discussed the limitations, risks, security and privacy concerns of performing an evaluation and management service by telephone and the availability of in person appointments. The patient expressed understanding and agreed to proceed.  Interactive audio and video telecommunications were attempted between this nurse and patient, however failed, due to patient having technical difficulties OR patient did not have access to video capability.  We continued and completed visit with audio only.  Some vital signs may be absent or patient reported.   Robert Peaches, LPN  Cardiac Risk Factors include: advanced age (>49men, >55 women);hypertension;male gender     Objective:    Today's Vitals   01/19/22 1518  Weight: 168 lb (76.2 kg)  Height: 5\' 11"  (1.803 m)   Body mass index is 23.43 kg/m.  Advanced Directives 01/19/2022 09/23/2021 09/15/2021 12/31/2019 03/27/2018 02/08/2018  Does Patient Have a Medical Advance Directive? Yes Yes Yes Yes Yes Yes  Type of Paramedic of [place];Living will - - [place];Living will [place];Living will [place];Living will  Does patient want to make changes to medical advance directive? No - Patient declined - - No - Patient declined - No - Patient declined  Copy of [place] in Chart? No - copy requested - - No - copy requested No - copy requested No - copy requested    Current Medications  (verified) Outpatient Encounter Medications as of 01/19/2022  Medication Sig   amLODipine (NORVASC) 5 MG tablet Take 1 tablet (5 mg total) by mouth daily.   Blood Pressure Monitoring (BLOOD PRESSURE MONITOR/M CUFF) MISC One cuff for management of hypertension   Cholecalciferol (VITAMIN D3) 20 MCG TABS Take 25 mcg by mouth daily.   Coenzyme Q10 (CO Q 10 PO) Take 300 mg by mouth daily.    donepezil (ARICEPT) 10 MG tablet Take half tablet (5 mg) daily for 2 weeks, then increase to the full tablet at 10 mg daily   fexofenadine (ALLEGRA ODT) 30 MG disintegrating tablet Take 30 mg by mouth daily.   Glucosamine-Chondroit-Vit C-Mn (GLUCOSAMINE CHONDR 1500 COMPLX PO) Take 1 tablet by mouth daily.   losartan (COZAAR) 25 MG tablet TAKE 1 TABLET (25 MG TOTAL) BY MOUTH DAILY.   Multiple Vitamins-Minerals (CENTRUM SILVER PO) Take 1 tablet by mouth daily. (Patient not taking: Reported on 09/23/2021)   naproxen sodium (ALEVE) 220 MG tablet Take 220 mg by mouth as needed.   Omega-3 Fatty Acids (FISH OIL PO) Take 1 capsule by mouth daily.   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  Past Surgical History:  Procedure Laterality Date   MELANOMA EXCISION WITH SENTINEL LYMPH NODE BIOPSY Left 02/08/2018   Procedure: WIDE EXCISION OF LEFT CHEST MELANOMA AND LEFT AXILLARY SENTINEL LYMPH NODE;  Surgeon: Georganna Skeans, MD;  Location: [facility];  Service: General;  Laterality: Left;   WISDOM TOOTH EXTRACTION     Family History  Problem Relation Age of Onset   Hypertension Mother     Stroke Mother    Hyperlipidemia Mother    Heart disease Mother    Hypertension Father    Heart attack Father    Heart disease Father    Hyperlipidemia Father    Cancer Brother 44       Glioblastoma    Social History   Socioeconomic History   Marital status: Married    Spouse name: Not on file   Number of children: 2   Years of education: 14   Highest education level: Some college, no degree  Occupational History   Occupation: part time   Tobacco Use   Smoking status: Former    Types: Cigarettes    Quit date: 08/08/1980    Years since quitting: 41.4   Smokeless tobacco: Never   Tobacco comments:    Quit smoking cigarettes in 1981  Vaping Use   Vaping Use: Never used  Substance and Sexual Activity   Alcohol use: Yes    Comment: social    Drug use: No   Sexual activity: Not on file  Other Topics Concern   Not on file  Social History Narrative   Working PT; job requires a tremendous amount of walking   Married    3 children   Social Determinants of Radio broadcast assistant Strain: Low Risk    Difficulty of Paying Living Expenses: Not hard at all  Food Insecurity: No Food Insecurity   Worried About Charity fundraiser in the Last Year: Never true   Arboriculturist in the Last Year: Never true  Transportation Needs: No Transportation Needs   Lack of Transportation (Medical): No   Lack of Transportation (Non-Medical): No  Physical Activity: Sufficiently Active   Days of Exercise per Week: 7 days   Minutes of Exercise per Session: 150+ min  Stress: No Stress Concern Present   Feeling of Stress : Not at all  Social Connections: Moderately Isolated   Frequency of Communication with Friends and Family: More than three times a week   Frequency of Social Gatherings with Friends and Family: More than three times a week   Attends Religious Services: Never   Marine scientist or Organizations: No   Attends Music therapist: Never   Marital Status:  Married   Clinical Intake:  Pre-visit preparation completed: Yes How often do you need to have someone help you when you read instructions, pamphlets, or other written materials from your doctor or pharmacy?: 1 - Never  Diabetic?  No  Interpreter Needed?: No  Information entered by :: Rolene Arbour LPN   Activities of Daily Living In your present state of health, do you have any difficulty performing the following activities: 01/19/2022 01/12/2022  Hearing? N N  Vision? N N  Difficulty concentrating or making decisions? N N  Walking or climbing stairs? N N  Dressing or bathing? N N  Doing errands, shopping? N N  Preparing Food and eating ? N N  Using the Toilet? N N  In the past six months, have you accidently leaked urine? -  N  Do you have problems with loss of bowel control? N N  Managing your Medications? N N  Managing your Finances? N N  Housekeeping or managing your Housekeeping? N N  Some recent data might be hidden    Patient Care Team: Dorothyann Peng, NP as PCP - General (Family Medicine)  Indicate any recent Medical Services you may have received from other than [facility] providers in the past year (date may be approximate).     Assessment:   This is a routine wellness examination for Gideon.  Hearing/Vision screen Hearing Screening - Comments:: No difficulty hearing Vision Screening - Comments:: Wears contacts. Followed by Dr [name]  Dietary issues and exercise activities discussed: Exercise limited by: None identified   Goals Addressed               This Visit's Progress     DIET - INCREASE WATER INTAKE (pt-stated)        Goal 6-8  8 oz bottles  of water per day       Depression Screen PHQ 2/9 Scores 01/19/2022 04/29/2021 12/31/2019  PHQ - 2 Score 0 0 0    Fall Risk Fall Risk  01/19/2022 01/12/2022 09/15/2021 04/29/2021 12/31/2019  Falls in the past year? 0 0 0 0 0  Number falls in past yr: 0 0 0 0 -  Injury with Fall? 0 0 0 0 -  Risk for fall due to :  No Fall Risks - - - -  Follow up - - - - Falls evaluation completed;Education provided;Falls prevention discussed    FALL RISK PREVENTION PERTAINING TO THE HOME:  Any stairs in or around the home? No  If so, are there any without handrails? No  Home free of loose throw rugs in walkways, pet beds, electrical cords, etc? Yes  Adequate lighting in your home to reduce risk of falls? Yes   ASSISTIVE DEVICES UTILIZED TO PREVENT FALLS:  Life alert? No  Use of a cane, walker or w/c? No Grab bars in the bathroom? Yes  Shower chair or bench in shower? Yes  Elevated toilet seat or a handicapped toilet? Yes   TIMED UP AND GO:  Was the test performed? No . Audio Visit  Cognitive Function:     6CIT Screen 01/19/2022 12/31/2019  What Year? 0 points 0 points  What month? 0 points 0 points  What time? 0 points 0 points  Count back from 20 0 points 0 points  Months in reverse 0 points 0 points  Repeat phrase 2 points 0 points  Total Score 2 0    Immunizations Immunization History  Administered Date(s) Administered   Fluad Quad(high Dose 65+) 08/15/2019, 09/08/2021   Influenza, High Dose Seasonal PF 08/14/2018   Pneumococcal Conjugate-13 07/31/2018   Pneumococcal Polysaccharide-23 08/15/2019   Tdap 07/29/2015   Zoster, Live 03/27/2016    TDAP status: Up to date  Flu Vaccine status: Up to date  Pneumococcal vaccine status: Up to date  Covid-19 vaccine status: Completed vaccines  Qualifies for Shingles Vaccine? Yes   Zostavax completed Yes   Shingrix Completed?: Yes  Screening Tests Health Maintenance  Topic Date Due   COVID-19 Vaccine (1) 02/04/2022 (Originally 01/27/1952)   Zoster Vaccines- Shingrix (1 of 2) 04/18/2022 (Originally 07/31/1970)   TETANUS/TDAP  07/28/2025   COLONOSCOPY (Pts 45-42yrs Insurance coverage will need to be confirmed)  09/04/2028   Pneumonia Vaccine 43+ Years old  Completed   INFLUENZA VACCINE  Completed   Hepatitis C Screening  Completed   HPV  VACCINES  Aged Out    Health Maintenance  There are no preventive care reminders to display for this patient.   Colorectal cancer screening: Type of screening: Colonoscopy. Completed 09/04/18. Repeat every 10 years  Lung Cancer Screening: (Low Dose CT Chest recommended if Age 53-80 years, 30 pack-year currently smoking OR have quit w/in 15years.) does not qualify.    Additional Screening:  Hepatitis C Screening: does qualify; Completed 12/04/18  Vision Screening: Recommended annual ophthalmology exams for early detection of glaucoma and other disorders of the eye. Is the patient up to date with their annual eye exam?  Yes  Who is the provider or what is the name of the office in which the patient attends annual eye exams? Dr [name] If pt is not established with a provider, would they like to be referred to a provider to establish care? No .   Dental Screening: Recommended annual dental exams for proper oral hygiene  Community Resource Referral / Chronic Care Management:  CRR required this visit?  No   CCM required this visit?  No      Plan:     I have personally reviewed and noted the following in the patients chart:   Medical and social history Use of alcohol, tobacco or illicit drugs  Current medications and supplements including opioid prescriptions. Patient is currently taking opioid prescriptions. Information provided to patient regarding non-opioid alternatives. Patient advised to discuss non-opioid treatment plan with their provider. Functional ability and status Nutritional status Physical activity Advanced directives List of other physicians Hospitalizations, surgeries, and ER visits in previous 12 months Vitals Screenings to include cognitive, depression, and falls Referrals and appointments  In addition, I have reviewed and discussed with patient certain preventive protocols, quality metrics, and best practice recommendations. A written personalized care plan  for preventive services as well as general preventive health recommendations were provided to patient.     Robert Peaches, LPN   7/51/7001   Nurse Notes: None

## 2022-01-19 NOTE — Patient Instructions (Addendum)
[name] , Thank you for taking time to come for your Medicare Wellness Visit. I appreciate your ongoing commitment to your health goals. Please review the following plan we discussed and let me know if I can assist you in the future.   These are the goals we discussed:  Goals       DIET - INCREASE WATER INTAKE (pt-stated)      Goal 6-8  8 oz bottles  of water per day        This is a list of the screening recommended for you and due dates:  Health Maintenance  Topic Date Due   COVID-19 Vaccine (1) 02/04/2022*   Zoster (Shingles) Vaccine (1 of 2) 04/18/2022*   Tetanus Vaccine  07/28/2025   Colon Cancer Screening  09/04/2028   Pneumonia Vaccine  Completed   Flu Shot  Completed   Hepatitis C Screening: USPSTF Recommendation to screen - Ages 18-79 yo.  Completed   HPV Vaccine  Aged Out  *Topic was postponed. The date shown is not the original due date.    Advanced directives: Yes Patient will bring copy  Conditions/risks identified: None  Next appointment: Follow up in one year for your annual wellness visit.   Preventive Care 17 Years and Older, Male Preventive care refers to lifestyle choices and visits with your health care provider that can promote health and wellness. What does preventive care include? A yearly physical exam. This is also called an annual well check. Dental exams once or twice a year. Routine eye exams. Ask your health care provider how often you should have your eyes checked. Personal lifestyle choices, including: Daily care of your teeth and gums. Regular physical activity. Eating a healthy diet. Avoiding tobacco and drug use. Limiting alcohol use. Practicing safe sex. Taking low doses of aspirin every day. Taking vitamin and mineral supplements as recommended by your health care provider. What happens during an annual well check? The services and screenings done by your health care provider during your annual well check will depend on your  age, overall health, lifestyle risk factors, and family history of disease. Counseling  Your health care provider may ask you questions about your: Alcohol use. Tobacco use. Drug use. Emotional well-being. Home and relationship well-being. Sexual activity. Eating habits. History of falls. Memory and ability to understand (cognition). Work and work Statistician. Screening  You may have the following tests or measurements: Height, weight, and BMI. Blood pressure. Lipid and cholesterol levels. These may be checked every 5 years, or more frequently if you are over 42 years old. Skin check. Lung cancer screening. You may have this screening every year starting at age 65 if you have a 30-pack-year history of smoking and currently smoke or have quit within the past 15 years. Fecal occult blood test (FOBT) of the stool. You may have this test every year starting at age 100. Flexible sigmoidoscopy or colonoscopy. You may have a sigmoidoscopy every 5 years or a colonoscopy every 10 years starting at age 68. Prostate cancer screening. Recommendations will vary depending on your family history and other risks. Hepatitis C blood test. Hepatitis B blood test. Sexually transmitted disease (STD) testing. Diabetes screening. This is done by checking your blood sugar (glucose) after you have not eaten for a while (fasting). You may have this done every 1-3 years. Abdominal aortic aneurysm (AAA) screening. You may need this if you are a current or former smoker. Osteoporosis. You may be screened starting at age 66 if  you are at high risk. Talk with your health care provider about your test results, treatment options, and if necessary, the need for more tests. Vaccines  Your health care provider may recommend certain vaccines, such as: Influenza vaccine. This is recommended every year. Tetanus, diphtheria, and acellular pertussis (Tdap, Td) vaccine. You may need a Td booster every 10 years. Zoster  vaccine. You may need this after age 65. Pneumococcal 13-valent conjugate (PCV13) vaccine. One dose is recommended after age 42. Pneumococcal polysaccharide (PPSV23) vaccine. One dose is recommended after age 73. Talk to your health care provider about which screenings and vaccines you need and how often you need them. This information is not intended to replace advice given to you by your health care provider. Make sure you discuss any questions you have with your health care provider. Document Released: 12/10/2015 Document Revised: 08/02/2016 Document Reviewed: 09/14/2015 Elsevier Interactive Patient Education  2017 [employer] Prevention in the Home Falls can cause injuries. They can happen to people of all ages. There are many things you can do to make your home safe and to help prevent falls. What can I do on the outside of my home? Regularly fix the edges of walkways and driveways and fix any cracks. Remove anything that might make you trip as you walk through a door, such as a raised step or threshold. Trim any bushes or trees on the path to your home. Use bright outdoor lighting. Clear any walking paths of anything that might make someone trip, such as rocks or tools. Regularly check to see if handrails are loose or broken. Make sure that both sides of any steps have handrails. Any raised decks and porches should have guardrails on the edges. Have any leaves, snow, or ice cleared regularly. Use sand or salt on walking paths during winter. Clean up any spills in your garage right away. This includes oil or grease spills. What can I do in the bathroom? Use night lights. Install grab bars by the toilet and in the tub and shower. Do not use towel bars as grab bars. Use non-skid mats or decals in the tub or shower. If you need to sit down in the shower, use a plastic, non-slip stool. Keep the floor dry. Clean up any water that spills on the floor as soon as it happens. Remove  soap buildup in the tub or shower regularly. Attach bath mats securely with double-sided non-slip rug tape. Do not have throw rugs and other things on the floor that can make you trip. What can I do in the bedroom? Use night lights. Make sure that you have a light by your bed that is easy to reach. Do not use any sheets or blankets that are too big for your bed. They should not hang down onto the floor. Have a firm chair that has side arms. You can use this for support while you get dressed. Do not have throw rugs and other things on the floor that can make you trip. What can I do in the kitchen? Clean up any spills right away. Avoid walking on wet floors. Keep items that you use a lot in easy-to-reach places. If you need to reach something above you, use a strong step stool that has a grab bar. Keep electrical cords out of the way. Do not use floor polish or wax that makes floors slippery. If you must use wax, use non-skid floor wax. Do not have throw rugs and other things on  the floor that can make you trip. What can I do with my stairs? Do not leave any items on the stairs. Make sure that there are handrails on both sides of the stairs and use them. Fix handrails that are broken or loose. Make sure that handrails are as long as the stairways. Check any carpeting to make sure that it is firmly attached to the stairs. Fix any carpet that is loose or worn. Avoid having throw rugs at the top or bottom of the stairs. If you do have throw rugs, attach them to the floor with carpet tape. Make sure that you have a light switch at the top of the stairs and the bottom of the stairs. If you do not have them, ask someone to add them for you. What else can I do to help prevent falls? Wear shoes that: Do not have high heels. Have rubber bottoms. Are comfortable and fit you well. Are closed at the toe. Do not wear sandals. If you use a stepladder: Make sure that it is fully opened. Do not climb a  closed stepladder. Make sure that both sides of the stepladder are locked into place. Ask someone to hold it for you, if possible. Clearly mark and make sure that you can see: Any grab bars or handrails. First and last steps. Where the edge of each step is. Use tools that help you move around (mobility aids) if they are needed. These include: Canes. Walkers. Scooters. Crutches. Turn on the lights when you go into a dark area. Replace any light bulbs as soon as they burn out. Set up your furniture so you have a clear path. Avoid moving your furniture around. If any of your floors are uneven, fix them. If there are any pets around you, be aware of where they are. Review your medicines with your doctor. Some medicines can make you feel dizzy. This can increase your chance of falling. Ask your doctor what other things that you can do to help prevent falls. This information is not intended to replace advice given to you by your health care provider. Make sure you discuss any questions you have with your health care provider. Document Released: 09/09/2009 Document Revised: 04/20/2016 Document Reviewed: 12/18/2014 Elsevier Interactive Patient Education  2017 [employer].

## 2022-03-16 ENCOUNTER — Ambulatory Visit: Payer: Medicare HMO | Admitting: Physician Assistant

## 2022-03-30 ENCOUNTER — Ambulatory Visit: Payer: Medicare HMO | Admitting: Physician Assistant

## 2022-03-30 ENCOUNTER — Encounter: Payer: Self-pay | Admitting: Physician Assistant

## 2022-03-30 VITALS — BP 154/97 | HR 79 | Resp 18 | Ht 71.0 in | Wt 171.0 lb

## 2022-03-30 DIAGNOSIS — G3101 Pick's disease: Secondary | ICD-10-CM | POA: Diagnosis not present

## 2022-03-30 DIAGNOSIS — R69 Illness, unspecified: Secondary | ICD-10-CM | POA: Diagnosis not present

## 2022-03-30 DIAGNOSIS — F0280 Dementia in other diseases classified elsewhere without behavioral disturbance: Secondary | ICD-10-CM

## 2022-03-30 DIAGNOSIS — G3184 Mild cognitive impairment, so stated: Secondary | ICD-10-CM | POA: Diagnosis not present

## 2022-03-30 MED ORDER — DONEPEZIL HCL 10 MG PO TABS: ORAL_TABLET | ORAL | 11 refills | Status: DC

## 2022-03-30 NOTE — Progress Notes (Signed)
? ? ?Assessment/Plan:  ? ?Robert Mann is a 71 y.o. 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 ?Patient is on donepezil 10 mg daily, tolerating well.  He has completed 14 sessions of speech therapy, with good results, has learned new ways of "working around the speech problem".  In today's visit, the patient is doing well, and he is stable from the cognitive standpoint. ? ? Recommendations:  ? 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 ?Discussed safety both in and out of the home.  ?Discussed the importance of regular daily schedule with inclusion of crossword puzzles to maintain brain function.  ?Continue to monitor mood with PCP.  ?Stay active at least 30 minutes at least 3 times a week.  ?Naps should be scheduled and should be no longer than 60 minutes and should not occur after 2 PM.  ?Mediterranean diet is recommended  ?Continue Donepezil 10 mg daily.  Side effects discussed ? ? ? ?Subjective:  ? ?How is the patient's memory?  The patient reports that his memory is about the same.  He denies any new issues with short-term memory, his main complaint is regarding PPA with verbal apraxia, as he reports that "he knows what he wants to say, but the words do not come out ".  He has completed 14 sessions of speech therapy, at has helped him work around his vocabulary, he has been writing down what he needs to say before making a phone call, and also he is using gestures to increase his communication in conversation.  Because of this, he is now able to socialize more, he feels more confident despite his difficulties with  speech. He does enjoy reading.  ?Patient lives with: Spouse ?repeats oneself? Patient denies   ?Disoriented when walking into a room? Patient denies  ?Leaving objects in unusual places?Patient denies  ?Ambulates  with difficulty? Patient denies  ?Recent falls?  Patient denies ?Any head injuries?  Patient denies ?History of seizures?  Denies ?Wandering behavior?  Patient denies ?Patient drives?  Drives without any issues , recently drove Pa to home without any difficulties.  ?Any mood changes such irritability agitation?  Patient denies, he tries to remain positive  ?any history of depression?:  "Never, I look forward to the next day.  " ?Hallucinations?  Patient denies ?Paranoia?  Patient denies ?Patient reports that he sleeps well without vivid dreams, REM behavior or sleepwalking."Dreams are less and less" ?History of sleep apnea?  Patient denies ?Any hygiene concerns?  Patient denies ?Independent of bathing and dressing?  Patient denies ?Does the patient needs help with medications?  Patient denies.  He does not miss any medications  ?who is in charge of the finances?  Patient is in charge and denies missing any payments ?Any changes in appetite?  Patient denies "I am eating too well". ?Patient have trouble swallowing?  Patient denies. ?Does the patient cook?  His wife does the cooking ?Any headaches?  Patient denies ? double vision?  Patient denies ?Any focal numbness or tingling?  Patient denies ?Chronic back pain" he has issues with sciatica, which at times causes him to have difficulties with ambulation but he is trying to remain active, and walking  at least 10,000 steps a day ?Unilateral weakness?  Patient denies ?Any tremors?  Reports minimal left hand tremors without any other parkinsonian signs, "they do not bother me ". ?Any history of anosmia?  Patient denies ?Any incontinence of urine?  Patient denies ?Any bowel dysfunction?  Patient denies ? ? ?Labs 09/08/21 ?CBC, CMP normal ?TC 227, TG 153, LDL 152   ?TSH 1.79 ?B12 >2000 04/2021 ? ?MRI brain wo contrast 08/18/21 ?No evidence of acute intracranial abnormality. Prominent perivascular space versus chronic lacunar infarct within the central pons. Moderate generalized cerebral atrophy with comparatively mild cerebellar atrophy. Otherwise unremarkable MRI appearance of the brain for age. Small left maxillary sinus mucous retention cyst. ?  ?  ?Neuropsychological evaluation on 09/02/2021 Dr. [name] "Briefly, results suggested diffuse cognitive impairment relative to age-matched peers. He did exhibit appropriate performances across visuospatial abilities and confrontation naming. However, all other assessed domains exhibited severe cognitive impairment. 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 This neurodegenerative disease process is characterized by pronounced impairment with language and executive dysfunction when in earlier stages. While Mr. Reth showed relatively diffuse impairment, these areas arguably exhibited greatest impairment. In particular, the non-fluent PPA subtype is characterized by the evolution of significant word finding difficulties and halting, effortful speech. These symptoms were very prevalent during the current evaluation, as well as reported by Mr. Gift and his wife as being present and worsening over the past 1-2 years. Specific to Alzheimer's disease, Mr. Marlatt did show significant memory impairment across testing. However, it is worth highlighting that he exhibited appropriate retention scores across 3/4 memory tasks. This would suggest that greater memory difficulty is across encoding (i.e., learning) and retrieval aspects of memory, likely worsened by profound deficits in receptive and expressive language. Strong retention scores would not suggest a primary memory storage deficit, which would be inconsistent with typically presenting Alzheimer's  disease"  When meeting with his neurologist, I would recommend that they discuss neurological medications which may slow decline. It is important to highlight that these medications may slow functional decline in some individuals. Unfortunately, there is no current treatment which is able to stop or reverse these changes. To aid with further diagnostic clarity, they could also discuss the pros and cons of an FDG-PET scan as this can yield helpful information in cases such as this.  ? ? ? Initial Visit  09/15/21 The patient is seen in neurologic consultation at the request of [name], Tommi Rumps, NP for the evaluation of memory loss in the setting of Primary Progressive Aphasia.  The patient is here alone.  This is a 71 year old right-handed male who had memory issues for about 2 years.  He describes it as "I have the words in my head, but can get them out ".  This has been worse over the last 6 months.  He lives with his wife who has also noticed the same changes.  His mood is good, but he is also very cautious about talking to people, which has made him more withdrawn, and reviewed that he is afraid that people will notice.  He denies depression or irritability.  He likes to read, but he has to sometimes read the same article several times until "getting it ".  During the visit, he has shown some frustration in trying to find the right word.  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  He noted yes the porch.   ?He sleeps well, denies vivid dreams or sleepwalking, hallucinations or paranoia.  He denies leaving objects in unusual places, but cannot remember at times where he left him.  He is independent of bathing and dressing, no issues with hygiene.  He does not forget to take his medications, he is in charge of his finances, denies missing any bills.  His appetite is good, denies any trouble swallowing,  or sialorrhea.  He has a history of reflux, that at times causes him to have some throat discomfort.  His wife does the cooking.  He ambulates about 10 miles a day without difficulties, denies any falls.  Over t

## 2022-03-30 NOTE — Patient Instructions (Signed)
It was a pleasure to see you today at our office.  ? ?Recommendations: ? ?Meds: ?Follow up in 6  months ?Continue donepezil 10 mg daily. Side effects were discussed  ?  ? ? ?RECOMMENDATIONS FOR ALL PATIENTS WITH MEMORY PROBLEMS: ?1. Continue to exercise (Recommend 30 minutes of walking everyday, or 3 hours every week) ?2. Increase social interactions - continue going to [place] and enjoy social gatherings with friends and family ?3. Eat healthy, avoid fried foods and eat more fruits and vegetables ?4. Maintain adequate blood pressure, blood sugar, and blood cholesterol level. Reducing the risk of stroke and cardiovascular disease also helps promoting better memory. ?5. Avoid stressful situations. Live a simple life and avoid aggravations. Organize your time and prepare for the next day in anticipation. ?6. Sleep well, avoid any interruptions of sleep and avoid any distractions in the bedroom that may interfere with adequate sleep quality ?7. Avoid sugar, avoid sweets as there is a strong link between excessive sugar intake, diabetes, and cognitive impairment ?We discussed the Mediterranean diet, which has been shown to help patients reduce the risk of progressive memory disorders and reduces cardiovascular risk. This includes eating fish, eat fruits and green leafy vegetables, nuts like almonds and hazelnuts, walnuts, and also use olive oil. Avoid fast foods and fried foods as much as possible. Avoid sweets and sugar as sugar use has been linked to worsening of memory function. ? ?There is always a concern of gradual progression of memory problems. If this is the case, then we may need to adjust level of care according to patient needs. Support, both to the patient and caregiver, should then be put into place.  ? ? ? ? ? ?FALL PRECAUTIONS: Be cautious when walking. Scan the area for obstacles that may increase the risk of trips and falls. When getting up in the mornings, sit up at the edge of the bed for a few  minutes before getting out of bed. Consider elevating the bed at the head end to avoid drop of blood pressure when getting up. Walk always in a well-lit room (use night lights in the walls). Avoid area rugs or power cords from appliances in the middle of the walkways. Use a walker or a cane if necessary and consider physical therapy for balance exercise. Get your eyesight checked regularly. ? ?FINANCIAL OVERSIGHT: Supervision, especially oversight when making financial decisions or transactions is also recommended. ? ?HOME SAFETY: Consider the safety of the kitchen when operating appliances like stoves, microwave oven, and blender. Consider having supervision and share cooking responsibilities until no longer able to participate in those. Accidents with firearms and other hazards in the house should be identified and addressed as well. ? ? ?ABILITY TO BE LEFT ALONE: If patient is unable to contact 911 operator, consider using [employer], or when the need is there, arrange for someone to stay with patients. Smoking is a fire hazard, consider supervision or cessation. Risk of wandering should be assessed by caregiver and if detected at any point, supervision and safe proof recommendations should be instituted. ? ?MEDICATION SUPERVISION: Inability to self-administer medication needs to be constantly addressed. Implement a mechanism to ensure safe administration of the medications. ? ? ?DRIVING: Regarding driving, in patients with progressive memory problems, driving will be impaired. We advise to have someone else do the driving if trouble finding directions or if minor accidents are reported. Independent driving assessment is available to determine safety of driving. ? ? ?If you are interested in the driving  assessment, you can contact the following: ? ?[facility] [employer] [facility] in [place] ? ?[place] [phone] ? ?[facility] [phone] ? ?[facility] [phone]  or [phone] ? ?  ? ? ?Mediterranean Diet ?A Mediterranean diet refers to food and lifestyle choices that are based on the traditions of countries located on the [employer]. This way of eating has been shown to help prevent certain conditions and improve outcomes for people who have chronic diseases, like kidney disease and heart disease. ?What are tips for following this plan? ?Lifestyle  ?Cook and eat meals together with your family, when possible. ?Drink enough fluid to keep your urine clear or pale yellow. ?Be physically active every day. This includes: ?Aerobic exercise like running or swimming. ?Leisure activities like gardening, walking, or housework. ?Get 7-8 hours of sleep each night. ?If recommended by your health care provider, drink red wine in moderation. This means 1 glass a day for nonpregnant women and 2 glasses a day for men. A glass of wine equals 5 oz (150 mL). ?Reading food labels  ?Check the serving size of packaged foods. For foods such as rice and pasta, the serving size refers to the amount of cooked product, not dry. ?Check the total fat in packaged foods. Avoid foods that have saturated fat or trans fats. ?Check the ingredients list for added sugars, such as corn syrup. ?Shopping  ?At the grocery store, buy most of your food from the areas near the walls of the store. This includes: ?Fresh fruits and vegetables (produce). ?Grains, beans, nuts, and seeds. Some of these may be available in unpackaged forms or large amounts (in bulk). ?Fresh seafood. ?Poultry and eggs. ?Low-fat dairy products. ?Buy whole ingredients instead of prepackaged foods. ?Buy fresh fruits and vegetables in-season from local farmers markets. ?Buy frozen fruits and vegetables in resealable bags. ?If you do not have access to quality fresh seafood, buy precooked frozen shrimp or canned fish, such as tuna, salmon, or sardines. ?Buy small amounts of raw or cooked vegetables, salads, or olives from the deli or  salad bar at your store. ?Stock your pantry so you always have certain foods on hand, such as olive oil, canned tuna, canned tomatoes, rice, pasta, and beans. ?Cooking  ?Cook foods with extra-virgin olive oil instead of using butter or other vegetable oils. ?Have meat as a side dish, and have vegetables or grains as your main dish. This means having meat in small portions or adding small amounts of meat to foods like pasta or stew. ?Use beans or vegetables instead of meat in common dishes like chili or lasagna. ?Experiment with different cooking methods. Try roasting or broiling vegetables instead of steaming or saut?eing them. ?Add frozen vegetables to soups, stews, pasta, or rice. ?Add nuts or seeds for added healthy fat at each meal. You can add these to yogurt, salads, or vegetable dishes. ?Marinate fish or vegetables using olive oil, lemon juice, garlic, and fresh herbs. ?Meal planning  ?Plan to eat 1 vegetarian meal one day each week. Try to work up to 2 vegetarian meals, if possible. ?Eat seafood 2 or more times a week. ?Have healthy snacks readily available, such as: ?Vegetable sticks with hummus. ?[place] yogurt. ?Fruit and nut trail mix. ?Eat balanced meals throughout the week. This includes: ?Fruit: 2-3 servings a day ?Vegetables: 4-5 servings a day ?Low-fat dairy: 2 servings a day ?Fish, poultry, or lean meat: 1 serving a day ?Beans and legumes: 2 or more servings a week ?Nuts and  seeds: 1-2 servings a day ?Whole grains: 6-8 servings a day ?Extra-virgin olive oil: 3-4 servings a day ?Limit red meat and sweets to only a few servings a month ?What are my food choices? ?Mediterranean diet ?Recommended ?Grains: Whole-grain pasta. Brown rice. Bulgar wheat. Polenta. Couscous. Whole-wheat bread. [place]. ?Vegetables: Artichokes. Beets. Broccoli. Cabbage. Carrots. Eggplant. Green beans. Chard. Kale. Spinach. Onions. Leeks. Peas. Squash. Tomatoes. Peppers. Radishes. ?Fruits: Apples. Apricots. Avocado.  Berries. Bananas. Cherries. Dates. Figs. Grapes. Lemons. Melon. Oranges. Peaches. Plums. Pomegranate. ?Meats and other protein foods: Beans. Almonds. Sunflower seeds. Pine nuts. Peanuts. [place]. Salmon. Scallops. Laure Kidney

## 2022-04-03 ENCOUNTER — Other Ambulatory Visit: Payer: Self-pay | Admitting: Adult Health

## 2022-06-08 DIAGNOSIS — L821 Other seborrheic keratosis: Secondary | ICD-10-CM | POA: Diagnosis not present

## 2022-06-08 DIAGNOSIS — D2261 Melanocytic nevi of right upper limb, including shoulder: Secondary | ICD-10-CM | POA: Diagnosis not present

## 2022-06-08 DIAGNOSIS — D225 Melanocytic nevi of trunk: Secondary | ICD-10-CM | POA: Diagnosis not present

## 2022-06-08 DIAGNOSIS — L570 Actinic keratosis: Secondary | ICD-10-CM | POA: Diagnosis not present

## 2022-06-08 DIAGNOSIS — D485 Neoplasm of uncertain behavior of skin: Secondary | ICD-10-CM | POA: Diagnosis not present

## 2022-06-08 DIAGNOSIS — Z85820 Personal history of malignant melanoma of skin: Secondary | ICD-10-CM | POA: Diagnosis not present

## 2022-06-08 DIAGNOSIS — L814 Other melanin hyperpigmentation: Secondary | ICD-10-CM | POA: Diagnosis not present

## 2022-07-01 ENCOUNTER — Other Ambulatory Visit: Payer: Self-pay | Admitting: Adult Health

## 2022-07-01 DIAGNOSIS — I10 Essential (primary) hypertension: Secondary | ICD-10-CM

## 2022-07-02 ENCOUNTER — Other Ambulatory Visit: Payer: Self-pay | Admitting: Adult Health

## 2022-09-28 ENCOUNTER — Other Ambulatory Visit: Payer: Self-pay | Admitting: Adult Health

## 2022-09-28 DIAGNOSIS — I10 Essential (primary) hypertension: Secondary | ICD-10-CM

## 2022-09-28 NOTE — Telephone Encounter (Signed)
Pt needs a CPE. Rx will be refilled for 30 days only.

## 2022-09-29 ENCOUNTER — Other Ambulatory Visit: Payer: Self-pay | Admitting: Adult Health

## 2022-10-13 ENCOUNTER — Encounter: Payer: Self-pay | Admitting: Physician Assistant

## 2022-10-13 ENCOUNTER — Ambulatory Visit: Payer: Medicare HMO | Admitting: Physician Assistant

## 2022-10-13 VITALS — BP 148/95 | HR 97 | Resp 18 | Ht 71.0 in | Wt 175.0 lb

## 2022-10-13 DIAGNOSIS — R413 Other amnesia: Secondary | ICD-10-CM

## 2022-10-13 DIAGNOSIS — F0280 Dementia in other diseases classified elsewhere without behavioral disturbance: Secondary | ICD-10-CM | POA: Diagnosis not present

## 2022-10-13 DIAGNOSIS — G3101 Pick's disease: Secondary | ICD-10-CM | POA: Diagnosis not present

## 2022-10-13 DIAGNOSIS — R69 Illness, unspecified: Secondary | ICD-10-CM | POA: Diagnosis not present

## 2022-10-13 NOTE — Patient Instructions (Signed)
It was a pleasure to see you today at our office.   Recommendations:  Meds: Follow up in 6  months Continue donepezil 10 mg daily. Side effects were discussed  Referral to speech therapy Repeat neuropsychological evaluation      RECOMMENDATIONS FOR ALL PATIENTS WITH MEMORY PROBLEMS: 1. Continue to exercise (Recommend 30 minutes of walking everyday, or 3 hours every week) 2. Increase social interactions - continue going to [place] and enjoy social gatherings with friends and family 3. Eat healthy, avoid fried foods and eat more fruits and vegetables 4. Maintain adequate blood pressure, blood sugar, and blood cholesterol level. Reducing the risk of stroke and cardiovascular disease also helps promoting better memory. 5. Avoid stressful situations. Live a simple life and avoid aggravations. Organize your time and prepare for the next day in anticipation. 6. Sleep well, avoid any interruptions of sleep and avoid any distractions in the bedroom that may interfere with adequate sleep quality 7. Avoid sugar, avoid sweets as there is a strong link between excessive sugar intake, diabetes, and cognitive impairment We discussed the Mediterranean diet, which has been shown to help patients reduce the risk of progressive memory disorders and reduces cardiovascular risk. This includes eating fish, eat fruits and green leafy vegetables, nuts like almonds and hazelnuts, walnuts, and also use olive oil. Avoid fast foods and fried foods as much as possible. Avoid sweets and sugar as sugar use has been linked to worsening of memory function.  There is always a concern of gradual progression of memory problems. If this is the case, then we may need to adjust level of care according to patient needs. Support, both to the patient and caregiver, should then be put into place.       FALL PRECAUTIONS: Be cautious when walking. Scan the area for obstacles that may increase the risk of trips and falls. When getting  up in the mornings, sit up at the edge of the bed for a few minutes before getting out of bed. Consider elevating the bed at the head end to avoid drop of blood pressure when getting up. Walk always in a well-lit room (use night lights in the walls). Avoid area rugs or power cords from appliances in the middle of the walkways. Use a walker or a cane if necessary and consider physical therapy for balance exercise. Get your eyesight checked regularly.  FINANCIAL OVERSIGHT: Supervision, especially oversight when making financial decisions or transactions is also recommended.  HOME SAFETY: Consider the safety of the kitchen when operating appliances like stoves, microwave oven, and blender. Consider having supervision and share cooking responsibilities until no longer able to participate in those. Accidents with firearms and other hazards in the house should be identified and addressed as well.   ABILITY TO BE LEFT ALONE: If patient is unable to contact 911 operator, consider using LifeLine, or when the need is there, arrange for someone to stay with patients. Smoking is a fire hazard, consider supervision or cessation. Risk of wandering should be assessed by caregiver and if detected at any point, supervision and safe proof recommendations should be instituted.  MEDICATION SUPERVISION: Inability to self-administer medication needs to be constantly addressed. Implement a mechanism to ensure safe administration of the medications.   DRIVING: Regarding driving, in patients with progressive memory problems, driving will be impaired. We advise to have someone else do the driving if trouble finding directions or if minor accidents are reported. Independent driving assessment is available to determine safety of driving.  If you are interested in the driving assessment, you can contact the following:  [employer] in [place]  [place]  [place] [phone] or [phone]      [place] refers to food and lifestyle choices that are based on the traditions of countries located on the [employer]. This way of eating has been shown to help prevent certain conditions and improve outcomes for people who have chronic diseases, like kidney disease and heart disease. What are tips for following this plan? Lifestyle  Cook and eat meals together with your family, when possible. Drink enough fluid to keep your urine clear or pale yellow. Be physically active every day. This includes: Aerobic exercise like running or swimming. Leisure activities like gardening, walking, or housework. Get 7-8 hours of sleep each night. If recommended by your health care provider, drink red wine in moderation. This means 1 glass a day for nonpregnant women and 2 glasses a day for men. A glass of wine equals 5 oz (150 mL). Reading food labels  Check the serving size of packaged foods. For foods such as rice and pasta, the serving size refers to the amount of cooked product, not dry. Check the total fat in packaged foods. Avoid foods that have saturated fat or trans fats. Check the ingredients list for added sugars, such as corn syrup. Shopping  At the grocery store, buy most of your food from the areas near the walls of the store. This includes: Fresh fruits and vegetables (produce). Grains, beans, nuts, and seeds. Some of these may be available in unpackaged forms or large amounts (in bulk). Fresh seafood. Poultry and eggs. Low-fat dairy products. Buy whole ingredients instead of prepackaged foods. Buy fresh fruits and vegetables in-season from local farmers markets. Buy frozen fruits and vegetables in resealable bags. If you do not have access to quality fresh seafood, buy precooked frozen shrimp or canned fish, such as tuna, salmon, or sardines. Buy  small amounts of raw or cooked vegetables, salads, or olives from the deli or salad bar at your store. Stock your pantry so you always have certain foods on hand, such as olive oil, canned tuna, canned tomatoes, rice, pasta, and beans. Cooking  Cook foods with extra-virgin olive oil instead of using butter or other vegetable oils. Have meat as a side dish, and have vegetables or grains as your main dish. This means having meat in small portions or adding small amounts of meat to foods like pasta or stew. Use beans or vegetables instead of meat in common dishes like chili or lasagna. Experiment with different cooking methods. Try roasting or broiling vegetables instead of steaming or sauteing them. Add frozen vegetables to soups, stews, pasta, or rice. Add nuts or seeds for added healthy fat at each meal. You can add these to yogurt, salads, or vegetable dishes. Marinate fish or vegetables using olive oil, lemon juice, garlic, and fresh herbs. Meal planning  Plan to eat 1 vegetarian meal one day each week. Try to work up to 2 vegetarian meals, if possible. Eat seafood 2 or more times a week. Have healthy snacks readily available, such as: Vegetable sticks with hummus. Greek yogurt. Fruit and nut trail mix. Eat balanced meals throughout the week. This includes: Fruit: 2-3 servings a day Vegetables: 4-5 servings a day Low-fat dairy: 2 servings a day Fish, poultry, or lean meat: 1 serving a day Beans and legumes: 2  or more servings a week Nuts and seeds: 1-2 servings a day Whole grains: 6-8 servings a day Extra-virgin olive oil: 3-4 servings a day Limit red meat and sweets to only a few servings a month What are my food choices? Mediterranean diet Recommended Grains: Whole-grain pasta. Brown rice. Bulgar wheat. Polenta. Couscous. Whole-wheat bread. [place]. Vegetables: Artichokes. Beets. Broccoli. Cabbage. Carrots. Eggplant. Green beans. Chard. Kale. Spinach. Onions. Leeks. Peas.  Squash. Tomatoes. Peppers. Radishes. Fruits: Apples. Apricots. Avocado. Berries. Bananas. Cherries. Dates. Figs. Grapes. Lemons. Melon. Oranges. Peaches. Plums. Pomegranate. Meats and other protein foods: Beans. Almonds. Sunflower seeds. Pine nuts. Peanuts. [place]. Salmon. Scallops. Shrimp. [place]. Tilapia. Clams. Oysters. Eggs. Dairy: Low-fat milk. Cheese. Greek yogurt. Beverages: Water. Red wine. Herbal tea. Fats and oils: Extra virgin olive oil. Avocado oil. Grape seed oil. Sweets and desserts: [place] yogurt with honey. Baked apples. Poached pears. Trail mix. Seasoning and other foods: Basil. Cilantro. Coriander. Cumin. Mint. Parsley. Sage. Rosemary. Tarragon. Garlic. Oregano. Thyme. Pepper. Balsalmic vinegar. Tahini. Hummus. Tomato sauce. Olives. Mushrooms. Limit these Grains: Prepackaged pasta or rice dishes. Prepackaged cereal with added sugar. Vegetables: Deep fried potatoes (french fries). Fruits: Fruit canned in syrup. Meats and other protein foods: Beef. Pork. Lamb. Poultry with skin. Hot dogs. [place] Salines. Dairy: Ice cream. Sour cream. Whole milk. Beverages: Juice. Sugar-sweetened soft drinks. Beer. Liquor and spirits. Fats and oils: Butter. Canola oil. Vegetable oil. Beef fat (tallow). Lard. Sweets and desserts: Cookies. Cakes. Pies. Candy. Seasoning and other foods: Mayonnaise. Premade sauces and marinades. The items listed may not be a complete list. Talk with your dietitian about what dietary choices are right for you. Summary The Mediterranean diet includes both food and lifestyle choices. Eat a variety of fresh fruits and vegetables, beans, nuts, seeds, and whole grains. Limit the amount of red meat and sweets that you eat. Talk with your health care provider about whether it is safe for you to drink red wine in moderation. This means 1 glass a day for nonpregnant women and 2 glasses a day for men. A glass of wine equals 5 oz (150 mL). This information is not intended to replace advice  given to you by your health care provider. Make sure you discuss any questions you have with your health care provider. Document Released: 07/06/2016 Document Revised: 08/08/2016 Document Reviewed: 07/06/2016 Elsevier Interactive Patient Education  2017 [employer].

## 2022-10-13 NOTE — Progress Notes (Signed)
Assessment/Plan:   Memory impairment Primary Progressive Aphasia  Robert Mann is a very pleasant 71 y.o. 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  His aphasia seems to be progressing.  His memory is stable.  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 presenting today in follow-up for evaluation of memory loss. Patient is on donepezil 10 mg daily, tolerating well. MMSE today is  24 /30.   Recommendations:   Follow up in 6  months. Continue donepezil 10 mg daily, side effects discussed Continue to control mood as per PCP Repeat Neuropsych evaluation for clarity of diagnosis  Referral to speech therapy    Subjective:   This patient is here alone. Previous records as well as any outside records available were reviewed prior to todays visit.   Last seen on 03/2022     Any changes in memory since last visit?  Patient reports the memory  "about the same ".  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  "I cannot put the letter in the proper place, It is not what I want to write ".  He did speech therapy, completing it, but he has not been doing speech therapy at home.   He has been writing down what he needs to say before making a phone call", also he is using gestures to increase his communication and conversation. He uses a pad because writing is less difficult.  His wife designs tasks for him to do and they go over it together. He does enjoy reading, does yard work, [place]. repeats oneself?  Denies Disoriented when  walking into a room?  Patient denies   Leaving objects in unusual places?  Patient denies   Ambulates  with difficulty?   Patient denies. Walks 10000 steps a day, has not been golfing as much lately.    Recent falls?  Patient denies   Any head injuries?  Patient denies   History of seizures?   Patient denies   Wandering behavior?  Patient denies   Patient drives?  He continues to drive, he denies any difficulties, he denies getting lost. Any mood changes since last visit?  Patient denies   Any worsening depression?:  Patient denies, he tries to remain positive. Hallucinations?  Patient denies   Paranoia?  Patient denies   Patient reports that sleeps well without vivid dreams, REM behavior or sleepwalking   History of sleep apnea?  Patient denies   Any hygiene concerns?  Patient denies   Independent of bathing and dressing?  Endorsed  Does the patient needs help with medications?  Patient is in charge  Who is in charge of the finances?  Patient is in charge    Any changes in appetite?  Patient denies    Patient have trouble swallowing? Patient denies   Does the patient cook?  Patient denies   Any kitchen accidents such as leaving the stove on? Patient denies   Any headaches?  Patient denies   Double vision? Patient denies   Any focal numbness or tingling?  Patient denies  Chronic back pain he has a history of sciatica, but he is trying to remain active, walking at least 10,000 steps a day. Unilateral weakness?  Patient denies   Any tremors?  Patient denies   Any history of anosmia?  Patient denies   Any incontinence of urine?  Patient denies   Any bowel dysfunction?   Patient denies      Patient lives  with his wife   Labs 09/08/21 CBC, CMP normal TC 227, TG 153, LDL 152  TSH 1.79 B12 >2000 04/2021   MRI brain wo contrast 08/18/21 No evidence of acute intracranial abnormality. Prominent perivascular space versus chronic lacunar infarct within the central pons. Moderate  generalized cerebral atrophy with comparatively mild cerebellar atrophy. Otherwise unremarkable MRI appearance of the brain for age. Small left maxillary sinus mucous retention cyst.     Neuropsychological evaluation on 09/02/2021 Dr. [name] "Briefly, results suggested diffuse cognitive impairment relative to age-matched peers. He did exhibit appropriate performances across visuospatial abilities and confrontation naming. However, all other assessed domains exhibited severe cognitive impairment. 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 This neurodegenerative disease process is characterized by pronounced impairment with language and executive dysfunction when in earlier stages. While Robert Mann showed relatively diffuse impairment, these areas arguably exhibited greatest impairment. In particular, the non-fluent PPA subtype is characterized by the evolution of significant word finding difficulties and halting, effortful speech. These symptoms were very prevalent during the current evaluation, as well as reported by Robert Mann and his wife as being present and worsening over the past 1-2 years. Specific to Alzheimer's disease, Robert Mann did show significant memory impairment across testing. However, it is worth highlighting that he exhibited appropriate retention scores across 3/4 memory tasks. This would suggest that greater memory difficulty is across encoding (i.e., learning) and retrieval aspects of memory, likely worsened by profound deficits in receptive and expressive language. Strong retention scores would not suggest a primary memory storage deficit, which would be inconsistent with typically presenting Alzheimer's disease"  When meeting with his neurologist, I would recommend that they discuss neurological medications which may slow decline. It is important to highlight that these medications may slow functional decline  in some individuals. Unfortunately, there is no current treatment which is able to stop or reverse these changes. To aid with further diagnostic clarity, they could also discuss the pros and cons of an FDG-PET scan as this can yield helpful information in cases such as this.       Initial Visit  09/15/21 The patient is seen in neurologic consultation at the request of [name], Robert Rumps, NP for the evaluation of memory loss in the setting of Primary Progressive Aphasia.  The patient is here alone.  This is a 70 year old right-handed male who had memory issues for about 2 years.  He describes it as "I have the words in my head, but can get them out ".  This has been worse over the last 6 months.  He lives with his wife who has also noticed the same changes.  His mood is good, but he is also very cautious about talking to people, which has made him more withdrawn, and reviewed that he is afraid that people will notice.  He denies depression or irritability.  He likes to read, but he has to sometimes read the same article several times until "getting it ".  During the visit, he has shown some frustration in trying to  find the right word.  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  He noted yes the porch.   He sleeps well, denies vivid dreams or sleepwalking, hallucinations or paranoia.  He denies leaving objects in unusual places, but cannot remember at times where he left him.  He is independent of bathing and dressing, no issues with hygiene.  He does not forget to take his medications, he is in charge of his finances, denies missing any bills.  His appetite is good, denies any trouble swallowing, or sialorrhea.  He has a history of reflux, that at times causes him to have some throat discomfort.  His wife does the cooking.  He ambulates about 10 miles a day without difficulties, denies any falls.   Over the years he had several minor injuries in the head, stating that "I build stuff and hit my head several times ".  He continues to drive without getting lost.  He denies any headaches, double vision, dizziness, focal numbness or tingling, unilateral weakness, tremors or anosmia.  No history of seizures.  Denies urine incontinence, retention constipation or diarrhea.  He denies a history of sleep apnea, but has he does say that at times he notices snoring.  He denies a history of alcohol or tobacco.  Family history negative for dementia.  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 SCHEDULE APPT FOR ANY FUTURE REFILLS   Blood Pressure Monitoring (BLOOD PRESSURE MONITOR/M CUFF) MISC One cuff for management of hypertension   donepezil (ARICEPT) 10 MG tablet Take 1 tablet at night   fexofenadine (ALLEGRA ODT) 30 MG disintegrating tablet Take 30 mg by mouth daily.   losartan (COZAAR) 25 MG tablet TAKE 1 TABLET (25 MG TOTAL) BY MOUTH DAILY. SCHEDULE APPT FOR FUTURE REFILLS   naproxen sodium (ALEVE) 220 MG tablet Take 220 mg by mouth as needed.   Multiple Vitamins-Minerals (CENTRUM SILVER PO) Take 1 tablet  by mouth daily. (Patient not taking: Reported on 10/13/2022)   [DISCONTINUED] Cholecalciferol (VITAMIN D3) 20 MCG TABS Take 25 mcg by mouth daily.   [DISCONTINUED] Coenzyme Q10 (CO Q 10 PO) Take 300 mg by mouth daily.    [DISCONTINUED] Glucosamine-Chondroit-Vit C-Mn (GLUCOSAMINE CHONDR 1500 COMPLX PO) Take 1 tablet by mouth daily.   [DISCONTINUED] Omega-3 Fatty Acids (FISH OIL PO) Take 1 capsule by mouth daily.   Facility-Administered Encounter Medications as of 10/13/2022  Medication   0.9 %  sodium chloride infusion     Objective:     PHYSICAL EXAMINATION:    VITALS:  Vitals:   10/13/22 0843  BP: (!) 148/95  Pulse: 97  Resp: 18  SpO2: 97%  Weight: 175 lb (79.4 kg)  Height: '5\' 11"'$  (1.803 m)    GEN:  The patient appears stated age and is in NAD. HEENT:  Normocephalic, atraumatic.   Neurological examination:  General: NAD, well-groomed, appears stated age. Orientation: The patient is alert. Oriented to person, place and date.  (He meant to say 2023, but he said 2043 although he was visibly frustrated that he could not say the right year despite knowing it) Cranial nerves: There is good facial symmetry.The speech is nonfluent but clear.  He has obvious aphasia without dysarthria.   Fund of knowledge is appropriate. Recent memory impaired and remote memory is normal.  Attention and concentration are normal.  Able to name objects and has difficulty repeating a phrase.  Hearing is intact to conversational tone.    Sensation: Sensation is intact to light touch throughout Motor: Strength is at least antigravity x4. Tremors: none  DTR's 2/4 in UE/LE       No data to display             10/13/2022    9:00 AM  MMSE - Mini Mental State Exam  Orientation to time 4  Orientation to Place 5  Registration 3  Attention/ Calculation 2  Recall 3  Language- name 2 objects 2  Language- repeat 0  Language- follow 3 step command 3  Language- read & follow direction 1  Write a  sentence 0  Copy design 1  Total score 24       Movement examination:  Tone: There is normal tone in the UE/LE Abnormal movements:  no tremor.  No myoclonus.  No asterixis.   Coordination:  There is no decremation with RAM's.  Normal right finger-to-nose, on the left, when asked to touch his nose he uses the right hand,  apraxia is noted. Gait and Station: The patient has no difficulty arising out of a deep-seated chair without the use of the hands. The patient's stride length is good.  Gait is cautious and narrow.   Thank you for allowing [place] the opportunity to participate in the care of this nice patient. Please do not hesitate to contact us for any questions or concerns.   Total time spent on today's visit was 33 minutes dedicated to this patient today, preparing to see patient, examining the patient, ordering tests and/or medications and counseling the patient, documenting clinical information in the EHR or other health record, independently interpreting results and communicating results to the patient/family, discussing treatment and goals, answering patient's questions and coordinating care.  Cc:  Dorothyann Peng, NP  Sharene Butters 10/13/2022 10:09 AM

## 2022-10-21 ENCOUNTER — Other Ambulatory Visit: Payer: Self-pay | Admitting: Adult Health

## 2022-10-21 DIAGNOSIS — I10 Essential (primary) hypertension: Secondary | ICD-10-CM

## 2022-12-02 ENCOUNTER — Telehealth: Payer: Medicare HMO | Admitting: Nurse Practitioner

## 2022-12-02 DIAGNOSIS — H5789 Other specified disorders of eye and adnexa: Secondary | ICD-10-CM

## 2022-12-02 MED ORDER — POLYMYXIN B-TRIMETHOPRIM 10000-0.1 UNIT/ML-% OP SOLN: 2.0000 [drp] | OPHTHALMIC | 0 refills | Status: DC

## 2022-12-02 NOTE — Progress Notes (Signed)

## 2022-12-20 ENCOUNTER — Other Ambulatory Visit: Payer: Self-pay | Admitting: Adult Health

## 2022-12-20 DIAGNOSIS — I10 Essential (primary) hypertension: Secondary | ICD-10-CM

## 2022-12-20 NOTE — Telephone Encounter (Signed)
Patient need to schedule a CPE for more refills. 

## 2022-12-22 ENCOUNTER — Encounter: Payer: Self-pay | Admitting: Adult Health

## 2022-12-22 ENCOUNTER — Ambulatory Visit (INDEPENDENT_AMBULATORY_CARE_PROVIDER_SITE_OTHER): Payer: Medicare HMO | Admitting: Adult Health

## 2022-12-22 VITALS — BP 130/80 | HR 91 | Temp 97.9°F | Ht 70.0 in | Wt 172.0 lb

## 2022-12-22 DIAGNOSIS — G3101 Pick's disease: Secondary | ICD-10-CM

## 2022-12-22 DIAGNOSIS — E782 Mixed hyperlipidemia: Secondary | ICD-10-CM | POA: Diagnosis not present

## 2022-12-22 DIAGNOSIS — R69 Illness, unspecified: Secondary | ICD-10-CM | POA: Diagnosis not present

## 2022-12-22 DIAGNOSIS — Z0000 Encounter for general adult medical examination without abnormal findings: Secondary | ICD-10-CM

## 2022-12-22 DIAGNOSIS — N400 Enlarged prostate without lower urinary tract symptoms: Secondary | ICD-10-CM | POA: Diagnosis not present

## 2022-12-22 DIAGNOSIS — I10 Essential (primary) hypertension: Secondary | ICD-10-CM

## 2022-12-22 DIAGNOSIS — F09 Unspecified mental disorder due to known physiological condition: Secondary | ICD-10-CM | POA: Diagnosis not present

## 2022-12-22 DIAGNOSIS — F0280 Dementia in other diseases classified elsewhere without behavioral disturbance: Secondary | ICD-10-CM

## 2022-12-22 LAB — LIPID PANEL
Cholesterol: 276 mg/dL — ABNORMAL HIGH (ref 0–200)
HDL: 46.2 mg/dL (ref >39.00)
NonHDL: 229.86
Total CHOL/HDL Ratio: 6
Triglycerides: 211 mg/dL — ABNORMAL HIGH (ref 0.0–149.0)
VLDL: 42.2 mg/dL — ABNORMAL HIGH (ref 0.0–40.0)

## 2022-12-22 LAB — COMPREHENSIVE METABOLIC PANEL
ALT: 22 U/L (ref 0–53)
AST: 16 U/L (ref 0–37)
Albumin: 4.2 g/dL (ref 3.5–5.2)
Alkaline Phosphatase: 76 U/L (ref 39–117)
BUN: 16 mg/dL (ref 6–23)
CO2: 31 mEq/L (ref 19–32)
Calcium: 9.5 mg/dL (ref 8.4–10.5)
Chloride: 104 mEq/L (ref 96–112)
Creatinine, Ser: 1.07 mg/dL (ref 0.40–1.50)
GFR: 70.37 mL/min (ref >60.00)
Glucose, Bld: 113 mg/dL — ABNORMAL HIGH (ref 70–99)
Potassium: 5 mEq/L (ref 3.5–5.1)
Sodium: 141 mEq/L (ref 135–145)
Total Bilirubin: 0.5 mg/dL (ref 0.2–1.2)
Total Protein: 7.5 g/dL (ref 6.0–8.3)

## 2022-12-22 LAB — CBC
HCT: 50.1 % (ref 39.0–52.0)
Hemoglobin: 16.7 g/dL (ref 13.0–17.0)
MCHC: 33.4 g/dL (ref 30.0–36.0)
MCV: 89.5 fl (ref 78.0–100.0)
Platelets: 298 10*3/uL (ref 150.0–400.0)
RBC: 5.59 Mil/uL (ref 4.22–5.81)
RDW: 14 % (ref 11.5–15.5)
WBC: 7.8 10*3/uL (ref 4.0–10.5)

## 2022-12-22 LAB — TSH: TSH: 2.02 u[IU]/mL (ref 0.35–5.50)

## 2022-12-22 LAB — PSA: PSA: 3.04 ng/mL (ref 0.10–4.00)

## 2022-12-22 LAB — LDL CHOLESTEROL, DIRECT: Direct LDL: 195 mg/dL

## 2022-12-22 MED ORDER — LOSARTAN POTASSIUM 25 MG PO TABS: 25.0000 mg | ORAL_TABLET | Freq: Every day | ORAL | 3 refills | Status: DC

## 2022-12-22 MED ORDER — AMLODIPINE BESYLATE 5 MG PO TABS: 5.0000 mg | ORAL_TABLET | Freq: Every day | ORAL | 3 refills | Status: DC

## 2022-12-22 NOTE — Patient Instructions (Signed)
It was great seeing you today   We will follow up with you regarding your lab work   Please let me know if you need anything   

## 2022-12-22 NOTE — Progress Notes (Signed)
Subjective:    Patient ID: Jacqulynn Cadet, male    DOB: 1951-03-07, 72 y.o.   MRN: [id number]  HPI Patient presents for yearly preventative medicine examination. 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  HTN -currently prescribed Norvasc 5 mg and losartan 25 mg daily.  He denies dizziness, lightheadedness, chest pain, or shortness of breath.  Does monitor his blood pressures at home periodically and reports readings in the 120s to 130s over 70s to 80s. BP Readings from Last 3 Encounters:  12/22/22 130/80  10/13/22 (!) 148/95  03/30/22 (!) 154/97   Hyperlipidemia -has refused statins in the past. Lab Results  Component Value Date   CHOL 227 (H) 09/08/2021   HDL 44.60 09/08/2021   LDLCALC 152 (H) 09/08/2021   TRIG 153.0 (H) 09/08/2021   CHOLHDL 5 09/08/2021    Primary progressive aphasia-he is seen by neurology for this.  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  Currently managed with Aricept 10 mg daily.  His aphasia seems to be seen but memory is stable.  All immunizations and health maintenance protocols were reviewed with the patient and needed orders were placed.   Appropriate screening laboratory values were ordered for the patient including screening of hyperlipidemia, renal function and hepatic function. If indicated by BPH, a PSA was ordered.  Medication reconciliation,  past medical history, social history, problem list and allergies were reviewed in detail with the patient  Goals were established with regard to weight loss, exercise, and  diet in compliance with  medications. He walks about 35 miles a week and eats healthy    Review of Systems  Constitutional: Negative.   HENT: Negative.    Eyes: Negative.   Respiratory: Negative.    Cardiovascular: Negative.   Gastrointestinal: Negative.   Endocrine: Negative.   Genitourinary: Negative.   Musculoskeletal: Negative.   Skin: Negative.   Allergic/Immunologic: Negative.   Neurological: Negative.        Aphasia   Hematological: Negative.   Psychiatric/Behavioral: Negative.    All other systems reviewed and are negative.  Past Medical History:  Diagnosis Date   Essential hypertension    History of kidney stones    History of melanoma 01/2018   left chest   History of placement of chest tube 10/1979   History of pneumothorax 1980   Hyperlipidemia    Mild neurocognitive disorder, 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    Social History   Socioeconomic History   Marital status: Married    Spouse name: Not on file   Number of children: 2   Years of education: 14   Highest education level: Some college, no degree  Occupational History   Occupation: part time   Tobacco Use   Smoking status: Former    Types: Cigarettes    Quit date: 08/08/1980    Years since quitting: 42.4   Smokeless tobacco: Never   Tobacco comments:    Quit smoking cigarettes in 1981  Vaping Use   Vaping Use: Never used  Substance and Sexual Activity   Alcohol use: Yes    Comment: social    Drug use: No   Sexual  activity: Not on file  Other Topics Concern   Not on file  Social History Narrative   Working PT; job requires a tremendous amount of walking   Married    3 children   Social Determinants of Health   Financial Resource Strain: [place]  (01/19/2022)   Overall Financial Resource Strain (CARDIA)    Difficulty of Paying Living Expenses: Not hard at all  Food Insecurity: No Food Insecurity (01/19/2022)   Hunger Vital Sign    Worried About Running Out of Food in the Last Year:  Never true    [place] in the Last Year: Never true  Transportation Needs: No Transportation Needs (01/19/2022)   PRAPARE - Hydrologist (Medical): No    Lack of Transportation (Non-Medical): No  Physical Activity: Sufficiently Active (01/19/2022)   Exercise Vital Sign    Days of Exercise per Week: 7 days    Minutes of Exercise per Session: 150+ min  Stress: No Stress Concern Present (01/19/2022)   Show Low    Feeling of Stress : Not at all  Social Connections: Moderately Isolated (01/19/2022)   Social Connection and Isolation Panel [NHANES]    Frequency of Communication with Friends and Family: More than three times a week    Frequency of Social Gatherings with Friends and Family: More than three times a week    Attends Religious Services: Never    Marine scientist or Organizations: No    Attends Archivist Meetings: Never    Marital Status: Married  Human resources officer Violence: Not At Risk (01/19/2022)   Humiliation, Afraid, Rape, and Kick questionnaire    Fear of Current or Ex-Partner: No    Emotionally Abused: No    Physically Abused: No    Sexually Abused: No    Past Surgical History:  Procedure Laterality Date   MELANOMA EXCISION WITH SENTINEL LYMPH NODE BIOPSY Left 02/08/2018   Procedure: WIDE EXCISION OF LEFT CHEST MELANOMA AND LEFT AXILLARY SENTINEL LYMPH NODE;  Surgeon: Georganna Skeans, MD;  Location: [facility];  Service: General;  Laterality: Left;   WISDOM TOOTH EXTRACTION      Family History  Problem Relation Age of Onset   Hypertension Mother    Stroke Mother    Hyperlipidemia Mother    Heart disease Mother    Hypertension Father    Heart attack Father    Heart disease Father    Hyperlipidemia Father    Cancer Brother 55       Glioblastoma     Allergies  Allergen Reactions   Lisinopril Cough    Current Outpatient Medications on File Prior to  Visit  Medication Sig Dispense Refill   amLODipine (NORVASC) 5 MG tablet TAKE 1 TABLET (5 MG TOTAL) BY MOUTH DAILY. SCHEDULE APPT FOR ANY FUTURE REFILLS 30 tablet 0   Blood Pressure Monitoring (BLOOD PRESSURE MONITOR/M CUFF) MISC One cuff for management of hypertension 1 each 0   donepezil (ARICEPT) 10 MG tablet Take 1 tablet at night 30 tablet 11   fexofenadine (ALLEGRA ODT) 30 MG disintegrating tablet Take 30 mg by mouth daily.     losartan (COZAAR) 25 MG tablet TAKE 1 TABLET (25 MG TOTAL) BY MOUTH DAILY. SCHEDULE APPT FOR FUTURE REFILLS 90 tablet 0   Multiple Vitamins-Minerals (CENTRUM SILVER PO) Take 1 tablet by mouth daily.     naproxen sodium (ALEVE) 220 MG tablet Take 220 mg by  mouth as needed.     trimethoprim-polymyxin b (POLYTRIM) ophthalmic solution Place 2 drops into both eyes every 4 (four) hours. 10 mL 0   Current Facility-Administered Medications on File Prior to Visit  Medication Dose Route Frequency Provider Last Rate Last Admin   0.9 %  sodium chloride infusion  500 mL Intravenous Once Nandigam, Kavitha V, MD        BP 130/80   Pulse 91   Temp 97.9 F (36.6 C) (Oral)   Ht '5\' 10"'$  (1.778 m)   Wt 172 lb (78 kg)   SpO2 98%   BMI 24.68 kg/m       Objective:   Physical Exam Vitals and nursing note reviewed.  Constitutional:      General: He is not in acute distress.    Appearance: Normal appearance. He is not ill-appearing.  HENT:     Head: Normocephalic and atraumatic.     Right Ear: Tympanic membrane, ear canal and external ear normal. There is no impacted cerumen.     Left Ear: Tympanic membrane, ear canal and external ear normal. There is no impacted cerumen.     Nose: Nose normal. No congestion or rhinorrhea.     Mouth/Throat:     Mouth: Mucous membranes are moist.     Pharynx: Oropharynx is clear.  Eyes:     Extraocular Movements: Extraocular movements intact.     Conjunctiva/sclera: Conjunctivae normal.     Pupils: Pupils are equal, round, and reactive  to light.  Neck:     Vascular: No carotid bruit.  Cardiovascular:     Rate and Rhythm: Normal rate and regular rhythm.     Pulses: Normal pulses.     Heart sounds: No murmur heard.    No friction rub. No gallop.  Pulmonary:     Effort: Pulmonary effort is normal.     Breath sounds: Normal breath sounds.  Abdominal:     General: Abdomen is flat. Bowel sounds are normal. There is no distension.     Palpations: Abdomen is soft. There is no mass.     Tenderness: There is no abdominal tenderness. There is no guarding or rebound.     Hernia: No hernia is present.  Musculoskeletal:        General: Normal range of motion.     Cervical back: Normal range of motion and neck supple.  Lymphadenopathy:     Cervical: No cervical adenopathy.  Skin:    General: Skin is warm and dry.     Capillary Refill: Capillary refill takes less than 2 seconds.  Neurological:     General: No focal deficit present.     Mental Status: He is alert and oriented to person, place, and time.     Sensory: Sensation is intact.     Motor: Motor function is intact.     Coordination: Coordination is intact.     Gait: Gait is intact.     Comments: The speech is nonfluent but clear.  He has obvious aphasia without dysarthria. His recent memory seems impaired but remote memory is WNL    Psychiatric:        Mood and Affect: Mood normal.        Behavior: Behavior normal.        Thought Content: Thought content normal.        Judgment: Judgment normal.        Assessment & Plan:  1. Routine general medical examination at a health care facility  Today patient counseled on age appropriate routine health concerns for screening and prevention, each reviewed and up to date or declined. Immunizations reviewed and up to date or declined. Labs ordered and reviewed. Risk factors for depression reviewed and negative. Hearing function and visual acuity are intact. ADLs screened and addressed as needed. Functional ability and level  of safety reviewed and appropriate. Education, counseling and referrals performed based on assessed risks today. Patient provided with a copy of personalized plan for preventive services. - Advised to get shingrex at pharmacy  - Follow up in one year or sooner if needed   2. Essential hypertension - Well controlled. No change in medication  - Lipid panel; Future - TSH; Future - CBC; Future - Comprehensive metabolic panel; Future - amLODipine (NORVASC) 5 MG tablet; Take 1 tablet (5 mg total) by mouth daily. SCHEDULE APPT FOR ANY FUTURE REFILLSTAKE 1 TABLET (5 MG TOTAL) BY MOUTH DAILY.  Dispense: 90 tablet; Refill: 3  3. Mixed hyperlipidemia - Consider statin  - Lipid panel; Future - TSH; Future - CBC; Future - Comprehensive metabolic panel; Future  4. Primary progressive aphasia - Per neurology  - Lipid panel; Future - TSH; Future - CBC; Future - Comprehensive metabolic panel; Future  5. Cognitive disorder - Per Neurology  - Lipid panel; Future - TSH; Future - CBC; Future - Comprehensive metabolic panel; Future  6. Benign prostatic hyperplasia without lower urinary tract symptoms  - PSA; Future  Dorothyann Peng, NP

## 2023-01-22 ENCOUNTER — Telehealth: Payer: Self-pay

## 2023-01-22 NOTE — Telephone Encounter (Signed)
Contacted patient on preferred number listed in notes for scheduled AWV. Patient stated I decided to refuse visit this year.

## 2023-01-30 ENCOUNTER — Telehealth: Payer: Self-pay | Admitting: Adult Health

## 2023-01-30 NOTE — Telephone Encounter (Signed)
Called patient to schedule Medicare Annual Wellness Visit (AWV). Left message for patient to call back and schedule Medicare Annual Wellness Visit (AWV).  Last date of AWV: 01/19/22  Please schedule an appointment at any time with [facility] or [employer].  If any questions, please contact me at [phone].  Thank you ,  Barkley Boards AWV direct phone # [phone]

## 2023-02-06 ENCOUNTER — Telehealth: Payer: Self-pay | Admitting: Adult Health

## 2023-02-06 NOTE — Telephone Encounter (Signed)
Contacted [employer] Event organiser Bednarczyk to schedule their annual wellness visit. Appointment made for 02/06/22.  Barkley Boards AWV direct phone # [phone]

## 2023-02-07 ENCOUNTER — Ambulatory Visit (INDEPENDENT_AMBULATORY_CARE_PROVIDER_SITE_OTHER): Payer: Medicare HMO

## 2023-02-07 VITALS — BP 120/60 | HR 73 | Temp 97.9°F | Ht 70.0 in | Wt 173.8 lb

## 2023-02-07 DIAGNOSIS — Z0000 Encounter for general adult medical examination without abnormal findings: Secondary | ICD-10-CM | POA: Diagnosis not present

## 2023-02-07 NOTE — Progress Notes (Signed)
Subjective:   Robert Mann is a 72 y.o. male who presents for Medicare Annual/Subsequent preventive examination.  Review of Systems      Cardiac Risk Factors include: advanced age (>10mn, >>9women);hypertension;male gender     Objective:    Today's Vitals   02/07/23 0816  BP: 120/60  Pulse: 73  Temp: 97.9 F (36.6 C)  TempSrc: Oral  SpO2: 97%  Weight: 173 lb 12.8 oz (78.8 kg)  Height: '5\' 10"'$  (1.778 m)   Body mass index is 24.94 kg/m.     02/07/2023    8:37 AM 10/13/2022    8:44 AM 03/30/2022   12:53 PM 01/19/2022    3:27 PM 09/23/2021    2:58 PM 09/15/2021    1:04 PM 12/31/2019    1:30 PM  Advanced Directives  Does Patient Have a Medical Advance Directive? Yes Yes Yes Yes Yes Yes Yes  Type of AParamedicof [place]ed Lion[place] will   [place] will   [place] will  Does patient want to make changes to medical advance directive?    No - Patient declined   No - Patient declined  Copy of [place] Chart? No - copy requested   No - copy requested   No - copy requested    Current Medications (verified) Outpatient Encounter Medications as of 02/07/2023  Medication Sig   amLODipine (NORVASC) 5 MG tablet Take 1 tablet (5 mg total) by mouth daily. SCHEDULE APPT FOR ANY FUTURE REFILLSTAKE 1 TABLET (5 MG TOTAL) BY MOUTH DAILY.   Blood Pressure Monitoring (BLOOD PRESSURE MONITOR/M CUFF) MISC One cuff for management of hypertension   donepezil (ARICEPT) 10 MG tablet Take 1 tablet at night   fexofenadine (ALLEGRA ODT) 30 MG disintegrating tablet Take 30 mg by mouth daily.   losartan (COZAAR) 25 MG tablet Take 1 tablet (25 mg total) by mouth daily.   Multiple Vitamins-Minerals (CENTRUM SILVER PO) Take 1 tablet by mouth daily.   naproxen sodium (ALEVE) 220 MG tablet Take 220 mg by mouth as needed.   trimethoprim-polymyxin b (POLYTRIM) ophthalmic solution Place 2 drops into both  eyes every 4 (four) hours.   Facility-Administered Encounter Medications as of 02/07/2023  Medication   0.9 %  sodium chloride infusion    Allergies (verified) Lisinopril   History: Past Medical History:  Diagnosis Date   Essential hypertension    History of kidney stones    History of melanoma 01/2018   left chest   History of placement of chest tube 10/1979   History of pneumothorax 1980   Hyperlipidemia    Mild neurocognitive disorder, 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   Past Surgical History:  Procedure Laterality Date   MELANOMA EXCISION WITH SENTINEL LYMPH NODE BIOPSY Left 02/08/2018   Procedure: WIDE EXCISION OF LEFT CHEST MELANOMA AND LEFT AXILLARY SENTINEL LYMPH NODE;  Surgeon: TGeorganna Skeans MD;  Location: [facility]  Service: General;  Laterality: Left;   WISDOM TOOTH EXTRACTION     Family History  Problem Relation Age of Onset   Hypertension Mother    Stroke Mother    Hyperlipidemia Mother    Heart disease Mother    Hypertension Father    Heart attack Father    Heart disease Father    Hyperlipidemia Father    Cancer Brother 481      Glioblastoma    Social History   Socioeconomic History   Marital status:  Married    Spouse name: Not on file   Number of children: 2   Years of education: 52   Highest education level: Some college, no degree  Occupational History   Occupation: part time   Tobacco Use   Smoking status: Former    Types: Cigarettes    Quit date: 08/08/1980    Years since quitting: 42.5   Smokeless tobacco: Never   Tobacco comments:    Quit smoking cigarettes in 1981  Vaping Use   Vaping Use: Never used  Substance and Sexual Activity   Alcohol use: Yes    Comment: social    Drug use: No   Sexual activity: Not on file  Other Topics Concern   Not on file  Social History Narrative   Working PT; job requires a tremendous amount of walking   Married    3 children   Social Determinants of Health   Financial  Resource Strain: [place]  (02/07/2023)   Overall Financial Resource Strain (CARDIA)    Difficulty of Paying Living Expenses: Not hard at all  Food Insecurity: No [place] (02/07/2023)   Hunger Vital Sign    Worried About Running Out of Food in the Last Year: Never true    [place] in the Last Year: Never true  Transportation Needs: No Transportation Needs (02/07/2023)   PRAPARE - Hydrologist (Medical): No    Lack of Transportation (Non-Medical): No  Physical Activity: Sufficiently Active (02/07/2023)   Exercise Vital Sign    Days of Exercise per Week: 5 days    Minutes of Exercise per Session: 60 min  Stress: No Stress Concern Present (02/07/2023)   [place]    Feeling of Stress : Not at all  Social Connections: Moderately Isolated (02/07/2023)   Social Connection and Isolation Panel [NHANES]    Frequency of Communication with Friends and Family: More than three times a week    Frequency of Social Gatherings with Friends and Family: More than three times a week    Attends Religious Services: Never    Marine scientist or Organizations: No    Attends Music therapist: Never    Marital Status: Married    Tobacco Counseling Counseling given: Not Answered Tobacco comments: Quit smoking cigarettes in 1981   Clinical Intake:  Pre-visit preparation completed: No  Pain : No/denies pain     BMI - recorded: 24.94 Nutritional Status: BMI of 19-24  Normal Nutritional Risks: None Diabetes: No  How often do you need to have someone help you when you read instructions, pamphlets, or other written materials from your doctor or pharmacy?: 1 - Never  Diabetic?  No  Interpreter Needed?: No  Information entered by :: Rolene Arbour LPN   Activities of Daily Living    02/07/2023    8:32 AM  In your present state of health, do you have any difficulty  performing the following activities:  Hearing? 0  Vision? 0  Difficulty concentrating or making decisions? 0  Walking or climbing stairs? 0  Dressing or bathing? 0  Doing errands, shopping? 0  Preparing Food and eating ? N  Using the Toilet? N  In the past six months, have you accidently leaked urine? N  Do you have problems with loss of bowel control? N  Managing your Medications? N  Managing your Finances? N  Housekeeping or managing your Housekeeping? N  Patient Care Team: Dorothyann Peng, NP as PCP - General (Family Medicine)  Indicate any recent Medical Services you may have received from other than [facility] providers in the past year (date may be approximate).     Assessment:   This is a routine wellness examination for Ayoub.  Hearing/Vision screen Hearing Screening - Comments:: Denies hearing difficulties   Vision Screening - Comments:: Wears rx glasses - up to date with routine eye exams with  [facility]  Dietary issues and exercise activities discussed: Current Exercise Habits: Home exercise routine, Type of exercise: walking;stretching, Time (Minutes): 60, Frequency (Times/Week): 5, Weekly Exercise (Minutes/Week): 300, Intensity: Moderate, Exercise limited by: None identified   Goals Addressed               This Visit's Progress     Stay Healthy (pt-stated)        Stay alive.       Depression Screen    02/07/2023    8:17 AM 01/19/2022    3:22 PM 04/29/2021    4:08 PM 12/31/2019    1:30 PM  PHQ 2/9 Scores  PHQ - 2 Score 0 0 0 0    Fall Risk    02/07/2023    8:33 AM 10/13/2022    8:44 AM 03/30/2022   12:53 PM 01/19/2022    3:26 PM 01/12/2022    4:32 PM  Fall Risk   Falls in the past year? 1 0 0 0 0  Number falls in past yr: 0 0 0 0 0  Injury with Fall? 1 0 0 0 0  Comment [place] to left shoulder. Medical attention required.      Risk for fall due to : No Fall Risks   No Fall Risks   Follow up Falls prevention discussed Falls evaluation completed        [place]:  Any stairs in or around the home? Yes  If so, are there any without handrails? No  Home free of loose throw rugs in walkways, pet beds, electrical cords, etc? Yes Adequate lighting in your home to reduce risk of falls? Yes   ASSISTIVE DEVICES UTILIZED TO PREVENT FALLS:  Life alert? No  Use of a cane, walker or w/c? No  Grab bars in the bathroom? Yes  Shower chair or bench in shower? Yes  Elevated toilet seat or a handicapped toilet? No   TIMED UP AND GO:  Was the test performed? Yes .  Length of time to ambulate 10 feet: 10 sec.   Gait steady and fast without use of assistive device  Cognitive Function:    10/13/2022    9:00 AM  MMSE - Mini Mental State Exam  Orientation to time 4  Orientation to Place 5  Registration 3  Attention/ Calculation 2  Recall 3  Language- name 2 objects 2  Language- repeat 0  Language- follow 3 step command 3  Language- read & follow direction 1  Write a sentence 0  Copy design 1  Total score 24        02/07/2023    8:37 AM 01/19/2022    3:27 PM 12/31/2019    1:30 PM  6CIT Screen  What Year? 0 points 0 points 0 points  What month? 3 points 0 points 0 points  What time? 0 points 0 points 0 points  Count back from 20 0 points 0 points 0 points  Months in reverse 2 points 0 points 0 points  Repeat phrase 4 points 2 points 0 points  Total Score 9 points 2 points 0 points    Immunizations Immunization History  Administered Date(s) Administered   Fluad Quad(high Dose 65+) 08/15/2019, 09/08/2021, 08/19/2022   Influenza, High Dose Seasonal PF 08/14/2018   Influenza-Unspecified 08/19/2022   Pneumococcal Conjugate-13 07/31/2018   Pneumococcal Polysaccharide-23 08/15/2019   Tdap 07/29/2015   Zoster, Live 03/27/2016    TDAP status: Up to date  Flu Vaccine status: Up to date  Pneumococcal vaccine status: Up to date  Covid-19 vaccine status: Declined, Education has been provided  regarding the importance of this vaccine but patient still declined. Advised may receive this vaccine at local pharmacy or Health Dept.or vaccine clinic. Aware to provide a copy of the vaccination record if obtained from local pharmacy or Health Dept. Verbalized acceptance and understanding.  Qualifies for Shingles Vaccine? Yes   Zostavax completed No   Shingrix Completed?: No.    Education has been provided regarding the importance of this vaccine. Patient has been advised to call insurance company to determine out of pocket expense if they have not yet received this vaccine. Advised may also receive vaccine at local pharmacy or Health Dept. Verbalized acceptance and understanding.  Screening Tests Health Maintenance  Topic Date Due   COVID-19 Vaccine (1) 02/23/2023 (Originally 07/30/1956)   Zoster Vaccines- Shingrix (1 of 2) 05/10/2023 (Originally 07/31/1970)   Medicare Annual Wellness (AWV)  02/07/2024   DTaP/Tdap/Td (2 - Td or Tdap) 07/28/2025   COLONOSCOPY (Pts 45-59yr Insurance coverage will need to be confirmed)  09/04/2028   Pneumonia Vaccine 72 Years old  Completed   INFLUENZA VACCINE  Completed   Hepatitis C Screening  Completed   HPV VACCINES  Aged Out    Health Maintenance  There are no preventive care reminders to display for this patient.   Colorectal cancer screening: Type of screening: Colonoscopy. Completed 09/04/18. Repeat every 10 years  Lung Cancer Screening: (Low Dose CT Chest recommended if Age 267-80years, 30 pack-year currently smoking OR have quit w/in 15years.) does not qualify.     Additional Screening:  Hepatitis C Screening: does qualify; Completed 12/04/18  Vision Screening: Recommended annual ophthalmology exams for early detection of glaucoma and other disorders of the eye. Is the patient up to date with their annual eye exam?  Yes  Who is the provider or what is the name of the office in which the patient attends annual eye exams? [facility] If pt  is not established with a provider, would they like to be referred to a provider to establish care? No .   Dental Screening: Recommended annual dental exams for proper oral hygiene  Community Resource Referral / Chronic Care Management:  CRR required this visit?  No   CCM required this visit?  No      Plan:     I have personally reviewed and noted the following in the patient's chart:   Medical and social history Use of alcohol, tobacco or illicit drugs  Current medications and supplements including opioid prescriptions. Patient is not currently taking opioid prescriptions. Functional ability and status Nutritional status Physical activity Advanced directives List of other physicians Hospitalizations, surgeries, and ER visits in previous 12 months Vitals Screenings to include cognitive, depression, and falls Referrals and appointments  In addition, I have reviewed and discussed with patient certain preventive protocols, quality metrics, and best practice recommendations. A written personalized care plan for preventive services as well as general preventive health recommendations were  provided to patient.     Criselda Peaches, LPN   [id number]   Nurse Notes: None

## 2023-02-07 NOTE — Patient Instructions (Addendum)
Robert Mann , Thank you for taking time to come for your Medicare Wellness Visit. I appreciate your ongoing commitment to your health goals. Please review the following plan we discussed and let me know if I can assist you in the future.   These are the goals we discussed:  Goals       DIET - INCREASE WATER INTAKE (pt-stated)      Goal 6-8  8 oz bottles  of water per day      Stay Healthy (pt-stated)      Stay alive.        This is a list of the screening recommended for you and due dates:  Health Maintenance  Topic Date Due   COVID-19 Vaccine (1) 02/23/2023*   Zoster (Shingles) Vaccine (1 of 2) 05/10/2023*   Medicare Annual Wellness Visit  02/07/2024   DTaP/Tdap/Td vaccine (2 - Td or Tdap) 07/28/2025   Colon Cancer Screening  09/04/2028   Pneumonia Vaccine  Completed   Flu Shot  Completed   Hepatitis C Screening: USPSTF Recommendation to screen - Ages 18-79 yo.  Completed   HPV Vaccine  Aged Out  *Topic was postponed. The date shown is not the original due date.    Advanced directives: Please bring a copy of your health care power of attorney and living will to the office to be added to your chart at your convenience.   Conditions/risks identified: None  Next appointment: Follow up in one year for your annual wellness visit.   Preventive Care 41 Years and Older, Male  Preventive care refers to lifestyle choices and visits with your health care provider that can promote health and wellness. What does preventive care include? A yearly physical exam. This is also called an annual well check. Dental exams once or twice a year. Routine eye exams. Ask your health care provider how often you should have your eyes checked. Personal lifestyle choices, including: Daily care of your teeth and gums. Regular physical activity. Eating a healthy diet. Avoiding tobacco and drug use. Limiting alcohol use. Practicing safe sex. Taking low doses of aspirin every day. Taking vitamin  and mineral supplements as recommended by your health care provider. What happens during an annual well check? The services and screenings done by your health care provider during your annual well check will depend on your age, overall health, lifestyle risk factors, and family history of disease. Counseling  Your health care provider may ask you questions about your: Alcohol use. Tobacco use. Drug use. Emotional well-being. Home and relationship well-being. Sexual activity. Eating habits. History of falls. Memory and ability to understand (cognition). Work and work Statistician. Screening  You may have the following tests or measurements: Height, weight, and BMI. Blood pressure. Lipid and cholesterol levels. These may be checked every 5 years, or more frequently if you are over 43 years old. Skin check. Lung cancer screening. You may have this screening every year starting at age 61 if you have a 30-pack-year history of smoking and currently smoke or have quit within the past 15 years. Fecal occult blood test (FOBT) of the stool. You may have this test every year starting at age 38. Flexible sigmoidoscopy or colonoscopy. You may have a sigmoidoscopy every 5 years or a colonoscopy every 10 years starting at age 9. Prostate cancer screening. Recommendations will vary depending on your family history and other risks. Hepatitis C blood test. Hepatitis B blood test. Sexually transmitted disease (STD) testing. Diabetes screening. This is done  by checking your blood sugar (glucose) after you have not eaten for a while (fasting). You may have this done every 1-3 years. Abdominal aortic aneurysm (AAA) screening. You may need this if you are a current or former smoker. Osteoporosis. You may be screened starting at age 24 if you are at high risk. Talk with your health care provider about your test results, treatment options, and if necessary, the need for more tests. Vaccines  Your health care  provider may recommend certain vaccines, such as: Influenza vaccine. This is recommended every year. Tetanus, diphtheria, and acellular pertussis (Tdap, Td) vaccine. You may need a Td booster every 10 years. Zoster vaccine. You may need this after age 36. Pneumococcal 13-valent conjugate (PCV13) vaccine. One dose is recommended after age 35. Pneumococcal polysaccharide (PPSV23) vaccine. One dose is recommended after age 59. Talk to your health care provider about which screenings and vaccines you need and how often you need them. This information is not intended to replace advice given to you by your health care provider. Make sure you discuss any questions you have with your health care provider. Document Released: 12/10/2015 Document Revised: 08/02/2016 Document Reviewed: 09/14/2015 Elsevier Interactive Patient Education  2017 [place] Prevention in the Home Falls can cause injuries. They can happen to people of all ages. There are many things you can do to make your home safe and to help prevent falls. What can I do on the outside of my home? Regularly fix the edges of walkways and driveways and fix any cracks. Remove anything that might make you trip as you walk through a door, such as a raised step or threshold. Trim any bushes or trees on the path to your home. Use bright outdoor lighting. Clear any walking paths of anything that might make someone trip, such as rocks or tools. Regularly check to see if handrails are loose or broken. Make sure that both sides of any steps have handrails. Any raised decks and porches should have guardrails on the edges. Have any leaves, snow, or ice cleared regularly. Use sand or salt on walking paths during winter. Clean up any spills in your garage right away. This includes oil or grease spills. What can I do in the bathroom? Use night lights. Install grab bars by the toilet and in the tub and shower. Do not use towel bars as grab  bars. Use non-skid mats or decals in the tub or shower. If you need to sit down in the shower, use a plastic, non-slip stool. Keep the floor dry. Clean up any water that spills on the floor as soon as it happens. Remove soap buildup in the tub or shower regularly. Attach bath mats securely with double-sided non-slip rug tape. Do not have throw rugs and other things on the floor that can make you trip. What can I do in the bedroom? Use night lights. Make sure that you have a light by your bed that is easy to reach. Do not use any sheets or blankets that are too big for your bed. They should not hang down onto the floor. Have a firm chair that has side arms. You can use this for support while you get dressed. Do not have throw rugs and other things on the floor that can make you trip. What can I do in the kitchen? Clean up any spills right away. Avoid walking on wet floors. Keep items that you use a lot in easy-to-reach places. If you need to  reach something above you, use a strong step stool that has a grab bar. Keep electrical cords out of the way. Do not use floor polish or wax that makes floors slippery. If you must use wax, use non-skid floor wax. Do not have throw rugs and other things on the floor that can make you trip. What can I do with my stairs? Do not leave any items on the stairs. Make sure that there are handrails on both sides of the stairs and use them. Fix handrails that are broken or loose. Make sure that handrails are as long as the stairways. Check any carpeting to make sure that it is firmly attached to the stairs. Fix any carpet that is loose or worn. Avoid having throw rugs at the top or bottom of the stairs. If you do have throw rugs, attach them to the floor with carpet tape. Make sure that you have a light switch at the top of the stairs and the bottom of the stairs. If you do not have them, ask someone to add them for you. What else can I do to help prevent  falls? Wear shoes that: Do not have high heels. Have rubber bottoms. Are comfortable and fit you well. Are closed at the toe. Do not wear sandals. If you use a stepladder: Make sure that it is fully opened. Do not climb a closed stepladder. Make sure that both sides of the stepladder are locked into place. Ask someone to hold it for you, if possible. Clearly mark and make sure that you can see: Any grab bars or handrails. First and last steps. Where the edge of each step is. Use tools that help you move around (mobility aids) if they are needed. These include: Canes. Walkers. Scooters. Crutches. Turn on the lights when you go into a dark area. Replace any light bulbs as soon as they burn out. Set up your furniture so you have a clear path. Avoid moving your furniture around. If any of your floors are uneven, fix them. If there are any pets around you, be aware of where they are. Review your medicines with your doctor. Some medicines can make you feel dizzy. This can increase your chance of falling. Ask your doctor what other things that you can do to help prevent falls. This information is not intended to replace advice given to you by your health care provider. Make sure you discuss any questions you have with your health care provider. Document Released: 09/09/2009 Document Revised: 04/20/2016 Document Reviewed: 12/18/2014 Elsevier Interactive Patient Education  2017 [employer].

## 2023-02-14 ENCOUNTER — Encounter: Payer: Medicare HMO | Admitting: Psychology

## 2023-02-20 ENCOUNTER — Encounter: Payer: Medicare HMO | Admitting: Psychology

## 2023-02-24 IMAGING — MR MR HEAD W/O CM
11 series · 48 of 48 positions shown · non-contrast
Comparison: Report from head CT 09/21/2000 (images unavailable).

CLINICAL DATA: Aphasia.  Memory loss.

EXAM:
MRI HEAD WITHOUT CONTRAST
TECHNIQUE: Multiplanar, multiecho pulse sequences of the brain and surrounding
structures were obtained without intravenous contrast.

[Series 2: T1 · sagittal · 5.0mm · 0.45mm/px · 1 of 21 slices shown]
[im 1/21]
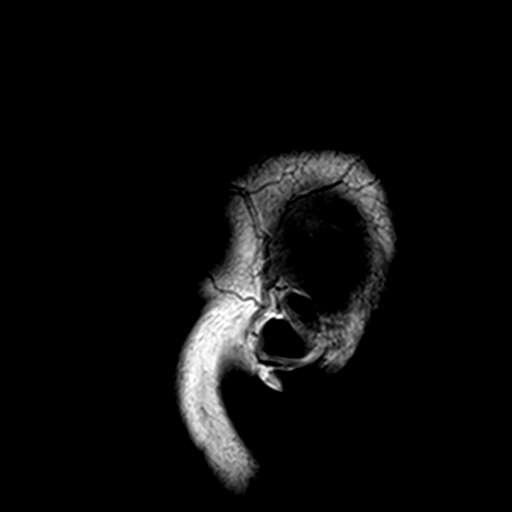

[Series 3: DWI · axial · 3.0mm · 1.80mm/px · z∈[-67,+89]mm · 8 of 106 slices shown (1 of 4)]
[im 1/106]
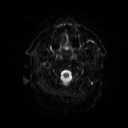
[im 16/106]
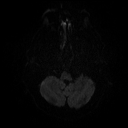
[im 31/106]
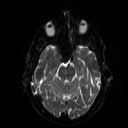
[im 46/106]
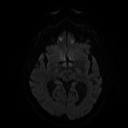
[im 61/106]
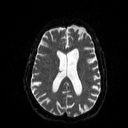
[im 76/106]
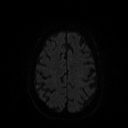
[im 91/106]
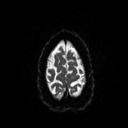
[im 106/106]
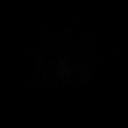

[Series 4: DWI · axial · 3.0mm · 1.80mm/px · z∈[-67,+89]mm · 4 of 53 slices shown (2 of 4)]
[im 1/53]
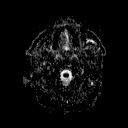
[im 18/53]
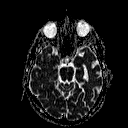
[im 35/53]
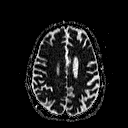
[im 53/53]
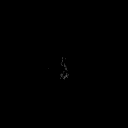

[Series 5: DWI · coronal · 5.0mm · 1.80mm/px · 6 of 74 slices shown (3 of 4)]
[im 1/74]
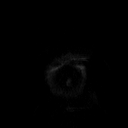
[im 15/74]
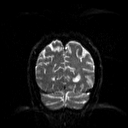
[im 30/74]
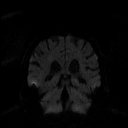
[im 44/74]
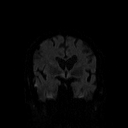
[im 59/74]
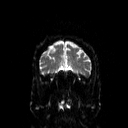
[im 74/74]
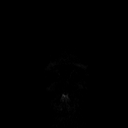

[Series 6: DWI · coronal · 5.0mm · 1.80mm/px · 3 of 37 slices shown (4 of 4)]
[im 1/37]
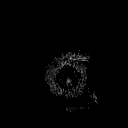
[im 19/37]
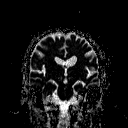
[im 37/37]
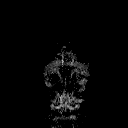

[Series 7: T2 · axial · 5.0mm · 0.53mm/px · z∈[-78,+83]mm · 2 of 24 slices shown (1 of 2)]
[im 1/24]
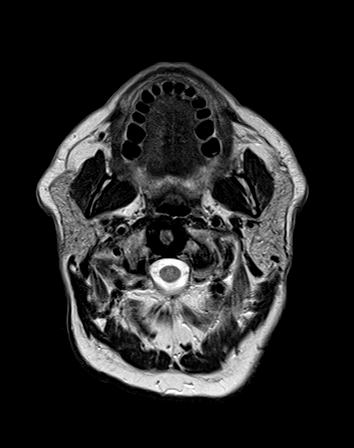
[im 24/24]
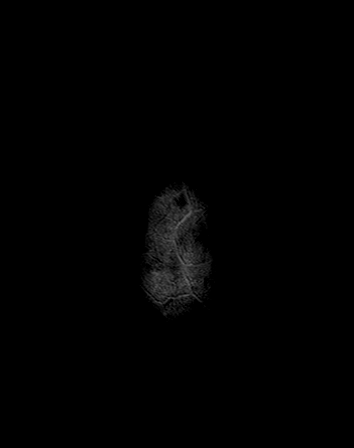

[Series 8: FLAIR · axial · 3.0mm · 0.46mm/px · z∈[-60,+89]mm · 3 of 33 slices shown]
[im 1/33]
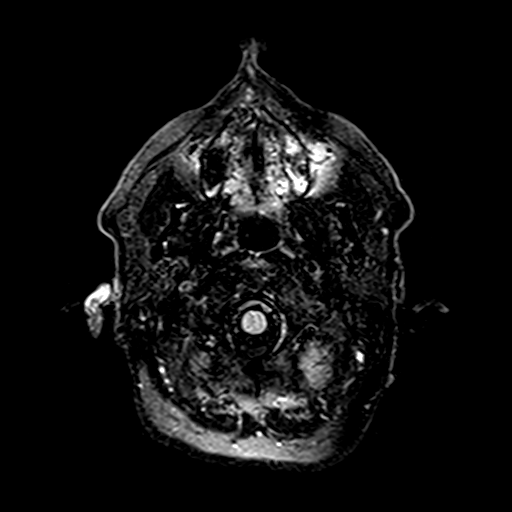
[im 17/33]
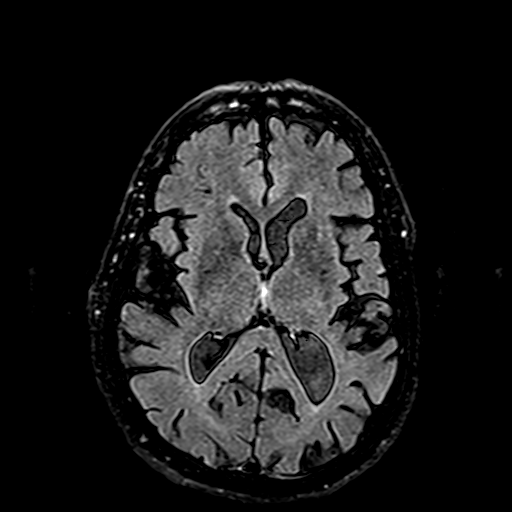
[im 33/33]
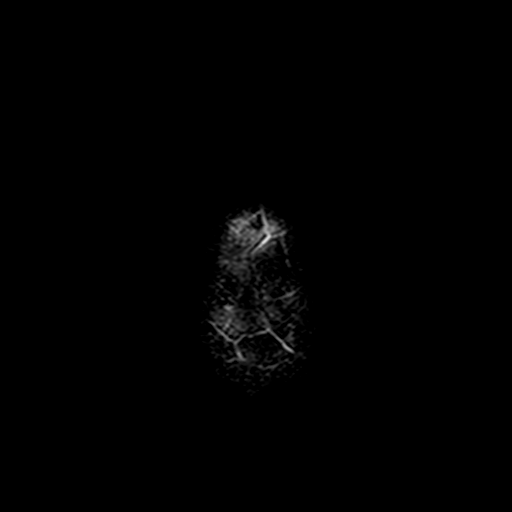

[Series 9: mip_images(sw) · axial · 32.0mm · 0.90mm/px · z∈[-55,+73]mm · 3 of 33 slices shown]
[im 1/33]
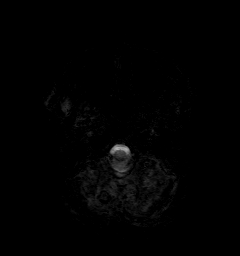
[im 17/33]
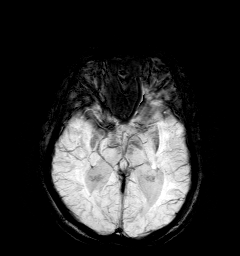
[im 33/33]
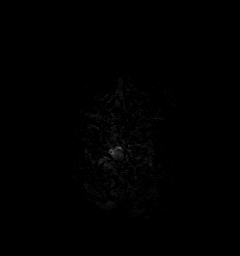

[Series 10: swi_images · axial · 4.0mm · 0.90mm/px · z∈[-69,+87]mm · 3 of 40 slices shown]
[im 1/40]
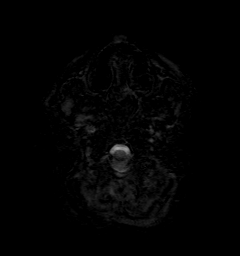
[im 20/40]
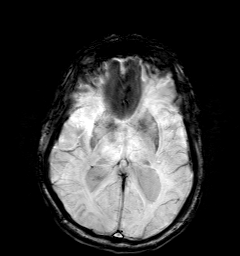
[im 40/40]
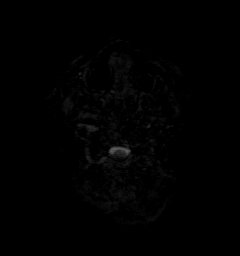

[Series 11: t1_mpr_tra · axial · 1.0mm · 0.71mm/px · z∈[-63,+96]mm · 13 of 160 slices shown]
[im 1/160]
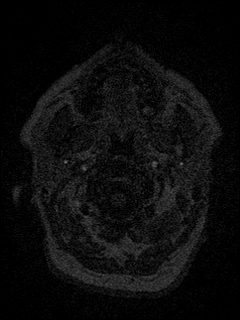
[im 14/160]
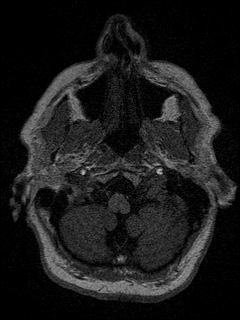
[im 27/160]
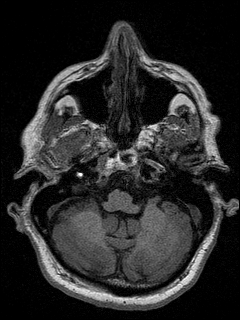
[im 40/160]
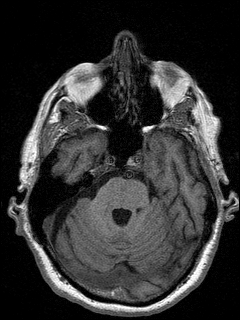
[im 54/160]
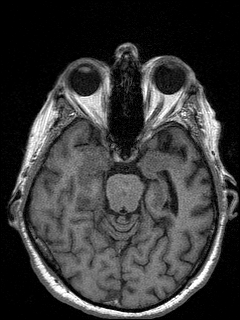
[im 67/160]
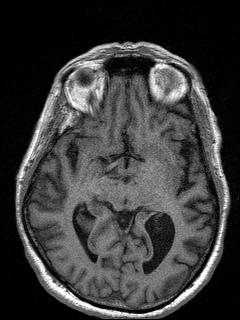
[im 80/160]
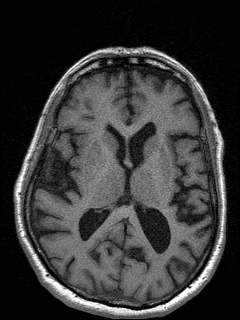
[im 93/160]
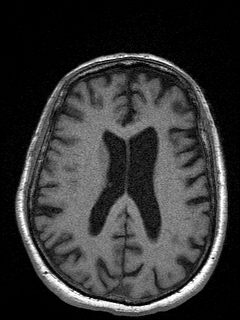
[im 107/160]
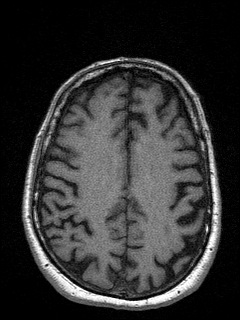
[im 120/160]
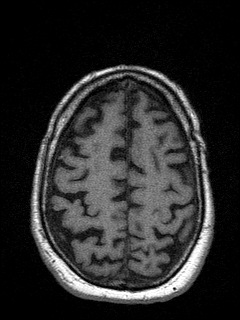
[im 133/160]
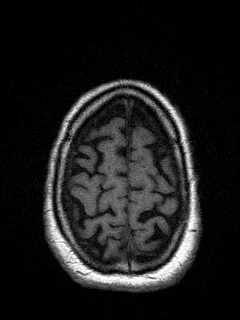
[im 146/160]
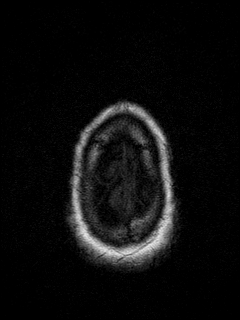
[im 160/160]
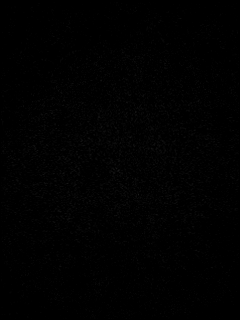

[Series 12: T2 · coronal · 5.0mm · 0.45mm/px · 2 of 29 slices shown (2 of 2)]
[im 1/29]
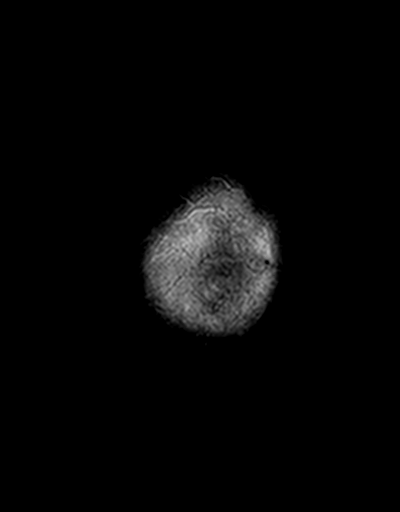
[im 29/29]
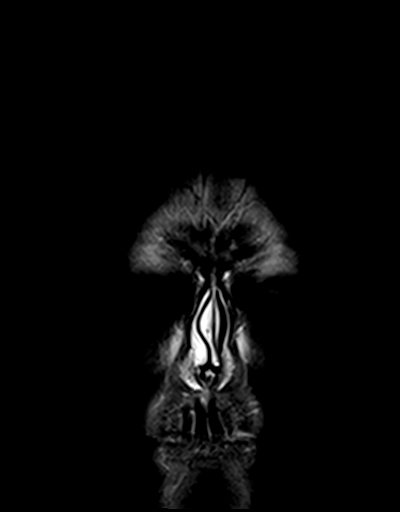

[48 of 48 positions shown; findings below may reference images not displayed]

FINDINGS: Brain:

Moderate generalized cerebral atrophy. Comparatively mild cerebellar
atrophy.

Prominent perivascular space versus chronic lacunar infarct within
the central pons.

No cortical encephalomalacia is identified. No significant cerebral
white matter disease.

There is no acute infarct.

No evidence of an intracranial mass.

No chronic intracranial blood products.

No extra-axial fluid collection.

No midline shift.

Vascular: Maintained flow voids within the proximal large arterial
vessels.

Skull and upper cervical spine: No focal suspicious marrow lesion.

Sinuses/Orbits: Visualized orbits show no acute finding. Small left
maxillary sinus mucous retention cyst.
IMPRESSION: No evidence of acute intracranial abnormality.

Prominent perivascular space versus chronic lacunar infarct within
the central pons.

Moderate generalized cerebral atrophy with comparatively mild
cerebellar atrophy.

Otherwise unremarkable MRI appearance of the brain for age.

Small left maxillary sinus mucous retention cyst.

## 2023-04-16 ENCOUNTER — Telehealth: Payer: Self-pay | Admitting: Adult Health

## 2023-04-16 NOTE — Telephone Encounter (Addendum)
Pt's spouse called to say that although she knows NP is not taking new patients at this time, she wanted to ask if NP would be willing to make an exception for her, since her husband is already a patient?  Robert Mann  DOB  03/18/1947  Please advise.

## 2023-04-17 ENCOUNTER — Other Ambulatory Visit: Payer: Self-pay | Admitting: Physician Assistant

## 2023-04-17 NOTE — Telephone Encounter (Signed)
Please advise 

## 2023-04-17 NOTE — Telephone Encounter (Signed)
Patient notified of update  and verbalized understanding. 

## 2023-04-17 NOTE — Telephone Encounter (Signed)
Appt is upcoming, no further refills until seen

## 2023-04-25 ENCOUNTER — Ambulatory Visit: Payer: Medicare HMO | Admitting: Physician Assistant

## 2023-04-25 ENCOUNTER — Encounter: Payer: Self-pay | Admitting: Physician Assistant

## 2023-04-25 VITALS — BP 156/94 | HR 74 | Resp 18 | Ht 70.0 in | Wt 173.0 lb

## 2023-04-25 DIAGNOSIS — G3101 Pick's disease: Secondary | ICD-10-CM

## 2023-04-25 DIAGNOSIS — F0280 Dementia in other diseases classified elsewhere without behavioral disturbance: Secondary | ICD-10-CM

## 2023-04-25 DIAGNOSIS — G3184 Mild cognitive impairment, so stated: Secondary | ICD-10-CM

## 2023-04-25 MED ORDER — MEMANTINE HCL 5 MG PO TABS: ORAL_TABLET | ORAL | 11 refills | Status: DC

## 2023-04-25 MED ORDER — DONEPEZIL HCL 10 MG PO TABS: ORAL_TABLET | ORAL | 11 refills | Status: DC

## 2023-04-25 NOTE — Patient Instructions (Addendum)
It was a pleasure to see you today at our office.   Recommendations:  Meds: Follow up in 6  months Continue donepezil 10 mg daily. Side effects were discussed  Start Memantine 5 mg tablets.  Take 1 tablet at bedtime for 2 weeks, then 1 tablet twice daily.         RECOMMENDATIONS FOR ALL PATIENTS WITH MEMORY PROBLEMS: 1. Continue to exercise (Recommend 30 minutes of walking everyday, or 3 hours every week) 2. Increase social interactions - continue going to [place] and enjoy social gatherings with friends and family 3. Eat healthy, avoid fried foods and eat more fruits and vegetables 4. Maintain adequate blood pressure, blood sugar, and blood cholesterol level. Reducing the risk of stroke and cardiovascular disease also helps promoting better memory. 5. Avoid stressful situations. Live a simple life and avoid aggravations. Organize your time and prepare for the next day in anticipation. 6. Sleep well, avoid any interruptions of sleep and avoid any distractions in the bedroom that may interfere with adequate sleep quality 7. Avoid sugar, avoid sweets as there is a strong link between excessive sugar intake, diabetes, and cognitive impairment We discussed the Mediterranean diet, which has been shown to help patients reduce the risk of progressive memory disorders and reduces cardiovascular risk. This includes eating fish, eat fruits and green leafy vegetables, nuts like almonds and hazelnuts, walnuts, and also use olive oil. Avoid fast foods and fried foods as much as possible. Avoid sweets and sugar as sugar use has been linked to worsening of memory function.  There is always a concern of gradual progression of memory problems. If this is the case, then we may need to adjust level of care according to patient needs. Support, both to the patient and caregiver, should then be put into place.       FALL PRECAUTIONS: Be cautious when walking. Scan the area for obstacles that may increase the  risk of trips and falls. When getting up in the mornings, sit up at the edge of the bed for a few minutes before getting out of bed. Consider elevating the bed at the head end to avoid drop of blood pressure when getting up. Walk always in a well-lit room (use night lights in the walls). Avoid area rugs or power cords from appliances in the middle of the walkways. Use a walker or a cane if necessary and consider physical therapy for balance exercise. Get your eyesight checked regularly.  FINANCIAL OVERSIGHT: Supervision, especially oversight when making financial decisions or transactions is also recommended.  HOME SAFETY: Consider the safety of the kitchen when operating appliances like stoves, microwave oven, and blender. Consider having supervision and share cooking responsibilities until no longer able to participate in those. Accidents with firearms and other hazards in the house should be identified and addressed as well.   ABILITY TO BE LEFT ALONE: If patient is unable to contact 911 operator, consider using LifeLine, or when the need is there, arrange for someone to stay with patients. Smoking is a fire hazard, consider supervision or cessation. Risk of wandering should be assessed by caregiver and if detected at any point, supervision and safe proof recommendations should be instituted.  MEDICATION SUPERVISION: Inability to self-administer medication needs to be constantly addressed. Implement a mechanism to ensure safe administration of the medications.   DRIVING: Regarding driving, in patients with progressive memory problems, driving will be impaired. We advise to have someone else do the driving if trouble finding directions or if  minor accidents are reported. Independent driving assessment is available to determine safety of driving.   If you are interested in the driving assessment, you can contact the following:  [employer] [facility] in [place] [phone]  [facility]  Rehabilitative [facility] [phone]  [facility] [phone] or [phone]      Mediterranean Diet A Mediterranean diet refers to food and lifestyle choices that are based on the traditions of countries located on the [place]. This way of eating has been shown to help prevent certain conditions and improve outcomes for people who have chronic diseases, like kidney disease and heart disease. What are tips for following this plan? Lifestyle  Cook and eat meals together with your family, when possible. Drink enough fluid to keep your urine clear or pale yellow. Be physically active every day. This includes: Aerobic exercise like running or swimming. Leisure activities like gardening, walking, or housework. Get 7-8 hours of sleep each night. If recommended by your health care provider, drink red wine in moderation. This means 1 glass a day for nonpregnant women and 2 glasses a day for men. A glass of wine equals 5 oz (150 mL). Reading food labels  Check the serving size of packaged foods. For foods such as rice and pasta, the serving size refers to the amount of cooked product, not dry. Check the total fat in packaged foods. Avoid foods that have saturated fat or trans fats. Check the ingredients list for added sugars, such as corn syrup. Shopping  At the grocery store, buy most of your food from the areas near the walls of the store. This includes: Fresh fruits and vegetables (produce). Grains, beans, nuts, and seeds. Some of these may be available in unpackaged forms or large amounts (in bulk). Fresh seafood. Poultry and eggs. Low-fat dairy products. Buy whole ingredients instead of prepackaged foods. Buy fresh fruits and vegetables in-season from local farmers markets. Buy frozen fruits and vegetables in resealable bags. If you do not have access to quality fresh seafood, buy precooked frozen shrimp or canned fish, such as tuna,  salmon, or sardines. Buy small amounts of raw or cooked vegetables, salads, or olives from the deli or salad bar at your store. Stock your pantry so you always have certain foods on hand, such as olive oil, canned tuna, canned tomatoes, rice, pasta, and beans. Cooking  Cook foods with extra-virgin olive oil instead of using butter or other vegetable oils. Have meat as a side dish, and have vegetables or grains as your main dish. This means having meat in small portions or adding small amounts of meat to foods like pasta or stew. Use beans or vegetables instead of meat in common dishes like chili or lasagna. Experiment with different cooking methods. Try roasting or broiling vegetables instead of steaming or sauteing them. Add frozen vegetables to soups, stews, pasta, or rice. Add nuts or seeds for added healthy fat at each meal. You can add these to yogurt, salads, or vegetable dishes. Marinate fish or vegetables using olive oil, lemon juice, garlic, and fresh herbs. Meal planning  Plan to eat 1 vegetarian meal one day each week. Try to work up to 2 vegetarian meals, if possible. Eat seafood 2 or more times a week. Have healthy snacks readily available, such as: Vegetable sticks with hummus. Greek yogurt. Fruit and nut trail mix. Eat balanced meals throughout the week. This includes: Fruit: 2-3 servings a day Vegetables: 4-5 servings a day Low-fat dairy: 2  servings a day Fish, poultry, or lean meat: 1 serving a day Beans and legumes: 2 or more servings a week Nuts and seeds: 1-2 servings a day Whole grains: 6-8 servings a day Extra-virgin olive oil: 3-4 servings a day Limit red meat and sweets to only a few servings a month What are my food choices? Mediterranean diet Recommended Grains: Whole-grain pasta. Brown rice. Bulgar wheat. Polenta. Couscous. Whole-wheat bread. Orpah Cobb. Vegetables: Artichokes. Beets. Broccoli. Cabbage. Carrots. Eggplant. Green beans. Chard. Kale.  Spinach. Onions. Leeks. Peas. Squash. Tomatoes. Peppers. Radishes. Fruits: Apples. Apricots. Avocado. Berries. Bananas. Cherries. Dates. Figs. Grapes. Lemons. Melon. Oranges. Peaches. Plums. Pomegranate. Meats and other protein foods: Beans. Almonds. Sunflower seeds. Pine nuts. Peanuts. Cod. Salmon. Scallops. Shrimp. Tuna. Tilapia. Clams. Oysters. Eggs. Dairy: Low-fat milk. Cheese. Greek yogurt. Beverages: Water. Red wine. Herbal tea. Fats and oils: Extra virgin olive oil. Avocado oil. Grape seed oil. Sweets and desserts: [place] yogurt with honey. Baked apples. Poached pears. Trail mix. Seasoning and other foods: Basil. Cilantro. Coriander. Cumin. Mint. Parsley. Sage. Rosemary. Tarragon. Garlic. Oregano. Thyme. Pepper. Balsalmic vinegar. Tahini. Hummus. Tomato sauce. Olives. Mushrooms. Limit these Grains: Prepackaged pasta or rice dishes. Prepackaged cereal with added sugar. Vegetables: Deep fried potatoes (french fries). Fruits: Fruit canned in syrup. Meats and other protein foods: Beef. Pork. Lamb. Poultry with skin. Hot dogs. Tomasa Blase. Dairy: Ice cream. Sour cream. Whole milk. Beverages: Juice. Sugar-sweetened soft drinks. Beer. Liquor and spirits. Fats and oils: Butter. Canola oil. Vegetable oil. Beef fat (tallow). Lard. Sweets and desserts: Cookies. Cakes. Pies. Candy. Seasoning and other foods: Mayonnaise. Premade sauces and marinades. The items listed may not be a complete list. Talk with your dietitian about what dietary choices are right for you. Summary The Mediterranean diet includes both food and lifestyle choices. Eat a variety of fresh fruits and vegetables, beans, nuts, seeds, and whole grains. Limit the amount of red meat and sweets that you eat. Talk with your health care provider about whether it is safe for you to drink red wine in moderation. This means 1 glass a day for nonpregnant women and 2 glasses a day for men. A glass of wine equals 5 oz (150 mL). This information is  not intended to replace advice given to you by your health care provider. Make sure you discuss any questions you have with your health care provider. Document Released: 07/06/2016 Document Revised: 08/08/2016 Document Reviewed: 07/06/2016 Elsevier Interactive Patient Education  2017 [employer].

## 2023-04-25 NOTE — Progress Notes (Signed)
Assessment/Plan:   Mild Neurocognitive Disorder, Severe Primary Progressive Aphasia  Robert Mann is a very pleasant 72 y.o. RH male  with a history of hypertension, hyperlipidemia, h/o melanoma L chest and L shoulder and a diagnosis of severe MCI and PPA per Neuropsych evaluation seen today in follow up for memory loss. Patient is currently on donepezil 10 mg daily . 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  The patient does demonstrate difficulties with speech, and has used ST in the past, but he politely declines at this time resuming it.  MMSE today is 19/30, however, he had significant difficulty in repeating phrases, writing a sentence or copying a figure.  Patient may be in the transition.  Between MCI and mild dementia.  However, it would be very difficult for him to undergo a repeat Neuropsych evaluation at this time and he is not very interested in proceeding with that test.  He still able to participate in most ADLs, and drive without difficulty.      Follow up in  6 months. Repeat Neuropsych  Continue donepezil 10 mg daily. Side effects were discussed  Start Memantine 5 mg twice daily. Side effects were discussed  Recommend good control of her cardiovascular risk factors Continue to control mood as per PCP    Subjective:    This patient is here alone. Previous records as well as any outside records available were reviewed prior to todays visit. Patient was last seen on 10/13/22 with MMSE 24/30    Any changes in memory since last visit?  Patient feels that his memory and speech are slightly better however he is wife send some information to [place] stating that "has some difficulty finding names, speaking in stressful situations such as in public, and his conversation often contains pulses.  He also has difficulty with numerical task, and  spelling errors.  Multitasking is more difficult.  At times he has trouble finding objects.  He also has difficulty with assembling objects or devices.  "He has used speech therapy in the past, which was beneficial, but he declines any more at this time.  Uses gestures to increase communication in conversation and a writing pad. His wife designs tasks for him to do and they go over it together, he reads "some", does yard work, trying to cut some trees down.  "I want to go on a cruise at some point ". Repeats oneself?  Endorsed Disoriented when walking into a room?  Patient denies.  Leaving objects in unusual places?    Denies. "I always have the phone with me".  As mentioned above, he may have difficulty finding them for example in the cupboard.  Wandering behavior?  denies   Any personality changes since last visit?  denies   Any worsening depression?: Tries to remain positive   Hallucinations or paranoia?  denies   Seizures?    denies    Any sleep changes?  Sleeps well. Denies vivid dreams, REM behavior or sleepwalking   Sleep apnea?   denies   Any hygiene concerns? denies   Independent of bathing and dressing?  Endorsed  Does the patient needs help with medications?  Patient is in charge   Who is in charge of the finances?  Patient is in charge     Any changes in appetite? Eating well.   Patient have trouble swallowing?  denies  Does the patient cook?  Any kitchen accidents such as leaving the stove on? Patient denies   Any headaches?   denies   Ambulates with difficulty? He walks [id number] steps daily.   Recent falls or head injuries? denies     Unilateral weakness, numbness or tingling? denies   Any tremors?  denies   Any anosmia?  Patient denies   Any incontinence of urine?  denies   Any bowel dysfunction?     denies      Patient lives with his wife    Does the patient drive? Yes, denies any difficulties   Labs 09/08/21 CBC, CMP normal TC 227, TG 153, LDL 152  TSH 1.79 B12 >2000  04/2021   MRI brain wo contrast 08/18/21 No evidence of acute intracranial abnormality. Prominent perivascular space versus chronic lacunar infarct within the central pons. Moderate generalized cerebral atrophy with comparatively mild cerebellar atrophy. Otherwise unremarkable MRI appearance of the brain for age. Small left maxillary sinus mucous retention cyst.     Neuropsychological evaluation on 09/02/2021 Dr. Milbert Coulter "Briefly, results suggested diffuse cognitive impairment relative to age-matched peers. He did exhibit appropriate performances across visuospatial abilities and confrontation naming. However, all other assessed domains exhibited severe cognitive impairment. 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 This neurodegenerative disease process is characterized by pronounced impairment with language and executive dysfunction when in earlier stages. While Mr. Ketcherside showed relatively diffuse impairment, these areas arguably exhibited greatest impairment. In particular, the non-fluent PPA subtype is characterized by the evolution of significant word finding difficulties and halting, effortful speech. These symptoms were very prevalent during the current evaluation, as well as reported by Mr. Cardiel and his wife as being present and worsening over the past 1-2 years. Specific to Alzheimer's disease, Mr. Guy did show significant memory impairment across testing. However, it is worth highlighting that he exhibited appropriate retention scores across 3/4 memory tasks. This would suggest that greater memory difficulty is across encoding (i.e., learning) and retrieval aspects of memory, likely worsened by profound deficits in receptive and expressive language. Strong retention scores would not suggest a primary memory storage deficit, which would be inconsistent with typically presenting Alzheimer's disease"  When meeting with  his neurologist, I would recommend that they discuss neurological medications which may slow decline. It is important to highlight that these medications may slow functional decline in some individuals. Unfortunately, there is no current treatment which is able to stop or reverse these changes. To aid with further diagnostic clarity, they could also discuss the pros and cons of an FDG-PET scan as this can yield helpful information in cases such as this.       Initial Visit  09/15/21 The patient is seen in neurologic consultation at the request of [name], Kandee Keen, NP for the evaluation of memory loss in the setting of Primary Progressive Aphasia.  The patient is here alone.  This is a 72 year old right-handed male who had memory issues for about 2 years.  He describes it as "I have the words in my head, but can get them out ".  This has been worse over the last 6 months.  He lives with his wife who has also noticed the same changes.  His mood is good, but he is also very cautious about talking to people, which has made him more withdrawn, and reviewed that he is afraid that people will notice.  He denies depression or irritability.  He likes to read, but he  has to sometimes read the same article several times until "getting it ".  During the visit, he has shown some frustration in trying to find the right word.  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  He noted yes the porch.   He sleeps well, denies vivid dreams or sleepwalking, hallucinations or paranoia.  He denies leaving objects in unusual places, but cannot remember at times where he left him.  He is independent of bathing and dressing, no issues with hygiene.  He does not forget to take his medications, he is in charge of his finances, denies missing any bills.  His appetite is good, denies any trouble swallowing, or sialorrhea.  He has a  history of reflux, that at times causes him to have some throat discomfort.  His wife does the cooking.  He ambulates about 10 miles a day without difficulties, denies any falls.  Over the years he had several minor injuries in the head, stating that "I build stuff and hit my head several times ".  He continues to drive without getting lost.  He denies any headaches, double vision, dizziness, focal numbness or tingling, unilateral weakness, tremors or anosmia.  No history of seizures.  Denies urine incontinence, retention constipation or diarrhea.  He denies a history of sleep apnea, but has he does say that at times he notices snoring.  He denies a history of alcohol or tobacco.  Family history negative for dementia. PREVIOUS MEDICATIONS:   CURRENT MEDICATIONS:  Outpatient Encounter Medications as of 04/25/2023  Medication Sig   amLODipine (NORVASC) 5 MG tablet Take 1 tablet (5 mg total) by mouth daily. SCHEDULE APPT FOR ANY FUTURE REFILLSTAKE 1 TABLET (5 MG TOTAL) BY MOUTH DAILY.   Blood Pressure Monitoring (BLOOD PRESSURE MONITOR/M CUFF) MISC One cuff for management of hypertension   fexofenadine (ALLEGRA ODT) 30 MG disintegrating tablet Take 30 mg by mouth daily.   losartan (COZAAR) 25 MG tablet Take 1 tablet (25 mg total) by mouth daily.   memantine (NAMENDA) 5 MG tablet Take 1 tablet (5 mg at night) for 2 weeks, then increase to 1 tablet (5 mg) twice a day   Multiple Vitamins-Minerals (CENTRUM SILVER PO) Take 1 tablet by mouth daily.   naproxen sodium (ALEVE) 220 MG tablet Take 220 mg by mouth as needed.   trimethoprim-polymyxin b (POLYTRIM) ophthalmic solution Place 2 drops into both eyes every 4 (four) hours.   [DISCONTINUED] donepezil (ARICEPT) 10 MG tablet TAKE 1 TABLET BY MOUTH AT NIGHT   donepezil (ARICEPT) 10 MG tablet TAKE 1 TABLET BY MOUTH AT NIGHT   Facility-Administered Encounter Medications as of 04/25/2023  Medication   0.9 %  sodium chloride infusion       04/25/2023   12:00  PM 10/13/2022    9:00 AM  MMSE - Mini Mental State Exam  Orientation to time 4 4  Orientation to Place 5 5  Registration 2 3  Attention/ Calculation 0 2  Recall 2 3  Language- name 2 objects 2 2  Language- repeat 0 0  Language- follow 3 step command 3 3  Language- read & follow direction 1 1  Write a sentence 0 0  Copy design 0 1  Total score 19 24       No data to display          Objective:     PHYSICAL EXAMINATION:  VITALS:   Vitals:   04/25/23 0852  BP: (!) 156/94  Pulse: 74  Resp: 18  SpO2: 98%  Weight: 173 lb (78.5 kg)  Height: 5\' 10"  (1.778 m)    GEN:  The patient appears stated age and is in NAD. HEENT:  Normocephalic, atraumatic.   Neurological examination:  General: NAD, well-groomed, appears stated age. Orientation: The patient is alert. Oriented to person, place and date Cranial nerves: There is good facial symmetry.The speech is non fluent but clear. No aphasia or dysarthria. Fund of knowledge is appropriate. Recent and remote memory are impaired. Attention and concentration are reduced.  Able to name objects and unable to repeat phrases.  Hearing is intact to conversational tone.   Sensation: Sensation is intact to light touch throughout Motor: Strength is at least antigravity x4. DTR's 2/4 in UE/LE     Movement examination: Tone: There is normal tone in the UE/LE Abnormal movements:  no tremor.  No myoclonus.  No asterixis.   Coordination:  There is no decremation with RAM's. Normal finger to nose  Gait and Station: The patient has no difficulty arising out of a deep-seated chair without the use of the hands. The patient's stride length is good.  Gait is cautious and narrow.    Thank you for allowing [place] the opportunity to participate in the care of this nice patient. Please do not hesitate to contact us for any questions or concerns.   Total time spent on today's visit was 33 minutes dedicated to this patient today, preparing to see  patient, examining the patient, ordering tests and/or medications and counseling the patient, documenting clinical information in the EHR or other health record, independently interpreting results and communicating results to the patient/family, discussing treatment and goals, answering patient's questions and coordinating care.  Cc:  Shirline Frees, NP  Marlowe Kays 04/25/2023 12:38 PM

## 2023-06-13 DIAGNOSIS — D2271 Melanocytic nevi of right lower limb, including hip: Secondary | ICD-10-CM | POA: Diagnosis not present

## 2023-06-13 DIAGNOSIS — L821 Other seborrheic keratosis: Secondary | ICD-10-CM | POA: Diagnosis not present

## 2023-06-13 DIAGNOSIS — Z85820 Personal history of malignant melanoma of skin: Secondary | ICD-10-CM | POA: Diagnosis not present

## 2023-06-13 DIAGNOSIS — D485 Neoplasm of uncertain behavior of skin: Secondary | ICD-10-CM | POA: Diagnosis not present

## 2023-06-13 DIAGNOSIS — D225 Melanocytic nevi of trunk: Secondary | ICD-10-CM | POA: Diagnosis not present

## 2023-06-13 DIAGNOSIS — D2272 Melanocytic nevi of left lower limb, including hip: Secondary | ICD-10-CM | POA: Diagnosis not present

## 2023-06-13 DIAGNOSIS — C4442 Squamous cell carcinoma of skin of scalp and neck: Secondary | ICD-10-CM | POA: Diagnosis not present

## 2023-10-03 ENCOUNTER — Ambulatory Visit: Payer: Medicare HMO | Admitting: Physician Assistant

## 2023-10-03 ENCOUNTER — Encounter: Payer: Self-pay | Admitting: Physician Assistant

## 2023-10-03 VITALS — BP 147/95 | HR 74 | Resp 20 | Ht 70.0 in | Wt 170.0 lb

## 2023-10-03 DIAGNOSIS — G3101 Pick's disease: Secondary | ICD-10-CM

## 2023-10-03 DIAGNOSIS — G3184 Mild cognitive impairment, so stated: Secondary | ICD-10-CM

## 2023-10-03 DIAGNOSIS — F0280 Dementia in other diseases classified elsewhere without behavioral disturbance: Secondary | ICD-10-CM

## 2023-10-03 MED ORDER — MEMANTINE HCL 10 MG PO TABS: ORAL_TABLET | ORAL | 11 refills | Status: DC

## 2023-10-03 MED ORDER — DONEPEZIL HCL 10 MG PO TABS: ORAL_TABLET | ORAL | 3 refills | Status: DC

## 2023-10-03 NOTE — Patient Instructions (Addendum)
It was a pleasure to see you today at our office.   Recommendations:  Meds: Follow up in 6  months Continue donepezil 10 mg nightly  Start Memantine 10 mg tablets take  1 tablet twice daily.         RECOMMENDATIONS FOR ALL PATIENTS WITH MEMORY PROBLEMS: 1. Continue to exercise (Recommend 30 minutes of walking everyday, or 3 hours every week) 2. Increase social interactions - continue going to [place] and enjoy social gatherings with friends and family 3. Eat healthy, avoid fried foods and eat more fruits and vegetables 4. Maintain adequate blood pressure, blood sugar, and blood cholesterol level. Reducing the risk of stroke and cardiovascular disease also helps promoting better memory. 5. Avoid stressful situations. Live a simple life and avoid aggravations. Organize your time and prepare for the next day in anticipation. 6. Sleep well, avoid any interruptions of sleep and avoid any distractions in the bedroom that may interfere with adequate sleep quality 7. Avoid sugar, avoid sweets as there is a strong link between excessive sugar intake, diabetes, and cognitive impairment We discussed the Mediterranean diet, which has been shown to help patients reduce the risk of progressive memory disorders and reduces cardiovascular risk. This includes eating fish, eat fruits and green leafy vegetables, nuts like almonds and hazelnuts, walnuts, and also use olive oil. Avoid fast foods and fried foods as much as possible. Avoid sweets and sugar as sugar use has been linked to worsening of memory function.  There is always a concern of gradual progression of memory problems. If this is the case, then we may need to adjust level of care according to patient needs. Support, both to the patient and caregiver, should then be put into place.       FALL PRECAUTIONS: Be cautious when walking. Scan the area for obstacles that may increase the risk of trips and falls. When getting up in the mornings, sit up at  the edge of the bed for a few minutes before getting out of bed. Consider elevating the bed at the head end to avoid drop of blood pressure when getting up. Walk always in a well-lit room (use night lights in the walls). Avoid area rugs or power cords from appliances in the middle of the walkways. Use a walker or a cane if necessary and consider physical therapy for balance exercise. Get your eyesight checked regularly.  FINANCIAL OVERSIGHT: Supervision, especially oversight when making financial decisions or transactions is also recommended.  HOME SAFETY: Consider the safety of the kitchen when operating appliances like stoves, microwave oven, and blender. Consider having supervision and share cooking responsibilities until no longer able to participate in those. Accidents with firearms and other hazards in the house should be identified and addressed as well.   ABILITY TO BE LEFT ALONE: If patient is unable to contact 911 operator, consider using LifeLine, or when the need is there, arrange for someone to stay with patients. Smoking is a fire hazard, consider supervision or cessation. Risk of wandering should be assessed by caregiver and if detected at any point, supervision and safe proof recommendations should be instituted.  MEDICATION SUPERVISION: Inability to self-administer medication needs to be constantly addressed. Implement a mechanism to ensure safe administration of the medications.   DRIVING: Regarding driving, in patients with progressive memory problems, driving will be impaired. We advise to have someone else do the driving if trouble finding directions or if minor accidents are reported. Independent driving assessment is available to determine safety  of driving.   If you are interested in the driving assessment, you can contact the following:  [employer] in [place] [phone]  [facility] [phone]  [facility]  [phone] or [phone]      Mediterranean Diet A Mediterranean diet refers to food and lifestyle choices that are based on the traditions of countries located on the [employer]. This way of eating has been shown to help prevent certain conditions and improve outcomes for people who have chronic diseases, like kidney disease and heart disease. What are tips for following this plan? Lifestyle  Cook and eat meals together with your family, when possible. Drink enough fluid to keep your urine clear or pale yellow. Be physically active every day. This includes: Aerobic exercise like running or swimming. Leisure activities like gardening, walking, or housework. Get 7-8 hours of sleep each night. If recommended by your health care provider, drink red wine in moderation. This means 1 glass a day for nonpregnant women and 2 glasses a day for men. A glass of wine equals 5 oz (150 mL). Reading food labels  Check the serving size of packaged foods. For foods such as rice and pasta, the serving size refers to the amount of cooked product, not dry. Check the total fat in packaged foods. Avoid foods that have saturated fat or trans fats. Check the ingredients list for added sugars, such as corn syrup. Shopping  At the grocery store, buy most of your food from the areas near the walls of the store. This includes: Fresh fruits and vegetables (produce). Grains, beans, nuts, and seeds. Some of these may be available in unpackaged forms or large amounts (in bulk). Fresh seafood. Poultry and eggs. Low-fat dairy products. Buy whole ingredients instead of prepackaged foods. Buy fresh fruits and vegetables in-season from local farmers markets. Buy frozen fruits and vegetables in resealable bags. If you do not have access to quality fresh seafood, buy precooked frozen shrimp or canned fish, such as tuna, salmon, or sardines. Buy small amounts of raw or cooked  vegetables, salads, or olives from the deli or salad bar at your store. Stock your pantry so you always have certain foods on hand, such as olive oil, canned tuna, canned tomatoes, rice, pasta, and beans. Cooking  Cook foods with extra-virgin olive oil instead of using butter or other vegetable oils. Have meat as a side dish, and have vegetables or grains as your main dish. This means having meat in small portions or adding small amounts of meat to foods like pasta or stew. Use beans or vegetables instead of meat in common dishes like chili or lasagna. Experiment with different cooking methods. Try roasting or broiling vegetables instead of steaming or sauteing them. Add frozen vegetables to soups, stews, pasta, or rice. Add nuts or seeds for added healthy fat at each meal. You can add these to yogurt, salads, or vegetable dishes. Marinate fish or vegetables using olive oil, lemon juice, garlic, and fresh herbs. Meal planning  Plan to eat 1 vegetarian meal one day each week. Try to work up to 2 vegetarian meals, if possible. Eat seafood 2 or more times a week. Have healthy snacks readily available, such as: Vegetable sticks with hummus. Greek yogurt. Fruit and nut trail mix. Eat balanced meals throughout the week. This includes: Fruit: 2-3 servings a day Vegetables: 4-5 servings a day Low-fat dairy: 2 servings a day Fish, poultry, or lean meat: 1 serving a day  Beans and legumes: 2 or more servings a week Nuts and seeds: 1-2 servings a day Whole grains: 6-8 servings a day Extra-virgin olive oil: 3-4 servings a day Limit red meat and sweets to only a few servings a month What are my food choices? Mediterranean diet Recommended Grains: Whole-grain pasta. Brown rice. Bulgar wheat. Polenta. Couscous. Whole-wheat bread. Orpah Cobb. Vegetables: Artichokes. Beets. Broccoli. Cabbage. Carrots. Eggplant. Green beans. Chard. Kale. Spinach. Onions. Leeks. Peas. Squash. Tomatoes. Peppers.  Radishes. Fruits: Apples. Apricots. Avocado. Berries. Bananas. Cherries. Dates. Figs. Grapes. Lemons. Melon. Oranges. Peaches. Plums. Pomegranate. Meats and other protein foods: Beans. Almonds. Sunflower seeds. Pine nuts. Peanuts. Cod. Salmon. Scallops. Shrimp. Tuna. Tilapia. Clams. Oysters. Eggs. Dairy: Low-fat milk. Cheese. Greek yogurt. Beverages: Water. Red wine. Herbal tea. Fats and oils: Extra virgin olive oil. Avocado oil. Grape seed oil. Sweets and desserts: [place] yogurt with honey. Baked apples. Poached pears. Trail mix. Seasoning and other foods: Basil. Cilantro. Coriander. Cumin. Mint. Parsley. Sage. Rosemary. Tarragon. Garlic. Oregano. Thyme. Pepper. Balsalmic vinegar. Tahini. Hummus. Tomato sauce. Olives. Mushrooms. Limit these Grains: Prepackaged pasta or rice dishes. Prepackaged cereal with added sugar. Vegetables: Deep fried potatoes (french fries). Fruits: Fruit canned in syrup. Meats and other protein foods: Beef. Pork. Lamb. Poultry with skin. Hot dogs. Tomasa Blase. Dairy: Ice cream. Sour cream. Whole milk. Beverages: Juice. Sugar-sweetened soft drinks. Beer. Liquor and spirits. Fats and oils: Butter. Canola oil. Vegetable oil. Beef fat (tallow). Lard. Sweets and desserts: Cookies. Cakes. Pies. Candy. Seasoning and other foods: Mayonnaise. Premade sauces and marinades. The items listed may not be a complete list. Talk with your dietitian about what dietary choices are right for you. Summary The Mediterranean diet includes both food and lifestyle choices. Eat a variety of fresh fruits and vegetables, beans, nuts, seeds, and whole grains. Limit the amount of red meat and sweets that you eat. Talk with your health care provider about whether it is safe for you to drink red wine in moderation. This means 1 glass a day for nonpregnant women and 2 glasses a day for men. A glass of wine equals 5 oz (150 mL). This information is not intended to replace advice given to you by your health  care provider. Make sure you discuss any questions you have with your health care provider. Document Released: 07/06/2016 Document Revised: 08/08/2016 Document Reviewed: 07/06/2016 Elsevier Interactive Patient Education  2017 [employer].

## 2023-10-03 NOTE — Progress Notes (Signed)
Assessment/Plan:   Primary progressive aphasia Mild Cognitive Impairment, Severe  Robert Mann is a very pleasant 72 y.o. 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 Patient is currently on donepezil 10 mg daily and memantine 5 mg twice daily.  Overall, his memory is stable except for delayed recall slightly decreased from prior.  Unable to perform MMSE due to verbal difficulties.  He is trying on a daily basis to do home speech therapy with his wife to prevent worsening of verbal abilities.  He is able to participate on his ADLs, continues to drive without any issues.  His mood is good.    Follow up in 6  months. Continue donepezil 10 mg daily and increase memantine to 10 mg twice daily for better coverage.  Side effects discussed  Recommend good control of her cardiovascular risk factors.  Patient informed of the abnormal blood pressure he will contact his PCP. Continue to control mood as per PCP     Subjective:    This patient is here alone.  Previous records as well as any outside records available were reviewed prior to todays visit. Patient was last seen on 04/25/2023 with MMSE 19/30   Any changes in memory since last visit? "Memory is about the same". Speech is stable .  He continues to do home speech therapy with the help of his wife.".  "Wife is extremely helpful with exercising!".  repeats oneself?  Denies  Disoriented when walking into a room?  Patient denies    Leaving objects? Denies  Wandering behavior?  denies   Any personality changes since last visit?  Denies.   Any worsening depression?:  Denies.   Hallucinations or paranoia?  Denies.   Seizures? Denies,    Any sleep changes? Sleeps fairly well. Sometimes has vivid dreams, denies REM behavior or sleepwalking   Sleep apnea?   Denies.   Any hygiene  concerns? Denies.  Independent of bathing and dressing?  Endorsed  Does the patient needs help with medications? Patient is in charge   Who is in charge of the finances? Patient is in charge     Any changes in appetite?  Denies.     Patient have trouble swallowing? Denies.   Does the patient cook? Sometimes, no accidents  Any headaches?   Denies.   Chronic back pain  denies.   Ambulates with difficulty? Denies. Walks [id number] steps or more a day.  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 Recent falls or head injuries? Denies.     Unilateral weakness, numbness or tingling? Denies.   Any tremors?  Denies   Any anosmia?  Denies   Any incontinence of urine?  Denies  Any bowel dysfunction?   Denies      Patient lives with wife Does the patient drive? 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     Labs 09/08/21 CBC, CMP normal TC 227, TG 153, LDL 152  TSH 1.79 B12 >2000 04/2021   MRI brain wo contrast 08/18/21 No evidence of acute intracranial abnormality. Prominent perivascular space versus chronic lacunar infarct within the central pons. Moderate generalized cerebral atrophy with comparatively mild cerebellar atrophy. Otherwise unremarkable MRI appearance of the brain for age. Small left maxillary  sinus mucous retention cyst.     Neuropsychological evaluation on 09/02/2021 Dr. Milbert Coulter "Briefly, results suggested diffuse cognitive impairment relative to age-matched peers. He did exhibit appropriate performances across visuospatial abilities and confrontation naming. However, all other assessed domains exhibited severe cognitive impairment. 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 This neurodegenerative disease process is characterized by pronounced impairment  with language and executive dysfunction when in earlier stages. While Robert Mann showed relatively diffuse impairment, these areas arguably exhibited greatest impairment. In particular, the non-fluent PPA subtype is characterized by the evolution of significant word finding difficulties and halting, effortful speech. These symptoms were very prevalent during the current evaluation, as well as reported by Robert Mann and his wife as being present and worsening over the past 1-2 years. Specific to Alzheimer's disease, [name] did show significant memory impairment across testing. However, it is worth highlighting that he exhibited appropriate retention scores across 3/4 memory tasks. This would suggest that greater memory difficulty is across encoding (i.e., learning) and retrieval aspects of memory, likely worsened by profound deficits in receptive and expressive language. Strong retention scores would not suggest a primary memory storage deficit, which would be inconsistent with typically presenting Alzheimer's disease"  When meeting with his neurologist, I would recommend that they discuss neurological medications which may slow decline. It is important to highlight that these medications may slow functional decline in some individuals. Unfortunately, there is no current treatment which is able to stop or reverse these changes. To aid with further diagnostic clarity, they could also discuss the pros and cons of an FDG-PET scan as this can yield helpful information in cases such as this.       Initial Visit  09/15/21 The patient is seen in neurologic consultation at the request of [name], Kandee Keen, NP for the evaluation of memory loss in the setting of Primary Progressive Aphasia.  The patient is here alone.  This is a 72 year old right-handed male who had memory issues for about 2 years.  He describes it as "I have the words in my head, but can get them out ".  This has been worse over the last 6 months.  He  lives with his wife who has also noticed the same changes.  His mood is good, but he is also very cautious about talking to people, which has made him more withdrawn, and reviewed that he is afraid that people will notice.  He denies depression or irritability.  He likes to read, but he has to sometimes read the same article several times until "getting it ".  During the visit, he has shown some frustration in trying to find the right word.  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  He noted yes the porch.   He sleeps well, denies vivid dreams or sleepwalking, hallucinations or paranoia.  He denies leaving objects in unusual places, but cannot remember at times where he left him.  He is independent of bathing and dressing, no issues with hygiene.  He does not forget to take his medications, he is in charge of his finances, denies missing any bills.  His appetite is good, denies any trouble swallowing, or sialorrhea.  He has a history of reflux, that at times causes him to have some throat discomfort.  His wife does the cooking.  He ambulates about  10 miles a day without difficulties, denies any falls.  Over the years he had several minor injuries in the head, stating that "I build stuff and hit my head several times ".  He continues to drive without getting lost.  He denies any headaches, double vision, dizziness, focal numbness or tingling, unilateral weakness, tremors or anosmia.  No history of seizures.  Denies urine incontinence, retention constipation or diarrhea.  He denies a history of sleep apnea, but has he does say that at times he notices snoring.  He denies a history of alcohol or tobacco.  Family history negative for dementia.  PREVIOUS MEDICATIONS:   CURRENT MEDICATIONS:  Outpatient Encounter Medications as of 10/03/2023  Medication Sig   amLODipine (NORVASC) 5 MG tablet Take 1  tablet (5 mg total) by mouth daily. SCHEDULE APPT FOR ANY FUTURE REFILLSTAKE 1 TABLET (5 MG TOTAL) BY MOUTH DAILY.   Blood Pressure Monitoring (BLOOD PRESSURE MONITOR/M CUFF) MISC One cuff for management of hypertension   donepezil (ARICEPT) 10 MG tablet TAKE 1 TABLET BY MOUTH AT NIGHT   fexofenadine (ALLEGRA ODT) 30 MG disintegrating tablet Take 30 mg by mouth daily.   losartan (COZAAR) 25 MG tablet Take 1 tablet (25 mg total) by mouth daily.   memantine (NAMENDA) 5 MG tablet Take 1 tablet (5 mg at night) for 2 weeks, then increase to 1 tablet (5 mg) twice a day   Multiple Vitamins-Minerals (CENTRUM SILVER PO) Take 1 tablet by mouth daily.   naproxen sodium (ALEVE) 220 MG tablet Take 220 mg by mouth as needed.   trimethoprim-polymyxin b (POLYTRIM) ophthalmic solution Place 2 drops into both eyes every 4 (four) hours.   Facility-Administered Encounter Medications as of 10/03/2023  Medication   0.9 %  sodium chloride infusion       04/25/2023   12:00 PM 10/13/2022    9:00 AM  MMSE - Mini Mental State Exam  Orientation to time 4 4  Orientation to Place 5 5  Registration 2 3  Attention/ Calculation 0 2  Recall 2 3  Language- name 2 objects 2 2  Language- repeat 0 0  Language- follow 3 step command 3 3  Language- read & follow direction 1 1  Write a sentence 0 0  Copy design 0 1  Total score 19 24       No data to display          Objective:     PHYSICAL EXAMINATION:    VITALS:   Vitals:   10/03/23 0848  BP: (!) 147/102  Pulse: 74  Resp: 20  SpO2: 98%  Weight: 170 lb (77.1 kg)  Height: 5\' 10"  (1.778 m)    GEN:  The patient appears stated age and is in NAD. HEENT:  Normocephalic, atraumatic.   Neurological examination:  General: NAD, well-groomed, appears stated age. Orientation: The patient is alert. Oriented to person, place and date Cranial nerves: There is good facial symmetry.The speech is known fluent but clear.  He has aphasia without dysarthria, not  worse from prior. Fund of knowledge is appropriate. Recent and remote memory are impaired. Attention and concentration are reduced.  Able to name objects and unable to repeat phrases Hearing is intact to conversational tone.  Sensation: Sensation is intact to light touch throughout Motor: Strength is at least antigravity x4. DTR's 2/4 in UE/LE     Movement examination: Tone: There is normal tone in the UE/LE Abnormal movements:  no tremor.  No myoclonus.  No asterixis.  Coordination:  There is no decremation with RAM's. Normal finger to nose  Gait and Station: The patient has no difficulty arising out of a deep-seated chair without the use of the hands. The patient's stride length is good.  Gait is cautious and narrow.    Thank you for allowing [place] the opportunity to participate in the care of this nice patient. Please do not hesitate to contact us for any questions or concerns.   Total time spent on today's visit was 26 minutes dedicated to this patient today, preparing to see patient, examining the patient, ordering tests and/or medications and counseling the patient, documenting clinical information in the EHR or other health record, independently interpreting results and communicating results to the patient/family, discussing treatment and goals, answering patient's questions and coordinating care.  Cc:  Shirline Frees, NP  Marlowe Kays 10/03/2023 9:12 AM

## 2023-10-18 ENCOUNTER — Encounter: Payer: Self-pay | Admitting: Adult Health

## 2023-10-18 NOTE — Telephone Encounter (Signed)
Does pt need a visit for a referral?

## 2023-10-24 DIAGNOSIS — M25561 Pain in right knee: Secondary | ICD-10-CM | POA: Diagnosis not present

## 2023-10-24 DIAGNOSIS — M23300 Other meniscus derangements, unspecified lateral meniscus, right knee: Secondary | ICD-10-CM | POA: Diagnosis not present

## 2023-12-05 DIAGNOSIS — M25561 Pain in right knee: Secondary | ICD-10-CM | POA: Diagnosis not present

## 2023-12-09 ENCOUNTER — Other Ambulatory Visit: Payer: Self-pay | Admitting: Adult Health

## 2023-12-09 DIAGNOSIS — I10 Essential (primary) hypertension: Secondary | ICD-10-CM

## 2023-12-19 ENCOUNTER — Other Ambulatory Visit: Payer: Self-pay | Admitting: Adult Health

## 2023-12-26 ENCOUNTER — Encounter: Payer: Self-pay | Admitting: Adult Health

## 2023-12-26 ENCOUNTER — Ambulatory Visit (INDEPENDENT_AMBULATORY_CARE_PROVIDER_SITE_OTHER): Payer: Medicare HMO | Admitting: Adult Health

## 2023-12-26 VITALS — BP 140/84 | HR 83 | Temp 98.1°F | Ht 70.0 in | Wt 172.0 lb

## 2023-12-26 DIAGNOSIS — G3101 Pick's disease: Secondary | ICD-10-CM

## 2023-12-26 DIAGNOSIS — I10 Essential (primary) hypertension: Secondary | ICD-10-CM

## 2023-12-26 DIAGNOSIS — F0280 Dementia in other diseases classified elsewhere without behavioral disturbance: Secondary | ICD-10-CM

## 2023-12-26 DIAGNOSIS — F09 Unspecified mental disorder due to known physiological condition: Secondary | ICD-10-CM | POA: Diagnosis not present

## 2023-12-26 DIAGNOSIS — E782 Mixed hyperlipidemia: Secondary | ICD-10-CM | POA: Diagnosis not present

## 2023-12-26 DIAGNOSIS — Z125 Encounter for screening for malignant neoplasm of prostate: Secondary | ICD-10-CM | POA: Diagnosis not present

## 2023-12-26 DIAGNOSIS — Z0000 Encounter for general adult medical examination without abnormal findings: Secondary | ICD-10-CM | POA: Diagnosis not present

## 2023-12-26 LAB — CBC
HCT: 49.8 % (ref 39.0–52.0)
Hemoglobin: 16.4 g/dL (ref 13.0–17.0)
MCHC: 32.9 g/dL (ref 30.0–36.0)
MCV: 91 fL (ref 78.0–100.0)
Platelets: 242 10*3/uL (ref 150.0–400.0)
RBC: 5.47 Mil/uL (ref 4.22–5.81)
RDW: 14.1 % (ref 11.5–15.5)
WBC: 12 10*3/uL — ABNORMAL HIGH (ref 4.0–10.5)

## 2023-12-26 LAB — COMPREHENSIVE METABOLIC PANEL
ALT: 17 U/L (ref 0–53)
AST: 16 U/L (ref 0–37)
Albumin: 4.3 g/dL (ref 3.5–5.2)
Alkaline Phosphatase: 74 U/L (ref 39–117)
BUN: 24 mg/dL — ABNORMAL HIGH (ref 6–23)
CO2: 29 meq/L (ref 19–32)
Calcium: 9.1 mg/dL (ref 8.4–10.5)
Chloride: 107 meq/L (ref 96–112)
Creatinine, Ser: 1.22 mg/dL (ref 0.40–1.50)
GFR: 59.69 mL/min — ABNORMAL LOW (ref >60.00)
Glucose, Bld: 105 mg/dL — ABNORMAL HIGH (ref 70–99)
Potassium: 4.5 meq/L (ref 3.5–5.1)
Sodium: 143 meq/L (ref 135–145)
Total Bilirubin: 0.7 mg/dL (ref 0.2–1.2)
Total Protein: 6.9 g/dL (ref 6.0–8.3)

## 2023-12-26 LAB — LIPID PANEL
Cholesterol: 262 mg/dL — ABNORMAL HIGH (ref 0–200)
HDL: 45.4 mg/dL (ref >39.00)
LDL Cholesterol: 190 mg/dL — ABNORMAL HIGH (ref 0–99)
NonHDL: 216.63
Total CHOL/HDL Ratio: 6
Triglycerides: 134 mg/dL (ref 0.0–149.0)
VLDL: 26.8 mg/dL (ref 0.0–40.0)

## 2023-12-26 LAB — TSH: TSH: 1.5 u[IU]/mL (ref 0.35–5.50)

## 2023-12-26 LAB — PSA: PSA: 2.43 ng/mL (ref 0.10–4.00)

## 2023-12-26 NOTE — Progress Notes (Signed)
Subjective:    Patient ID: Robert Mann, male    DOB: March 01, 1951, 73 y.o.   MRN: [id number]  HPI Patient presents for yearly preventative medicine examination. He is a pleasant 73 year old male who  has a past medical history of Essential hypertension, History of kidney stones, History of melanoma (01/2018), History of placement of chest tube (10/1979), History of pneumothorax (1980), Hyperlipidemia, Mild neurocognitive disorder, severe (08/26/2021), and Primary progressive aphasia (08/26/2021).  HTN -currently prescribed Norvasc 5 mg and losartan 25 mg daily.  He denies dizziness, lightheadedness, chest pain, or shortness of breath.  Does monitor his blood pressures at home periodically and reports readings in the 120s to 130s over 70s to 80s. BP Readings from Last 3 Encounters:  12/26/23 (!) 140/84  10/03/23 (!) 147/95  04/25/23 (!) 156/94   Hyperlipidemia -has refused statins in the past and continues to refuse. Lab Results  Component Value Date   CHOL 276 (H) 12/22/2022   HDL 46.20 12/22/2022   LDLCALC 152 (H) 09/08/2021   LDLDIRECT 195.0 12/22/2022   TRIG 211.0 (H) 12/22/2022   CHOLHDL 6 12/22/2022   Primary progressive aphasia-he is seen by neurology for this.  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  Currently managed with Aricept 10 mg daily and memantine 10 mg BID.  His aphasia and memory seem to be stable. He continues to be independents of ADLS and is driving without any difficulties.    All immunizations and health maintenance protocols were reviewed with the patient and needed orders were placed.  Appropriate screening laboratory values were ordered for the patient including screening of hyperlipidemia, renal function and hepatic function. If indicated by BPH, a PSA was ordered.  Medication reconciliation,  past medical history, social history, problem  list and allergies were reviewed in detail with the patient  Goals were established with regard to weight loss, exercise, and  diet in compliance with medications  He is  up to date on routine colon cancer screening    Review of Systems  Constitutional: Negative.   HENT: Negative.    Eyes: Negative.   Respiratory: Negative.    Cardiovascular: Negative.   Gastrointestinal: Negative.   Endocrine: Negative.   Genitourinary: Negative.   Musculoskeletal: Negative.   Skin: Negative.   Allergic/Immunologic: Negative.   Neurological:  Positive for speech difficulty.  Hematological: Negative.   Psychiatric/Behavioral:  Positive for confusion.   All other systems reviewed and are negative.  Past Medical History:  Diagnosis Date   Essential hypertension    History of kidney stones    History of melanoma 01/2018   left chest   History of placement of chest tube 10/1979   History of pneumothorax 1980   Hyperlipidemia    Mild neurocognitive disorder, 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    Social History   Socioeconomic History   Marital status: Married    Spouse name: Not on file   Number of children: 2   Years of education: 14   Highest education level: Some college, no degree  Occupational History   Occupation: part time   Tobacco Use   Smoking status: Former    Current packs/day: 0.00    Types: Cigarettes    Quit date: 08/08/1980    Years since quitting: 43.4   Smokeless tobacco: Never   Tobacco comments:    Quit smoking cigarettes in 1981  Vaping  Use   Vaping status: Never Used  Substance and Sexual Activity   Alcohol use: Yes    Comment: social    Drug use: No   Sexual activity: Not on file  Other Topics Concern   Not on file  Social History Narrative   Working PT; job requires a tremendous amount of walking   Married    3 children   Social Drivers of Corporate investment banker Strain: Low Risk  (02/07/2023)   Overall Financial  Resource Strain (CARDIA)    Difficulty of Paying Living Expenses: Not hard at all  Food Insecurity: No Food Insecurity (02/07/2023)   Hunger Vital Sign    Worried About Running Out of Food in the Last Year: Never true    Ran Out of Food in the Last Year: Never true  Transportation Needs: No Transportation Needs (02/07/2023)   PRAPARE - Administrator, Civil Service (Medical): No    Lack of Transportation (Non-Medical): No  Physical Activity: Sufficiently Active (02/07/2023)   Exercise Vital Sign    Days of Exercise per Week: 5 days    Minutes of Exercise per Session: 60 min  Stress: No Stress Concern Present (02/07/2023)   [employer] of Occupational Health - Occupational Stress Questionnaire    Feeling of Stress : Not at all  Social Connections: Moderately Isolated (02/07/2023)   Social Connection and Isolation Panel [NHANES]    Frequency of Communication with Friends and Family: More than three times a week    Frequency of Social Gatherings with Friends and Family: More than three times a week    Attends Religious Services: Never    Database administrator or Organizations: No    Attends Banker Meetings: Never    Marital Status: Married  Catering manager Violence: Not At Risk (02/07/2023)   Humiliation, Afraid, Rape, and Kick questionnaire    Fear of Current or Ex-Partner: No    Emotionally Abused: No    Physically Abused: No    Sexually Abused: No    Past Surgical History:  Procedure Laterality Date   MELANOMA EXCISION WITH SENTINEL LYMPH NODE BIOPSY Left 02/08/2018   Procedure: WIDE EXCISION OF LEFT CHEST MELANOMA AND LEFT AXILLARY SENTINEL LYMPH NODE;  Surgeon: Violeta Gelinas, MD;  Location: [facility] OR;  Service: General;  Laterality: Left;   WISDOM TOOTH EXTRACTION      Family History  Problem Relation Age of Onset   Hypertension Mother    Stroke Mother    Hyperlipidemia Mother    Heart disease Mother    Hypertension Father    Heart attack  Father    Heart disease Father    Hyperlipidemia Father    Cancer Brother 67       Glioblastoma     Allergies  Allergen Reactions   Lisinopril Cough    Current Outpatient Medications on File Prior to Visit  Medication Sig Dispense Refill   amLODipine (NORVASC) 5 MG tablet TAKE 1 TABLET (5 MG TOTAL) BY MOUTH DAILY. SCHEDULE APPT FOR ANY FUTURE REFILLSTAKE 1 TABLET (5 MG TOTAL) BY MOUTH DAILY. 90 tablet 3   donepezil (ARICEPT) 10 MG tablet TAKE 1 TABLET BY MOUTH AT NIGHT 90 tablet 3   fexofenadine (ALLEGRA ODT) 30 MG disintegrating tablet Take 30 mg by mouth daily.     losartan (COZAAR) 25 MG tablet TAKE 1 TABLET (25 MG TOTAL) BY MOUTH DAILY. 90 tablet 3   memantine (NAMENDA) 10 MG tablet Take 1 tablet  twice a day 60 tablet 11   Multiple Vitamins-Minerals (CENTRUM SILVER PO) Take 1 tablet by mouth daily.     naproxen sodium (ALEVE) 220 MG tablet Take 220 mg by mouth as needed.     No current facility-administered medications on file prior to visit.    BP (!) 140/84   Pulse 83   Temp 98.1 F (36.7 C) (Oral)   Ht 5\' 10"  (1.778 m)   Wt 172 lb (78 kg)   SpO2 99%   BMI 24.68 kg/m       Objective:   Physical Exam Vitals and nursing note reviewed.  Constitutional:      General: He is not in acute distress.    Appearance: Normal appearance. He is not ill-appearing.  HENT:     Head: Normocephalic and atraumatic.     Right Ear: Tympanic membrane, ear canal and external ear normal. There is no impacted cerumen.     Left Ear: Tympanic membrane, ear canal and external ear normal. There is no impacted cerumen.     Nose: Nose normal. No congestion or rhinorrhea.     Mouth/Throat:     Mouth: Mucous membranes are moist.     Pharynx: Oropharynx is clear.  Eyes:     Extraocular Movements: Extraocular movements intact.     Conjunctiva/sclera: Conjunctivae normal.     Pupils: Pupils are equal, round, and reactive to light.  Neck:     Vascular: No carotid bruit.  Cardiovascular:      Rate and Rhythm: Normal rate and regular rhythm.     Pulses: Normal pulses.     Heart sounds: No murmur heard.    No friction rub. No gallop.  Pulmonary:     Effort: Pulmonary effort is normal.     Breath sounds: Normal breath sounds.  Abdominal:     General: Abdomen is flat. Bowel sounds are normal. There is no distension.     Palpations: Abdomen is soft. There is no mass.     Tenderness: There is no abdominal tenderness. There is no guarding or rebound.     Hernia: No hernia is present.  Musculoskeletal:        General: Normal range of motion.     Cervical back: Normal range of motion and neck supple.  Lymphadenopathy:     Cervical: No cervical adenopathy.  Skin:    General: Skin is warm and dry.     Capillary Refill: Capillary refill takes less than 2 seconds.  Neurological:     General: No focal deficit present.     Mental Status: He is alert and oriented to person, place, and time.     Coordination: Coordination is intact.     Gait: Gait is intact.     Comments: Speech fluent and clear but aphasia noted.   Psychiatric:        Mood and Affect: Mood normal.        Behavior: Behavior normal.        Thought Content: Thought content normal.        Judgment: Judgment normal.       Assessment & Plan:  1. Routine general medical examination at a health care facility (Primary) Today patient counseled on age appropriate routine health concerns for screening and prevention, each reviewed and up to date or declined. Immunizations reviewed and up to date or declined. Labs ordered and reviewed. Risk factors for depression reviewed and negative. Hearing function and visual acuity are intact. ADLs screened and  addressed as needed. Functional ability and level of safety reviewed and appropriate. Education, counseling and referrals performed based on assessed risks today. Patient provided with a copy of personalized plan for preventive services. - Follow up in one year or sooner if needed -  Continue to eat healthy and exercise  2. Essential hypertension - Well controlled. No change in medication  - Lipid panel; Future - TSH; Future - CBC; Future - Comprehensive metabolic panel; Future  3. Mixed hyperlipidemia - Refuses statin. Discussed CT calcium score, they will think about it.  - Lipid panel; Future - TSH; Future - CBC; Future - Comprehensive metabolic panel; Future  4. Primary progressive aphasia - Per neurology  - Lipid panel; Future - TSH; Future - CBC; Future - Comprehensive metabolic panel; Future  5. Cognitive disorder - Continue with Aricept and Namenda  - Lipid panel; Future - TSH; Future - CBC; Future - Comprehensive metabolic panel; Future  6. Prostate cancer screening  - PSA; Future   Shirline Frees, NP

## 2023-12-26 NOTE — Patient Instructions (Signed)
It was great seeing you today   We will follow up with you regarding your lab work   Please let me know if you need anything

## 2024-04-02 ENCOUNTER — Encounter: Payer: Self-pay | Admitting: Physician Assistant

## 2024-04-02 ENCOUNTER — Ambulatory Visit: Payer: Medicare HMO | Admitting: Physician Assistant

## 2024-04-02 VITALS — BP 150/89 | HR 79 | Resp 18 | Ht 70.0 in | Wt 167.0 lb

## 2024-04-02 DIAGNOSIS — F0280 Dementia in other diseases classified elsewhere without behavioral disturbance: Secondary | ICD-10-CM

## 2024-04-02 DIAGNOSIS — G3184 Mild cognitive impairment, so stated: Secondary | ICD-10-CM

## 2024-04-02 DIAGNOSIS — G3101 Pick's disease: Secondary | ICD-10-CM | POA: Diagnosis not present

## 2024-04-02 NOTE — Progress Notes (Signed)
 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 RH male with a history ofhypertension, hyperlipidemia, h/o melanoma L chest and L shoulder and a diagnosis of severe MCI and PPA per Neuropsych evaluation seen today in follow up for memory loss. Patient is currently on donepezil  10 mg daily and memantine  10 mg twice daily.  Memory is**.  Unable to perform MMSE due to verbal difficulties.  He is primarily related to the home speech therapy with his wife to prevent worsening verbal abilities.  He is able to participate on his ADLs, continues to drive without any issues, mood is good recommend good control of her cardiovascular risk factors   Continue to control mood as per PCP Continue donepezil  10 mg daily and memantine  10 mg twice daily, side effects discussed    Subjective:    This patient is her alone.  Previous records as well as any outside records available were reviewed prior to todays visit. Patient was last seen on 10/03/2023, last MMSE on 520 9/24//2024 19/30***   Any changes in memory since last visit? ".  He continues to do home speech therapy with the help of his wife. repeats oneself?  Endorsed Disoriented when walking into a room?  Patient denies ***  Leaving objects?  May misplace things but not in unusual places***  Wandering behavior?  denies   Any personality changes since last visit?  Denies.   Any worsening depression?:  Denies.   Hallucinations or paranoia?  Denies.   Seizures? denies    Any sleep changes? Sleeps well. Denies vivid dreams, REM behavior or sleepwalking   Sleep apnea?   Denies.   Any hygiene concerns? Denies.  Independent of bathing and dressing?  Endorsed  Does the patient needs help with medications? Patient   is in charge *** Who is in charge of the finances? Patient  is in charge   *** Any changes in appetite?  Denies.      Patient have trouble  swallowing? HE coughs in the morning, all phelgm and then cough, I have to try to get it out  Does the patient cook? Yes, he likes to bbq Any headaches?   denies   Chronic back pain  denies   Ambulates with difficulty? Denies.    Recent falls or head injuries? Denies    Unilateral weakness, numbness or tingling? denies   Any tremors?  Denies   Any anosmia?  Denies   Any incontinence of urine?  Denies  Any bowel dysfunction?   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      Neuropsychological evaluation on 09/02/2021 Dr. Kitty Perkins "Briefly, results suggested diffuse cognitive impairment relative to age-matched peers. He did exhibit appropriate performances across visuospatial abilities and confrontation naming. However, all other assessed domains exhibited severe cognitive impairment. 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 This neurodegenerative disease process is characterized by pronounced impairment with language and executive dysfunction when in earlier stages. While Robert Mann showed relatively diffuse impairment, these areas arguably exhibited greatest impairment. In particular, the non-fluent PPA subtype is characterized by the evolution of significant word finding difficulties and halting, effortful speech. These symptoms were  very prevalent during the current evaluation, as well as reported by Robert Mann and his wife as being present and worsening over the past 1-2 years. Specific to Alzheimer's disease, Robert Mann did show significant memory impairment across testing. However, it is worth highlighting that he exhibited appropriate retention scores across 3/4 memory tasks. This would suggest  that greater memory difficulty is across encoding (i.e., learning) and retrieval aspects of memory, likely worsened by profound deficits in receptive and expressive language. Strong retention scores would not suggest a primary memory storage deficit, which would be inconsistent with typically presenting Alzheimer's disease"  When meeting with his neurologist, I would recommend that they discuss neurological medications which may slow decline. It is important to highlight that these medications may slow functional decline in some individuals. Unfortunately, there is no current treatment which is able to stop or reverse these changes. To aid with further diagnostic clarity, they could also discuss the pros and cons of an FDG-PET scan as this can yield helpful information in cases such as this.       Initial Visit  09/15/21 The patient is seen in neurologic consultation at the request of [name], Randel Buss, NP for the evaluation of memory loss in the setting of Primary Progressive Aphasia.  The patient is here alone.  This is a 73 year old right-handed male who had memory issues for about 2 years.  He describes it as "I have the words in my head, but can get them out ".  This has been worse over the last 6 months.  He lives with his wife who has also noticed the same changes.  His mood is good, but he is also very cautious about talking to people, which has made him more withdrawn, and reviewed that he is afraid that people will notice.  He denies depression or irritability.  He likes to read, but he has to sometimes read the same article several times until "getting it ".  During the visit, he has shown some frustration in trying to find the right word.  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  He noted yes the porch.   He sleeps well, denies vivid dreams or sleepwalking, hallucinations or paranoia.   He denies leaving objects in unusual places, but cannot remember at times where he left him.  He is independent of bathing and dressing, no issues with hygiene.  He does not forget to take his medications, he is in charge of his finances, denies missing any bills.  His appetite is good, denies any trouble swallowing, or sialorrhea.  He has a history of reflux, that at times causes him to have some throat discomfort.  His wife does the cooking.  He ambulates about 10 miles a day without difficulties, denies any falls.  Over the years he had several minor injuries in the head, stating that "I build stuff and hit my head several times ".  He continues to drive without getting lost.  He denies any headaches, double vision, dizziness, focal numbness or tingling, unilateral weakness, tremors or anosmia.  No history of seizures.  Denies urine incontinence, retention constipation or diarrhea.  He denies a history of sleep apnea, but has he does say that at times he notices snoring.  He denies a history of alcohol or tobacco.  Family history negative for dementia.  PREVIOUS MEDICATIONS:   CURRENT MEDICATIONS:  Outpatient Encounter  Medications as of 04/02/2024  Medication Sig   amLODipine  (NORVASC ) 5 MG tablet TAKE 1 TABLET (5 MG TOTAL) BY MOUTH DAILY. SCHEDULE APPT FOR ANY FUTURE REFILLSTAKE 1 TABLET (5 MG TOTAL) BY MOUTH DAILY.   donepezil  (ARICEPT ) 10 MG tablet TAKE 1 TABLET BY MOUTH AT NIGHT   fexofenadine (ALLEGRA ODT) 30 MG disintegrating tablet Take 30 mg by mouth daily.   losartan  (COZAAR ) 25 MG tablet TAKE 1 TABLET (25 MG TOTAL) BY MOUTH DAILY.   memantine  (NAMENDA ) 10 MG tablet Take 1 tablet  twice a day   Multiple Vitamins-Minerals (CENTRUM SILVER PO) Take 1 tablet by mouth daily.   naproxen sodium (ALEVE) 220 MG tablet Take 220 mg by mouth as needed.   No facility-administered encounter medications on file as of 04/02/2024.       04/25/2023   12:00 PM 10/13/2022    9:00 AM  MMSE - Mini Mental  State Exam  Orientation to time 4 4  Orientation to Place 5 5  Registration 2 3  Attention/ Calculation 0 2  Recall 2 3  Language- name 2 objects 2 2  Language- repeat 0 0  Language- follow 3 step command 3 3  Language- read & follow direction 1 1  Write a sentence 0 0  Copy design 0 1  Total score 19 24       No data to display          Objective:     PHYSICAL EXAMINATION:    VITALS:  There were no vitals filed for this visit.  GEN:  The patient appears stated age and is in NAD. HEENT:  Normocephalic, atraumatic.   Neurological examination:  General: NAD, well-groomed, appears stated age. Orientation: The patient is alert. Oriented to person, place and date Cranial nerves: There is good facial symmetry.The speech is no fluent but clear.  He has aphasia without dysarthria***or dysarthria. Fund of knowledge is appropriate. Recent and remote memory are impaired. Attention and concentration are reduced.  Able to name objects and repeat phrases.  Hearing is intact to conversational tone. *** Sensation: Sensation is intact to light touch throughout Motor: Strength is at least antigravity x4. DTR's 2/4 in UE/LE     Movement examination: Tone: There is normal tone in the UE/LE Abnormal movements:  no tremor.  No myoclonus.  No asterixis.   Coordination:  There is no decremation with RAM's. Normal finger to nose  Gait and Station: The patient has no*** difficulty arising out of a deep-seated chair without the use of the hands. The patient's stride length is good.  Gait is cautious and narrow.    Thank you for allowing us  the opportunity to participate in the care of this nice patient. Please do not hesitate to contact us  for any questions or concerns.   Total time spent on today's visit was *** minutes dedicated to this patient today, preparing to see patient, examining the patient, ordering tests and/or medications and counseling the patient, documenting clinical  information in the EHR or other health record, independently interpreting results and communicating results to the patient/family, discussing treatment and goals, answering patient's questions and coordinating care.  Cc:  Alto Atta, NP  Tex Filbert 04/02/2024 6:24 AM

## 2024-04-02 NOTE — Patient Instructions (Addendum)
 It was a pleasure to see you today at our office.   Recommendations:  Meds: Follow up in 6  months  Referral to Speech therapy at [facility]  Continue donepezil  10 mg nightly  Continue Memantine  10 mg tablets  twice daily.         RECOMMENDATIONS FOR ALL PATIENTS WITH MEMORY PROBLEMS: 1. Continue to exercise (Recommend 30 minutes of walking everyday, or 3 hours every week) 2. Increase social interactions - continue going to [place] and enjoy social gatherings with friends and family 3. Eat healthy, avoid fried foods and eat more fruits and vegetables 4. Maintain adequate blood pressure, blood sugar, and blood cholesterol level. Reducing the risk of stroke and cardiovascular disease also helps promoting better memory. 5. Avoid stressful situations. Live a simple life and avoid aggravations. Organize your time and prepare for the next day in anticipation. 6. Sleep well, avoid any interruptions of sleep and avoid any distractions in the bedroom that may interfere with adequate sleep quality 7. Avoid sugar, avoid sweets as there is a strong link between excessive sugar intake, diabetes, and cognitive impairment We discussed the Mediterranean diet, which has been shown to help patients reduce the risk of progressive memory disorders and reduces cardiovascular risk. This includes eating fish, eat fruits and green leafy vegetables, nuts like almonds and hazelnuts, walnuts, and also use olive oil. Avoid fast foods and fried foods as much as possible. Avoid sweets and sugar as sugar use has been linked to worsening of memory function.  There is always a concern of gradual progression of memory problems. If this is the case, then we may need to adjust level of care according to patient needs. Support, both to the patient and caregiver, should then be put into place.       FALL PRECAUTIONS: Be cautious when walking. Scan the area for obstacles that may increase the risk of trips and falls. When  getting up in the mornings, sit up at the edge of the bed for a few minutes before getting out of bed. Consider elevating the bed at the head end to avoid drop of blood pressure when getting up. Walk always in a well-lit room (use night lights in the walls). Avoid area rugs or power cords from appliances in the middle of the walkways. Use a walker or a cane if necessary and consider physical therapy for balance exercise. Get your eyesight checked regularly.  FINANCIAL OVERSIGHT: Supervision, especially oversight when making financial decisions or transactions is also recommended.  HOME SAFETY: Consider the safety of the kitchen when operating appliances like stoves, microwave oven, and blender. Consider having supervision and share cooking responsibilities until no longer able to participate in those. Accidents with firearms and other hazards in the house should be identified and addressed as well.   ABILITY TO BE LEFT ALONE: If patient is unable to contact 911 operator, consider using LifeLine, or when the need is there, arrange for someone to stay with patients. Smoking is a fire hazard, consider supervision or cessation. Risk of wandering should be assessed by caregiver and if detected at any point, supervision and safe proof recommendations should be instituted.  MEDICATION SUPERVISION: Inability to self-administer medication needs to be constantly addressed. Implement a mechanism to ensure safe administration of the medications.   DRIVING: Regarding driving, in patients with progressive memory problems, driving will be impaired. We advise to have someone else do the driving if trouble finding directions or if minor accidents are reported. Independent driving  assessment is available to determine safety of driving.   If you are interested in the driving assessment, you can contact the following:  [facility] [employer] [facility] in [place] [employer] [phone]  [facility]  [phone]  [facility] [phone] or [phone]      Mediterranean Diet A Mediterranean diet refers to food and lifestyle choices that are based on the traditions of countries located on the [employer]. This way of eating has been shown to help prevent certain conditions and improve outcomes for people who have chronic diseases, like kidney disease and heart disease. What are tips for following this plan? Lifestyle  Cook and eat meals together with your family, when possible. Drink enough fluid to keep your urine clear or pale yellow. Be physically active every day. This includes: Aerobic exercise like running or swimming. Leisure activities like gardening, walking, or housework. Get 7-8 hours of sleep each night. If recommended by your health care provider, drink red wine in moderation. This means 1 glass a day for nonpregnant women and 2 glasses a day for men. A glass of wine equals 5 oz (150 mL). Reading food labels  Check the serving size of packaged foods. For foods such as rice and pasta, the serving size refers to the amount of cooked product, not dry. Check the total fat in packaged foods. Avoid foods that have saturated fat or trans fats. Check the ingredients list for added sugars, such as corn syrup. Shopping  At the grocery store, buy most of your food from the areas near the walls of the store. This includes: Fresh fruits and vegetables (produce). Grains, beans, nuts, and seeds. Some of these may be available in unpackaged forms or large amounts (in bulk). Fresh seafood. Poultry and eggs. Low-fat dairy products. Buy whole ingredients instead of prepackaged foods. Buy fresh fruits and vegetables in-season from local farmers markets. Buy frozen fruits and vegetables in resealable bags. If you do not have access to quality fresh seafood, buy precooked frozen shrimp or canned fish, such as tuna, salmon, or sardines. Buy  small amounts of raw or cooked vegetables, salads, or olives from the deli or salad bar at your store. Stock your pantry so you always have certain foods on hand, such as olive oil, canned tuna, canned tomatoes, rice, pasta, and beans. Cooking  Cook foods with extra-virgin olive oil instead of using butter or other vegetable oils. Have meat as a side dish, and have vegetables or grains as your main dish. This means having meat in small portions or adding small amounts of meat to foods like pasta or stew. Use beans or vegetables instead of meat in common dishes like chili or lasagna. Experiment with different cooking methods. Try roasting or broiling vegetables instead of steaming or sauteing them. Add frozen vegetables to soups, stews, pasta, or rice. Add nuts or seeds for added healthy fat at each meal. You can add these to yogurt, salads, or vegetable dishes. Marinate fish or vegetables using olive oil, lemon juice, garlic, and fresh herbs. Meal planning  Plan to eat 1 vegetarian meal one day each week. Try to work up to 2 vegetarian meals, if possible. Eat seafood 2 or more times a week. Have healthy snacks readily available, such as: Vegetable sticks with hummus. Greek yogurt. Fruit and nut trail mix. Eat balanced meals throughout the week. This includes: Fruit: 2-3 servings a day Vegetables: 4-5 servings a day Low-fat dairy: 2 servings a day Fish, poultry, or  lean meat: 1 serving a day Beans and legumes: 2 or more servings a week Nuts and seeds: 1-2 servings a day Whole grains: 6-8 servings a day Extra-virgin olive oil: 3-4 servings a day Limit red meat and sweets to only a few servings a month What are my food choices? Mediterranean diet Recommended Grains: Whole-grain pasta. Brown rice. Bulgar wheat. Polenta. Couscous. Whole-wheat bread. Dwyane Glad. Vegetables: Artichokes. Beets. Broccoli. Cabbage. Carrots. Eggplant. Green beans. Chard. Kale. Spinach. Onions. Leeks. Peas.  Squash. Tomatoes. Peppers. Radishes. Fruits: Apples. Apricots. Avocado. Berries. Bananas. Cherries. Dates. Figs. Grapes. Lemons. Melon. Oranges. Peaches. Plums. Pomegranate. Meats and other protein foods: Beans. Almonds. Sunflower seeds. Pine nuts. Peanuts. Cod. Salmon. Scallops. Shrimp. Tuna. Tilapia. Clams. Oysters. Eggs. Dairy: Low-fat milk. Cheese. Greek yogurt. Beverages: Water. Red wine. Herbal tea. Fats and oils: Extra virgin olive oil. Avocado oil. Grape seed oil. Sweets and desserts: [place] yogurt with honey. Baked apples. Poached pears. Trail mix. Seasoning and other foods: Basil. Cilantro. Coriander. Cumin. Mint. Parsley. Sage. Rosemary. Tarragon. Garlic. Oregano. Thyme. Pepper. Balsalmic vinegar. Tahini. Hummus. Tomato sauce. Olives. Mushrooms. Limit these Grains: Prepackaged pasta or rice dishes. Prepackaged cereal with added sugar. Vegetables: Deep fried potatoes (french fries). Fruits: Fruit canned in syrup. Meats and other protein foods: Beef. Pork. Lamb. Poultry with skin. Hot dogs. Helene Loader. Dairy: Ice cream. Sour cream. Whole milk. Beverages: Juice. Sugar-sweetened soft drinks. Beer. Liquor and spirits. Fats and oils: Butter. Canola oil. Vegetable oil. Beef fat (tallow). Lard. Sweets and desserts: Cookies. Cakes. Pies. Candy. Seasoning and other foods: Mayonnaise. Premade sauces and marinades. The items listed may not be a complete list. Talk with your dietitian about what dietary choices are right for you. Summary The Mediterranean diet includes both food and lifestyle choices. Eat a variety of fresh fruits and vegetables, beans, nuts, seeds, and whole grains. Limit the amount of red meat and sweets that you eat. Talk with your health care provider about whether it is safe for you to drink red wine in moderation. This means 1 glass a day for nonpregnant women and 2 glasses a day for men. A glass of wine equals 5 oz (150 mL). This information is not intended to replace advice  given to you by your health care provider. Make sure you discuss any questions you have with your health care provider. Document Released: 07/06/2016 Document Revised: 08/08/2016 Document Reviewed: 07/06/2016 Elsevier Interactive Patient Education  2017 [employer].

## 2024-05-05 ENCOUNTER — Ambulatory Visit: Attending: Physician Assistant

## 2024-05-05 DIAGNOSIS — F0280 Dementia in other diseases classified elsewhere without behavioral disturbance: Secondary | ICD-10-CM | POA: Diagnosis not present

## 2024-05-05 DIAGNOSIS — R4701 Aphasia: Secondary | ICD-10-CM | POA: Insufficient documentation

## 2024-05-05 DIAGNOSIS — G3101 Pick's disease: Secondary | ICD-10-CM | POA: Diagnosis not present

## 2024-05-05 NOTE — Therapy (Signed)
 OUTPATIENT SPEECH LANGUAGE PATHOLOGY APHASIA EVALUATION   Patient Name: Robert Mann MRN: [id number] DOB:08-Feb-1951, 73 y.o., male Today's Date: 05/05/2024  PCP: Brena Calk, NP REFERRING PROVIDER: Tex Filbert, PA  END OF SESSION:  End of Session - 05/05/24 1525     Visit Number 1    Number of Visits 17    Date for SLP Re-Evaluation 07/04/24    SLP Start Time 0933    SLP Stop Time  1015    SLP Time Calculation (min) 42 min    Activity Tolerance Patient tolerated treatment well;Other (comment)   demonstrated visible frustration with inability to verbally communicate or via written communication            Past Medical History:  Diagnosis Date   Essential hypertension    History of kidney stones    History of melanoma 01/2018   left chest   History of placement of chest tube 10/1979   History of pneumothorax 1980   Hyperlipidemia    Mild neurocognitive disorder, 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   Past Surgical History:  Procedure Laterality Date   MELANOMA EXCISION WITH SENTINEL LYMPH NODE BIOPSY Left 02/08/2018   Procedure: WIDE EXCISION OF LEFT CHEST MELANOMA AND LEFT AXILLARY SENTINEL LYMPH NODE;  Surgeon: Dorena Gander, MD;  Location: [facility] OR;  Service: General;  Laterality: Left;   WISDOM TOOTH EXTRACTION     Patient Active Problem List   Diagnosis Date Noted   Mild neurocognitive disorder, 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   Essential hypertension 08/25/2021   Hyperlipidemia 08/25/2021   History of kidney stones 08/25/2021   History of melanoma 01/2018    ONSET DATE: script 04/02/24  REFERRING DIAG:  G31.01,F02.80 (ICD-10-CM) - Primary progressive aphasia (HCC)    THERAPY DIAG:  Aphasia  Primary progressive aphasia  Rationale for Evaluation and Treatment: Rehabilitation  SUBJECTIVE:   SUBJECTIVE STATEMENT: "You know, You know one, You know um um, The blower and everything else is (2  syllable gibberish)"  Pt accompanied by: significant other  PERTINENT HISTORY:  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 Seen for approx 10 visits of ST in 2022-2023. hypertension, hyperlipidemia, h/o melanoma L chest and L shoulder. 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   PAIN:  Are you having pain? No  FALLS: Has patient fallen in last 6 months?  No  LIVING ENVIRONMENT: Lives with: lives with their spouse Lives in: House/apartment  PLOF:  Level of assistance: Independent with ADLs, Needed assistance with IADLS Employment: Retired  PATIENT GOALS: Improve expressive language  OBJECTIVE:  Note: Objective measures were completed at Evaluation unless otherwise noted.  DIAGNOSTIC FINDINGS:  [name] 04/02/24 Robert Mann is a very pleasant 73 y.o. 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 Patient is currently on donepezil  10 mg daily and memantine  10 mg twice daily.  Memory is stable but he was unable to perform MMSE due to verbal difficulties which became very frustrating to him.  In the past, he has done speech therapy but in view of worsening speech, we discussed referring him again and he agrees .He is able to participate on his ADLs, continues to drive without any issues, mood is good recommend good control of  her cardiovascular risk factors   COGNITION: Overall cognitive status: Difficulty to assess due to: Communication impairment  AUDITORY COMPREHENSION: Overall auditory comprehension: difficult to assess today due to pt's level of impairment in conversation. Formal assessment completed by session 1.  EXPRESSION: [name] states "We spend a lot of time playing 'guess the noun' ", and, "We  do 20 questions a lot."  VERBAL EXPRESSION: Level of generative/spontaneous verbalization: word and phrase Comments: Today's assessment focused on pt's and wife's thoughts about how pt's sx have changed since d/c from ST in January 2023. Pt took PROM (Communication Effectiveness Survey) today - results below. Interfering components: premorbid deficit Effective technique: sentence completion and yes/no questions Non-verbal means of communication: pt does not use picture book with pertinent pictures [name] found on the internet, only rarely uses gestures but [name] has to encourage him to do this.  Well for me you I it it started to I just cant' think um um I have to  I look at something and I (3 syllables gibberish) know what it was And know it Why and what But I can't express it And um um I just I  I don't know  (:42)  You know You know one You know um um  The blower and everything else is (2 syllable gibberish) I I I I I don't know. I just don't I can't I can't things to you. You know? Your name? OK?   (2 syll gibberish) Now we're good.  Ok  OK You know knew all that.  You know you're  Yours I remember. Single and all that stuff. But I can't  My  Long  (:34)  At home You know on the day There's Every I know what I need to know but I can't know I know what it is and I can't get it out You know It's just like I spend a lot of time  And it's not her Because of her You know what not Uh Way I Uh (:43)  And I I drive OK? And I can do that and know where I'm going.  And all that And all that And um go the (2 syllable gibberish) She  We  Sh- She lets me take it to the (:35)  WRITTEN EXPRESSION: Dominant hand: right Written expression: Impaired: word "One -  letter and and the rest agh"  MOTOR SPEECH: Overall motor speech: difficult to assess due to level of language impairment and pt frustration level Respiration: thoracic  breathing Phonation: normal Resonance: WFL  ORAL MOTOR EXAMINATION: Overall status: Did not assess due to pt frustration Comments: will assess next session  STANDARDIZED ASSESSMENTS: QAB: To be completed first session  PATIENT REPORTED OUTCOME MEASURES (PROM): Communication Effectiveness Survey: lower scores indicate greater negative impact on pt's QOL.Pt scored himself 18/32, with "1" ("not at all effective") scored on "participating in conversation with strangers in a quiet place". "3" ("mostly effective") scored for "having a conversation with a family member or friends at home", " conversing with a familiar person over the phone", and "having a conversation while traveling in a car". [name] (wife) scored the patient 13/32 with "1" scored on "being part of a conversation in a noisy environment", "speaking to a friend when emotionally upset/angry", and "having a conversation with someone at a distance/across a room". [name] did not score the patient any "3" ("mostly effective"). Pt and wife agreed on "2" for "conversing over the telephone with a stranger".  TREATMENT DATE:   05/05/24: SLP reviewed role of SLP in ST, general goals, discussion about how augmentative items can enhance communication and pt's need to embrace these items if he wants to communicate with people.  PATIENT EDUCATION: Education details: see "treatment date" this date, above Person educated: Patient and Spouse Education method: Explanation, Demonstration, Verbal cues, and Handouts Education comprehension: verbalized understanding, returned demonstration, verbal cues required, and needs further education   GOALS: Goals reviewed with patient? Yes  SHORT TERM GOALS: Target date: 06/06/24  Pt will complete QAB in first session Baseline: Goal status: INITIAL  2.  Pt will use alternative  communication means (low tech) via phone, photo book, or copied onto paper at least once daily between 3 sessions Baseline:  Goal status: INITIAL  3.  Pt will abandon <50% of utterances using compensations in one minute of conversational speech Baseline:  Goal status: INITIAL  4.  Pt will use two words to describe an item on personally relevant word twice in one session Baseline:  Goal status: INITIAL   LONG TERM GOALS: Target date: 07/04/24  Pt will improve PROM compared to initial administration Baseline:  Goal status: INITIAL  2.  Pt will use alternative communication means (low tech) via phone, photo book, or paper at least x5 daily between 2 sessions Baseline:  Goal status: INITIAL  3.  Pt will abandon only 20% of utterances in one minute of conversational speech using compensations  Baseline:  Goal status: INITIAL  4.  Pt will indicate higher frequency of successful communication at baseball games (using compensation) than prior to ST Baseline:  Goal status: INITIAL   ASSESSMENT:  CLINICAL IMPRESSION: Patient is a 73 y.o. M who was seen today for assessment of language in light of PPA diagnosed 2022. Today pt abandoned 85% (45/53) of his utterances in speech samples totaling almost 3 minutes in length. ("I don't know" or "You know" was not counted towards complete utterance count). Pt demonstrated and stated he was frustrated at his inability to communicate effectively, which was corroborated by his lower score in a PROM - QoL measure. Wife's score was lower (13/32) than pt's (18/32), demonstrating possible decr'd awareness and/or receptive language disorder/receptive aphasia as well. This decr in awareness may be somewhat due to a receptive language disorder, which pt also demonstrated in previous course of ST in late 2022-early 2023. To date pt uses very few non-verbal compensations, nor does he use any alternative or augmentative means for communication. It is necessary for  pt to learn more about how to use alternative means of communication in order to improve success and effectiveness of communication at home and in the community. PT has a book of pictures which wife made for pt but he does not use this book, and he requires cues to rarely use gestures to augment expressive verbal communication. SLP observed a great amount of frustration in pt today at his inability to communicate verbally and looked more disappointed when told he may need to learn how to use alternative means to communicate.   OBJECTIVE IMPAIRMENTS: include awareness, expressive language, receptive language, and aphasia. These impairments are limiting patient from managing medications, managing appointments, managing finances, household responsibilities, ADLs/IADLs, and effectively communicating at home and in community. Factors affecting potential to achieve goals and functional outcome are cooperation/participation level, medical prognosis, previous level of function, and severity of impairments. Patient will benefit from skilled SLP services to address above impairments and improve overall function.  REHAB POTENTIAL: Fair given above  factors  PLAN:  SLP FREQUENCY: 1-2x/week  SLP DURATION: 8 weeks  PLANNED INTERVENTIONS: Language facilitation, Environmental controls, Cueing hierachy, Internal/external aids, Functional tasks, Multimodal communication approach, SLP instruction and feedback, Compensatory strategies, Patient/family education, and [id number] Treatment of speech (30 or 45 min)     Aseneth Hack, CCC-SLP 05/05/2024, 9:43 PM

## 2024-05-09 ENCOUNTER — Ambulatory Visit

## 2024-05-09 DIAGNOSIS — F0280 Dementia in other diseases classified elsewhere without behavioral disturbance: Secondary | ICD-10-CM

## 2024-05-09 DIAGNOSIS — G3101 Pick's disease: Secondary | ICD-10-CM | POA: Diagnosis not present

## 2024-05-09 DIAGNOSIS — R4701 Aphasia: Secondary | ICD-10-CM | POA: Diagnosis not present

## 2024-05-09 NOTE — Therapy (Signed)
 OUTPATIENT SPEECH LANGUAGE PATHOLOGY APHASIA TREATMENT   Patient Name: Robert Mann MRN: [id number] DOB:14-Sep-1951, 73 y.o., male Today's Date: 05/09/2024  PCP: Brena Calk, NP REFERRING PROVIDER: Tex Filbert, PA  END OF SESSION:  End of Session - 05/09/24 1227     Visit Number 2    Number of Visits 17    Date for SLP Re-Evaluation 07/04/24    SLP Start Time 0933    SLP Stop Time  1015    SLP Time Calculation (min) 42 min    Activity Tolerance Patient tolerated treatment well;Other (comment)   demonstrated visible frustration with inability to verbally communicate or via written communication          Past Medical History:  Diagnosis Date   Essential hypertension    History of kidney stones    History of melanoma 01/2018   left chest   History of placement of chest tube 10/1979   History of pneumothorax 1980   Hyperlipidemia    Mild neurocognitive disorder, 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   Past Surgical History:  Procedure Laterality Date   MELANOMA EXCISION WITH SENTINEL LYMPH NODE BIOPSY Left 02/08/2018   Procedure: WIDE EXCISION OF LEFT CHEST MELANOMA AND LEFT AXILLARY SENTINEL LYMPH NODE;  Surgeon: Dorena Gander, MD;  Location: [facility] OR;  Service: General;  Laterality: Left;   WISDOM TOOTH EXTRACTION     Patient Active Problem List   Diagnosis Date Noted   Mild neurocognitive disorder, 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 Seen for approx 10 visits of ST in 2022-2023. hypertension, hyperlipidemia, h/o melanoma L chest and L shoulder. 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   PAIN:  Are you having pain? No  FALLS: Has patient fallen in last 6 months?  No   PATIENT GOALS: Improve expressive language  OBJECTIVE:  Note: Objective measures were completed at Evaluation unless otherwise noted.  DIAGNOSTIC FINDINGS:  Robert Mann 04/02/24 Robert Mann is a very pleasant 73 y.o. 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 Patient is currently on donepezil  10 mg daily and memantine  10 mg twice daily.  Memory is stable but he was unable to perform MMSE due to verbal difficulties which became very frustrating to him.  In the past, he has done speech therapy but in view of worsening speech, we discussed referring him again and he agrees .He is able to participate on his ADLs, continues to drive without any issues, mood is good recommend good control of her cardiovascular risk factors    STANDARDIZED ASSESSMENTS: QAB: To be completed first session  PATIENT REPORTED OUTCOME MEASURES (PROM): Communication Effectiveness Survey: lower scores indicate greater negative impact on pt's QOL.Pt scored himself 18/32, with 1 (not at  all effective) scored on participating in conversation with strangers in a quiet place. 3 (mostly effective) scored for having a conversation with a family member or friends at home,  conversing with a familiar person over the phone, and having a conversation while traveling in a car. Robert Mann (wife) scored the patient 13/32 with 1 scored on being part of a  conversation in a noisy environment, speaking to a friend when emotionally upset/angry, and having a conversation with someone at a distance/across a room. Robert Mann did not score the patient any 3 (mostly effective). Pt and wife agreed on 2 for conversing over the telephone with a stranger.                                                                                                                             TREATMENT DATE:   05/09/24: Pt did not complete QAB today due to appearing frustrated about his anomia. Will complete next session. Today SLP practiced compensatory measures with pt (his book of personally relevant words and his phone). SLP req'd to use mod-max A with pt's maps app to show me the restaurants he and wife enjoy. Pt req'd assistance to search appropriately - he would type one letter and would look for AI suggestions - by 4 reps pt was typing in more letters into search bar but cont'd to look at AI and press incorrect location of multiple location restaurants. SLP to cont to work with pt. With his book, he told SLP which people were alive or deceased, using the printed word 80% of the time.   05/05/24: SLP reviewed role of SLP in ST, general goals, discussion about how augmentative items can enhance communication and pt's need to embrace these items if he wants to communicate with people.  PATIENT EDUCATION: Education details: see treatment date this date, above Person educated: Patient and Spouse Education method: Explanation, Facilities manager, Verbal cues, and Handouts Education comprehension: verbalized understanding, returned demonstration, verbal cues required, and needs further education   GOALS: Goals reviewed with patient? Yes  SHORT TERM GOALS: Target date: 06/06/24  Pt will complete QAB in first session Baseline: Goal status: INITIAL  2.  Pt will use alternative communication means (low tech) via phone, photo book, or copied onto paper at least once  daily between 3 sessions Baseline:  Goal status: INITIAL  3.  Pt will abandon <50% of utterances using compensations in one minute of conversational speech Baseline:  Goal status: INITIAL  4.  Pt will use two words to describe an item on personally relevant word twice in one session Baseline:  Goal status: INITIAL   LONG TERM GOALS: Target date: 07/04/24  Pt will improve PROM compared to initial administration Baseline:  Goal status: INITIAL  2.  Pt will use alternative communication means (low tech) via phone, photo book, or paper at least x5 daily between 2 sessions Baseline:  Goal status: INITIAL  3.  Pt will abandon only 20% of utterances  in one minute of conversational speech using compensations  Baseline:  Goal status: INITIAL  4.  Pt will indicate higher frequency of successful communication at baseball games (using compensation) than prior to ST Baseline:  Goal status: INITIAL   ASSESSMENT:  CLINICAL IMPRESSION: Patient is a 73 y.o. M who was seen today for treatment of language in light of PPA diagnosed 2022. See treatment date above for today's date for further details on today's session. To date pt uses very few non-verbal compensations, nor does he use any alternative or augmentative means for communication. It is necessary for pt to learn more about how to use alternative means of communication in order to improve success and effectiveness of communication at home and in the community. SLP cont'd to observed frustration in pt today at his inability to communicate verbally and looked disappointed when SLP told pt to bring photo book to next session.   OBJECTIVE IMPAIRMENTS: include awareness, expressive language, receptive language, and aphasia. These impairments are limiting patient from managing medications, managing appointments, managing finances, household responsibilities, ADLs/IADLs, and effectively communicating at home and in community. Factors affecting  potential to achieve goals and functional outcome are cooperation/participation level, medical prognosis, previous level of function, and severity of impairments. Patient will benefit from skilled SLP services to address above impairments and improve overall function.  REHAB POTENTIAL: Fair given above factors  PLAN:  SLP FREQUENCY: 1-2x/week  SLP DURATION: 8 weeks  PLANNED INTERVENTIONS: Language facilitation, Environmental controls, Cueing hierachy, Internal/external aids, Functional tasks, Multimodal communication approach, SLP instruction and feedback, Compensatory strategies, Patient/family education, and [id number] Treatment of speech (30 or 45 min)     Tamecka Milham, CCC-SLP 05/09/2024, 12:28 PM

## 2024-05-12 ENCOUNTER — Ambulatory Visit

## 2024-05-12 DIAGNOSIS — R4701 Aphasia: Secondary | ICD-10-CM | POA: Diagnosis not present

## 2024-05-12 DIAGNOSIS — G3101 Pick's disease: Secondary | ICD-10-CM | POA: Diagnosis not present

## 2024-05-12 DIAGNOSIS — F0280 Dementia in other diseases classified elsewhere without behavioral disturbance: Secondary | ICD-10-CM | POA: Diagnosis not present

## 2024-05-12 NOTE — Therapy (Signed)
 OUTPATIENT SPEECH LANGUAGE PATHOLOGY APHASIA TREATMENT   Patient Name: Robert Mann MRN: [id number] DOB:10-11-1951, 73 y.o., male Today's Date: 05/12/2024  PCP: Brena Calk, NP REFERRING PROVIDER: Tex Filbert, PA  END OF SESSION:  End of Session - 05/12/24 1450     Visit Number 3    Number of Visits 17    Date for SLP Re-Evaluation 07/04/24    SLP Start Time 1448    SLP Stop Time  1530    SLP Time Calculation (min) 42 min    Activity Tolerance Patient tolerated treatment well;Other (comment)   demonstrated visible frustration with inability to verbally communicate or via written communication          Past Medical History:  Diagnosis Date   Essential hypertension    History of kidney stones    History of melanoma 01/2018   left chest   History of placement of chest tube 10/1979   History of pneumothorax 1980   Hyperlipidemia    Mild neurocognitive disorder, 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   Past Surgical History:  Procedure Laterality Date   MELANOMA EXCISION WITH SENTINEL LYMPH NODE BIOPSY Left 02/08/2018   Procedure: WIDE EXCISION OF LEFT CHEST MELANOMA AND LEFT AXILLARY SENTINEL LYMPH NODE;  Surgeon: Dorena Gander, MD;  Location: [facility] OR;  Service: General;  Laterality: Left;   WISDOM TOOTH EXTRACTION     Patient Active Problem List   Diagnosis Date Noted   Mild neurocognitive disorder, 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 Seen for approx 10 visits of ST in 2022-2023. hypertension, hyperlipidemia, h/o melanoma L chest and L shoulder. 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   PAIN:  Are you having pain? No  FALLS: Has patient fallen in last 6 months?  No   PATIENT GOALS: Improve expressive language  OBJECTIVE:  Note: Objective measures were completed at Evaluation unless otherwise noted.  DIAGNOSTIC FINDINGS:  Robert Mann 04/02/24 Robert Mann is a very pleasant 73 y.o. 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 Patient is currently on donepezil  10 mg daily and memantine  10 mg twice daily.  Memory is stable but he was unable to perform MMSE due to verbal difficulties which became very frustrating to him.  In the past, he has done speech therapy but in view of worsening speech, we discussed referring him again and he agrees .He is able to participate on his ADLs, continues to drive without any issues, mood is good recommend good control of her cardiovascular risk factors    STANDARDIZED ASSESSMENTS: QAB: To be completed first session  PATIENT REPORTED OUTCOME MEASURES (PROM): Communication Effectiveness Survey: lower scores indicate greater negative impact on pt's QOL.Pt scored himself 18/32, with 1 (not  at all effective) scored on participating in conversation with strangers in a quiet place. 3 (mostly effective) scored for having a conversation with a family member or friends at home,  conversing with a familiar person over the phone, and having a conversation while traveling in a car. Robert Mann (wife) scored the patient 13/32 with 1 scored on  being part of a conversation in a noisy environment, speaking to a friend when emotionally upset/angry, and having a conversation with someone at a distance/across a room. Robert Mann did not score the patient any 3 (mostly effective). Pt and wife agreed on 2 for conversing over the telephone with a stranger.                                                                                                                             TREATMENT DATE:   05/12/24: Pt with abandoned utterances throughout session - generated 2-4 words and then stopped. Tried to rephrase but often could  not do so. SLP worked with pt on his picture book. Pt stated concrete ideas with the book more successfully than without. Reading the signs on the pictures was helpful but not failsafe - apraxia proved difficult for pt to overcome today. SLP cont to tell pt he will need to use some sort of alternative/augmentative communication device and again explained why for pt.  05/09/24: Pt did not complete QAB today due to appearing frustrated about his anomia. Will complete next session. Today SLP practiced compensatory measures with pt (his book of personally relevant words and his phone). SLP req'd to use mod-max A with pt's maps app to show me the restaurants he and wife enjoy. Pt req'd assistance to search appropriately - he would type one letter and would look for AI suggestions - by 4 reps pt was typing in more letters into search bar but cont'd to look at AI and press incorrect location of multiple location restaurants. SLP to cont to work with pt. With his book, he told SLP which people were alive or deceased, using the printed word 80% of the time.   05/05/24: SLP reviewed role of SLP in ST, general goals, discussion about how augmentative items can enhance communication and pt's need to embrace these items if he wants to communicate with people.  PATIENT EDUCATION: Education details: see treatment date this date,  above Person educated: Patient and Spouse Education method: Explanation, Facilities manager, Verbal cues, and Handouts Education comprehension: verbalized understanding, returned demonstration, verbal cues required, and needs further education   GOALS: Goals reviewed with patient? Yes  SHORT TERM GOALS: Target date: 06/06/24  Pt will complete QAB prior to 05/26/24 Baseline: Goal status: INITIAL  2.  Pt will use alternative communication means (low tech) via phone, photo book, or copied onto paper at least once daily between 3 sessions Baseline:  Goal status: INITIAL  3.  Pt will abandon <50% of utterances using compensations in one minute of conversational speech Baseline:  Goal status:  INITIAL  4.  Pt will use two words to describe an item on personally relevant word twice in one session Baseline:  Goal status: INITIAL   LONG TERM GOALS: Target date: 07/04/24  Pt will improve PROM compared to initial administration Baseline:  Goal status: INITIAL  2.  Pt will use alternative communication means (low tech) via phone, photo book, or paper at least x5 daily between 2 sessions Baseline:  Goal status: INITIAL  3.  Pt will abandon only 20% of utterances in one minute of conversational speech using compensations  Baseline:  Goal status: INITIAL  4.  Pt will indicate higher frequency of successful communication at baseball games (using compensation) than prior to ST Baseline:  Goal status: INITIAL   ASSESSMENT:  CLINICAL IMPRESSION: SLP modified STG for QAB due to developing rapport with pt. Patient is a 73 y.o. M who was seen today for treatment of language in light of PPA diagnosed 2022. See treatment date above for today's date for further details on today's session. To date pt uses very few non-verbal compensations, nor does he use any alternative or augmentative means for communication. It is necessary for pt to learn more about how to use alternative means of communication  in order to improve success and effectiveness of communication at home and in the community. SLP cont'd to observed frustration in pt today at his inability to communicate verbally and looked disappointed when SLP told pt to bring photo book to next session.   OBJECTIVE IMPAIRMENTS: include awareness, expressive language, receptive language, and aphasia. These impairments are limiting patient from managing medications, managing appointments, managing finances, household responsibilities, ADLs/IADLs, and effectively communicating at home and in community. Factors affecting potential to achieve goals and functional outcome are cooperation/participation level, medical prognosis, previous level of function, and severity of impairments. Patient will benefit from skilled SLP services to address above impairments and improve overall function.  REHAB POTENTIAL: Fair given above factors  PLAN:  SLP FREQUENCY: 1-2x/week  SLP DURATION: 8 weeks  PLANNED INTERVENTIONS: Language facilitation, Environmental controls, Cueing hierachy, Internal/external aids, Functional tasks, Multimodal communication approach, SLP instruction and feedback, Compensatory strategies, Patient/family education, and [id number] Treatment of speech (30 or 45 min)     Aviv Lengacher, CCC-SLP 05/12/2024, 2:51 PM

## 2024-05-14 ENCOUNTER — Encounter

## 2024-05-16 ENCOUNTER — Ambulatory Visit

## 2024-05-16 DIAGNOSIS — F0280 Dementia in other diseases classified elsewhere without behavioral disturbance: Secondary | ICD-10-CM | POA: Diagnosis not present

## 2024-05-16 DIAGNOSIS — R4701 Aphasia: Secondary | ICD-10-CM | POA: Diagnosis not present

## 2024-05-16 DIAGNOSIS — G3101 Pick's disease: Secondary | ICD-10-CM | POA: Diagnosis not present

## 2024-05-16 NOTE — Therapy (Signed)
 OUTPATIENT SPEECH LANGUAGE PATHOLOGY APHASIA TREATMENT   Patient Name: Robert Mann MRN: [id number] DOB:1951/09/15, 73 y.o., male Today's Date: 05/16/2024  PCP: Brena Calk, NP REFERRING PROVIDER: Tex Filbert, PA  END OF SESSION:  End of Session - 05/16/24 1107     Visit Number 4    Number of Visits 17    Date for SLP Re-Evaluation 07/04/24    SLP Start Time 1105    SLP Stop Time  1145    SLP Time Calculation (min) 40 min    Activity Tolerance Patient tolerated treatment well           Past Medical History:  Diagnosis Date   Essential hypertension    History of kidney stones    History of melanoma 01/2018   left chest   History of placement of chest tube 10/1979   History of pneumothorax 1980   Hyperlipidemia    Mild neurocognitive disorder, 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   Past Surgical History:  Procedure Laterality Date   MELANOMA EXCISION WITH SENTINEL LYMPH NODE BIOPSY Left 02/08/2018   Procedure: WIDE EXCISION OF LEFT CHEST MELANOMA AND LEFT AXILLARY SENTINEL LYMPH NODE;  Surgeon: Dorena Gander, MD;  Location[facility] OR;  Service: General;  Laterality: Left;   WISDOM TOOTH EXTRACTION     Patient Active Problem List   Diagnosis Date Noted   Mild neurocognitive disorder, 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 Seen for approx 10 visits of ST in 2022-2023. hypertension, hyperlipidemia, h/o melanoma L chest and L shoulder. 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   PAIN:  Are you having pain? No  FALLS: Has patient fallen in last 6 months?  No   PATIENT GOALS: Improve expressive language  OBJECTIVE:  Note: Objective measures were completed at Evaluation unless otherwise noted.  DIAGNOSTIC FINDINGS:  Robert Mann 04/02/24 Robert Mann is a very pleasant 73 y.o. 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 Patient is currently on donepezil  10 mg daily and memantine  10 mg twice daily.  Memory is stable but he was unable to perform MMSE due to verbal difficulties which became very frustrating to him.  In the past, he has done speech therapy but in view of worsening speech, we discussed referring him again and he agrees .He is able to participate on his ADLs, continues to drive without any issues, mood is good recommend good control of her cardiovascular risk factors    STANDARDIZED ASSESSMENTS: QAB: To be completed first session  PATIENT REPORTED OUTCOME MEASURES (PROM): Communication Effectiveness Survey: lower scores indicate greater negative impact on pt's QOL.Pt scored himself 18/32,  with 1 (not at all effective) scored on participating in conversation with strangers in a quiet place. 3 (mostly effective) scored for having a conversation with a family member or friends at home,  conversing with a familiar person over the phone, and having a conversation while traveling in a car. Robert Mann (wife) scored the patient 13/32 with 1 scored on being part of a conversation in a noisy environment,  speaking to a friend when emotionally upset/angry, and having a conversation with someone at a distance/across a room. Robert Mann did not score the patient any 3 (mostly effective). Pt and wife agreed on 2 for conversing over the telephone with a stranger.                                                                                                                             TREATMENT DATE:   05/16/24: Pt entered stating to SLP upon questioning that he has used his picture/AAC book with [place] in the last week. SLP strongly encouraged pt to use book even for beginning with reading his target word/s as he indicated he only uses book when he absolutely has to. SLP told pt to use book for practice verbalizing target words and explained rationale. SLP worked with pt with family relationships. Pt error awareness with paraphasic semantic errors was only 20%. SLP worked with pt to write down word cues for family relationships and place them in his book for use in future. Prior to using book pt answered questions re: family 20% of the time with correct answers 10%. With his book, pt answered questions 5/5 of the time with correct answers 5/5 with min extra time. SLP used this comparison to encourage pt to use book as much as possible.  05/12/24: Pt with abandoned utterances throughout session - generated 2-4 words and then stopped. Tried to rephrase but often could  not do so. SLP worked with pt on his picture book. Pt stated concrete ideas with the book more successfully than without. Reading the signs on the pictures was helpful but not failsafe - apraxia proved difficult for pt to overcome today. SLP cont to tell pt he will need to use some sort of alternative/augmentative communication device and again explained why for pt.  05/09/24: Pt did not complete QAB today due to appearing frustrated about his anomia. Will complete next session. Today SLP practiced compensatory measures with pt (his book of  personally relevant words and his phone). SLP req'd to use mod-max A with pt's maps app to show me the restaurants he and wife enjoy. Pt req'd assistance to search appropriately - he would type one letter and would look for AI suggestions - by 4 reps pt was typing in more letters into search bar but cont'd to look at AI and press incorrect location of multiple location restaurants. SLP to cont to work with pt. With his book, he told SLP which people were alive or deceased, using the printed word  80% of the time.   05/05/24: SLP reviewed role of SLP in ST, general goals, discussion about how augmentative items can enhance communication and pt's need to embrace these items if he wants to communicate with people.  PATIENT EDUCATION: Education details: see treatment date this date, above Person educated: Patient and Spouse Education method: Explanation, Facilities manager, Verbal cues, and Handouts Education comprehension: verbalized understanding, returned demonstration, verbal cues required, and needs further education   GOALS: Goals reviewed with patient? Yes  SHORT TERM GOALS: Target date: 06/06/24  Pt will complete QAB prior to 05/26/24 Baseline: Goal status: INITIAL  2.  Pt will use alternative communication means (low tech) via phone, photo book, or copied onto paper at least once daily between 3 sessions Baseline: 05/16/24 Goal status: INITIAL  3.  Pt will abandon <50% of utterances using compensations in one minute of conversational speech Baseline:  Goal status: INITIAL  4.  Pt will use two words to describe an item on personally relevant word list twice in one session Baseline:  Goal status: deferred due to picture book use more important at this time   LONG TERM GOALS: Target date: 07/04/24  Pt will improve PROM compared to initial administration Baseline:  Goal status: INITIAL  2.  Pt will use alternative communication means (low tech) via phone, photo book, or paper at least  x5 daily between 2 sessions Baseline:  Goal status: INITIAL  3.  Pt will abandon only 20% of utterances in one minute of conversational speech using compensations  Baseline:  Goal status: INITIAL  4.  Pt will indicate higher frequency of successful communication at baseball games (using compensation) than prior to ST Baseline:  Goal status: INITIAL   ASSESSMENT:  CLINICAL IMPRESSION: Patient is a 73 y.o. M who was seen today for treatment of language in light of PPA diagnosed 2022. See treatment date above for today's date for further details on today's session. It appears after today pt is warming to use of alternative or augmentative means for communication. It is necessary for pt to learn more about how to use alternative means of communication in order to improve success and effectiveness of communication at home and in the community. SLP cont'd to observed frustration in pt today at his inability to communicate verbally and looked disappointed when SLP told pt to bring photo book to next session.   OBJECTIVE IMPAIRMENTS: include awareness, expressive language, receptive language, and aphasia. These impairments are limiting patient from managing medications, managing appointments, managing finances, household responsibilities, ADLs/IADLs, and effectively communicating at home and in community. Factors affecting potential to achieve goals and functional outcome are cooperation/participation level, medical prognosis, previous level of function, and severity of impairments. Patient will benefit from skilled SLP services to address above impairments and improve overall function.  REHAB POTENTIAL: Fair given above factors  PLAN:  SLP FREQUENCY: 1-2x/week  SLP DURATION: 8 weeks  PLANNED INTERVENTIONS: Language facilitation, Environmental controls, Cueing hierachy, Internal/external aids, Functional tasks, Multimodal communication approach, SLP instruction and feedback, Compensatory  strategies, Patient/family education, and [id number] Treatment of speech (30 or 45 min)     Estel Tonelli, CCC-SLP 05/16/2024, 11:07 AM

## 2024-05-20 ENCOUNTER — Ambulatory Visit

## 2024-05-20 DIAGNOSIS — G3101 Pick's disease: Secondary | ICD-10-CM | POA: Diagnosis not present

## 2024-05-20 DIAGNOSIS — F0280 Dementia in other diseases classified elsewhere without behavioral disturbance: Secondary | ICD-10-CM

## 2024-05-20 DIAGNOSIS — R4701 Aphasia: Secondary | ICD-10-CM

## 2024-05-20 NOTE — Therapy (Signed)
 OUTPATIENT SPEECH LANGUAGE PATHOLOGY APHASIA TREATMENT   Patient Name: Robert Mann MRN: [id number] DOB:10-17-1951, 73 y.o., male Today's Date: 05/20/2024  PCP: Tonye Huxley, NP REFERRING PROVIDER: Dina Credit, PA  END OF SESSION:  End of Session - 05/20/24 1614     Visit Number 5    Number of Visits 17    Date for SLP Re-Evaluation 07/04/24    SLP Start Time 1535    SLP Stop Time  1613    SLP Time Calculation (min) 38 min    Activity Tolerance Patient tolerated treatment well           Past Medical History:  Diagnosis Date   Essential hypertension    History of kidney stones    History of melanoma 01/2018   left chest   History of placement of chest tube 10/1979   History of pneumothorax 1980   Hyperlipidemia    Mild neurocognitive disorder, 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   Past Surgical History:  Procedure Laterality Date   MELANOMA EXCISION WITH SENTINEL LYMPH NODE BIOPSY Left 02/08/2018   Procedure: WIDE EXCISION OF LEFT CHEST MELANOMA AND LEFT AXILLARY SENTINEL LYMPH NODE;  Surgeon: Sebastian Moles, MD;  Location: [facility] OR;  Service: General;  Laterality: Left;   WISDOM TOOTH EXTRACTION     Patient Active Problem List   Diagnosis Date Noted   Mild neurocognitive disorder, 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 Seen for approx 10 visits of ST in 2022-2023. hypertension, hyperlipidemia, h/o melanoma L chest and L shoulder. 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   PAIN:  Are you having pain? No  FALLS: Has patient fallen in last 6 months?  No   PATIENT GOALS: Improve expressive language  OBJECTIVE:  Note: Objective measures were completed at Evaluation unless otherwise noted.  DIAGNOSTIC FINDINGS:  DINA 04/02/24 Charlie [name] Radermacher is a very pleasant 73 y.o. 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 Patient is currently on donepezil  10 mg daily and memantine  10 mg twice daily.  Memory is stable but he was unable to perform MMSE due to verbal difficulties which became very frustrating to him.  In the past, he has done speech therapy but in view of worsening speech, we discussed referring him again and he agrees .He is able to participate on his ADLs, continues to drive without any issues, mood is good recommend good control of her cardiovascular risk factors    STANDARDIZED ASSESSMENTS: QAB: To be completed first session  PATIENT REPORTED OUTCOME MEASURES (PROM): Communication Effectiveness Survey: lower scores indicate greater negative impact on pt's QOL.Pt scored himself 18/32,  with 1 (not at all effective) scored on participating in conversation with strangers in a quiet place. 3 (mostly effective) scored for having a conversation with a family member or friends at home,  conversing with a familiar person over the phone, and having a conversation while traveling in a car. Robert Mann (wife) scored the patient 13/32 with 1 scored on being part of a conversation in a noisy environment,  speaking to a friend when emotionally upset/angry, and having a conversation with someone at a distance/across a room. Robert Mann did not score the patient any 3 (mostly effective). Pt and wife agreed on 2 for conversing over the telephone with a stranger.                                                                                                                             TREATMENT DATE:   05/20/24: Pt explained to SLP using his book that he communicated with Robert Mann since last session, about desire to go to [place], with his book. SLP worked with pt increasing his familiarity with his AAC book, asking him questions he would need to use the book for. He used book correctly 3/3, with extra time. He attempted to tell SLP something about food/drink at [employer] but did not have the necessary pictures in his book. SLP strongly suggested pt have a page for [employer] - pt agreed. SLP added hot and cold weather pictures for pt to communicate weather terms using his book.   05/16/24: Pt entered stating to SLP upon questioning that he has used his picture/AAC book with [place] in the last week. SLP strongly encouraged pt to use book even for beginning with reading his target word/s as he indicated he only uses book when he absolutely has to. SLP told pt to use book for practice verbalizing target words and explained rationale. SLP worked with pt with family relationships. Pt error awareness with paraphasic semantic errors was only 20%. SLP worked with pt to write down word cues for family relationships and place them in his book for use in future. Prior to using book pt answered questions re: family 20% of the time with correct answers 10%. With his book, pt answered questions 5/5 of the time with correct answers 5/5 with min extra time. SLP used this comparison to encourage pt to use book as much as possible.  05/12/24: Pt with abandoned utterances throughout session -  generated 2-4 words and then stopped. Tried to rephrase but often could  not do so. SLP worked with pt on his picture book. Pt stated concrete ideas with the book more successfully than without. Reading the signs on the pictures was helpful but not failsafe - apraxia proved difficult for pt to overcome today. SLP cont to tell pt he will need to use some sort of alternative/augmentative communication device and again explained why for pt.  05/09/24: Pt did not complete QAB today due to appearing frustrated about his  anomia. Will complete next session. Today SLP practiced compensatory measures with pt (his book of personally relevant words and his phone). SLP req'd to use mod-max A with pt's maps app to show me the restaurants he and wife enjoy. Pt req'd assistance to search appropriately - he would type one letter and would look for AI suggestions - by 4 reps pt was typing in more letters into search bar but cont'd to look at AI and press incorrect location of multiple location restaurants. SLP to cont to work with pt. With his book, he told SLP which people were alive or deceased, using the printed word 80% of the time.   05/05/24: SLP reviewed role of SLP in ST, general goals, discussion about how augmentative items can enhance communication and pt's need to embrace these items if he wants to communicate with people.  PATIENT EDUCATION: Education details: see treatment date this date, above Person educated: Patient and Spouse Education method: Explanation, Facilities manager, Verbal cues, and Handouts Education comprehension: verbalized understanding, returned demonstration, verbal cues required, and needs further education   GOALS: Goals reviewed with patient? Yes  SHORT TERM GOALS: Target date: 06/06/24  Pt will complete QAB prior to 05/26/24 Baseline: Goal status: INITIAL  2.  Pt will use alternative communication means (low tech) via phone, photo book, or copied onto paper at least once daily  between 3 sessions Baseline: 05/16/24, 05/20/24 Goal status: INITIAL  3.  Pt will abandon <50% of utterances using compensations in one minute of conversational speech Baseline:  Goal status: met  4.  Pt will use two words to describe an item on personally relevant word list twice in one session Baseline:  Goal status: deferred due to picture book use more important at this time   LONG TERM GOALS: Target date: 07/04/24  Pt will improve PROM compared to initial administration Baseline:  Goal status: INITIAL  2.  Pt will use alternative communication means (low tech) via phone, photo book, or paper at least x5 daily between 2 sessions Baseline:  Goal status: INITIAL  3.  Pt will abandon only 20% of utterances in one minute of conversational speech using compensations  Baseline:  Goal status: INITIAL  4.  Pt will indicate higher frequency of successful communication at baseball games (using compensation) than prior to ST Baseline:  Goal status: INITIAL   ASSESSMENT:  CLINICAL IMPRESSION: Patient is a 73 y.o. M who was seen today for treatment of language in light of PPA diagnosed 2022. See treatment date above for today's date for further details on today's session. It cont to look like pt is warming to use of alternative or augmentative means for communication. It is necessary for pt to learn more about how to use alternative means of communication in order to improve success and effectiveness of communication at home and in the community. SLP observed less frustration in pt today at his inability to communicate verbally.   OBJECTIVE IMPAIRMENTS: include awareness, expressive language, receptive language, and aphasia. These impairments are limiting patient from managing medications, managing appointments, managing finances, household responsibilities, ADLs/IADLs, and effectively communicating at home and in community. Factors affecting potential to achieve goals and functional  outcome are cooperation/participation level, medical prognosis, previous level of function, and severity of impairments. Patient will benefit from skilled SLP services to address above impairments and improve overall function.  REHAB POTENTIAL: Fair given above factors  PLAN:  SLP FREQUENCY: 1-2x/week  SLP DURATION: 8 weeks  PLANNED INTERVENTIONS: Language facilitation, Environmental controls, Cueing  hierachy, Internal/external aids, Functional tasks, Multimodal communication approach, SLP instruction and feedback, Compensatory strategies, Patient/family education, and 07492 Treatment of speech (30 or 45 min)     Nicolaus Andel, CCC-SLP 05/20/2024, 4:17 PM

## 2024-05-28 ENCOUNTER — Encounter

## 2024-06-04 ENCOUNTER — Encounter

## 2024-06-09 ENCOUNTER — Ambulatory Visit: Attending: Physician Assistant

## 2024-06-09 DIAGNOSIS — R4701 Aphasia: Secondary | ICD-10-CM | POA: Insufficient documentation

## 2024-06-09 DIAGNOSIS — F0280 Dementia in other diseases classified elsewhere without behavioral disturbance: Secondary | ICD-10-CM | POA: Diagnosis not present

## 2024-06-09 DIAGNOSIS — G3101 Pick's disease: Secondary | ICD-10-CM | POA: Insufficient documentation

## 2024-06-09 NOTE — Therapy (Signed)
 OUTPATIENT SPEECH LANGUAGE PATHOLOGY APHASIA TREATMENT   Patient Name: Robert Mann MRN: [id number] DOB:[birth year], 73 y.o., male Today's Date: 06/09/2024  PCP: Tonye Huxley, NP REFERRING PROVIDER: Dina Credit, PA  END OF SESSION:  End of Session - 06/09/24 1716     Visit Number 6    Number of Visits 17    Date for SLP Re-Evaluation 07/04/24    SLP Start Time 0935    SLP Stop Time  1015    SLP Time Calculation (min) 40 min    Activity Tolerance Patient tolerated treatment well            Past Medical History:  Diagnosis Date   Essential hypertension    History of kidney stones    History of melanoma 01/2018   left chest   History of placement of chest tube 10/1979   History of pneumothorax 1980   Hyperlipidemia    Mild neurocognitive disorder, 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   Past Surgical History:  Procedure Laterality Date   MELANOMA EXCISION WITH SENTINEL LYMPH NODE BIOPSY Left 02/08/2018   Procedure: WIDE EXCISION OF LEFT CHEST MELANOMA AND LEFT AXILLARY SENTINEL LYMPH NODE;  Surgeon: Sebastian Moles, MD;  Location: [facility] OR;  Service: General;  Laterality: Left;   WISDOM TOOTH EXTRACTION     Patient Active Problem List   Diagnosis Date Noted   Mild neurocognitive disorder, 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 My neighbor - (gestures) those going off - loud.  Pt accompanied by: self  PERTINENT HISTORY:  Pt diagnosed with PPA in 2022 after neuropsych  eval. Seen for approx 10 visits of ST in 2022-2023. hypertension, hyperlipidemia, h/o melanoma L chest and L shoulder. 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   PAIN:  Are you having pain? No  FALLS: Has patient fallen in last 6 months?  No   PATIENT GOALS: Improve expressive language  OBJECTIVE:  Note: Objective measures were completed at Evaluation unless otherwise noted.  DIAGNOSTIC FINDINGS:  DINA 04/02/24 [name] is a very pleasant 73 y.o. 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 Patient is currently on donepezil  10 mg daily and memantine  10 mg twice daily.  Memory is stable but he was unable to perform MMSE due to verbal difficulties which became very frustrating to him.  In the past, he has done speech therapy but in view of worsening speech, we discussed referring him again and he agrees .He is able to participate on his ADLs, continues to drive without any issues, mood is good recommend good control of her cardiovascular risk factors    STANDARDIZED ASSESSMENTS: QAB: To be completed first session  PATIENT REPORTED OUTCOME MEASURES (PROM): Communication Effectiveness Survey: lower scores indicate greater negative impact on pt's QOL.Pt scored himself 18/32, with 1 (not at all effective) scored  on participating in conversation with strangers in a quiet place. 3 (mostly effective) scored for having a conversation with a family member or friends at home,  conversing with a familiar person over the phone, and having a conversation while traveling in a car. [name] (wife) scored the patient 13/32 with 1 scored on being part of a conversation in a noisy environment, speaking to a friend  when emotionally upset/angry, and having a conversation with someone at a distance/across a room. [name] did not score the patient any 3 (mostly effective). Pt and wife agreed on 2 for conversing over the telephone with a stranger.                                                                                                                             TREATMENT DATE:   06/09/24: Pt functionally explained about 4th of July fireworks by one of his neighbors with keywords and gestures, upon SLP questinoing about his July 4th.  Today, SLP asked pt questions and pt answered using his low tech AAC with occasional SLP cues repeating the question 4/4. Pt also successful using keywords, and extra time for 5 other statements pt made 2/5.  SLP had to cue pt to use book and this improved success to 3/5. Pt endorses using his low-tech AAC more often at home than prior to ST.  SLP asacertained today that pt uses menu board to order by reading off what he likes at the stadium, and commends common/rote words phrases (yeah it was, yes, etc) on others' comments at the stadium. He denies requiring a page for [employer]. SLP had pt add 2% and slice (by copying SLP written words) to his book today for more precise verbalization PRN. SLP encouraged pt to invite wife to next session.  05/20/24: Pt explained to SLP using his book that he communicated with [name] since last session, about desire to go to [place], with his book. SLP worked with pt increasing his familiarity with his AAC book, asking him questions he would need to use the book for. He used book correctly 3/3, with extra time. He attempted to tell SLP something about food/drink at [employer] but did not have the necessary pictures in his book. SLP strongly suggested pt have a page for [employer] - pt agreed. SLP added hot and cold weather pictures for pt to communicate weather terms using his book.   05/16/24: Pt  entered stating to SLP upon questioning that he has used his picture/AAC book with [place] in the last week. SLP strongly encouraged pt to use book even for beginning with reading his target word/s as he indicated he only uses book when he absolutely has to. SLP told pt to use book for practice verbalizing target words and explained rationale. SLP worked with pt with family relationships. Pt error awareness with paraphasic semantic errors was only 20%. SLP worked with pt to write down word cues for family relationships  and place them in his book for use in future. Prior to using book pt answered questions re: family 20% of the time with correct answers 10%. With his book, pt answered questions 5/5 of the time with correct answers 5/5 with min extra time. SLP used this comparison to encourage pt to use book as much as possible.  05/12/24: Pt with abandoned utterances throughout session - generated 2-4 words and then stopped. Tried to rephrase but often could  not do so. SLP worked with pt on his picture book. Pt stated concrete ideas with the book more successfully than without. Reading the signs on the pictures was helpful but not failsafe - apraxia proved difficult for pt to overcome today. SLP cont to tell pt he will need to use some sort of alternative/augmentative communication device and again explained why for pt.  05/09/24: Pt did not complete QAB today due to appearing frustrated about his anomia. Will complete next session. Today SLP practiced compensatory measures with pt (his book of personally relevant words and his phone). SLP req'd to use mod-max A with pt's maps app to show me the restaurants he and wife enjoy. Pt req'd assistance to search appropriately - he would type one letter and would look for AI suggestions - by 4 reps pt was typing in more letters into search bar but cont'd to look at AI and press incorrect location of multiple location restaurants. SLP to cont to work with pt. With his  book, he told SLP which people were alive or deceased, using the printed word 80% of the time.   05/05/24: SLP reviewed role of SLP in ST, general goals, discussion about how augmentative items can enhance communication and pt's need to embrace these items if he wants to communicate with people.  PATIENT EDUCATION: Education details: see treatment date this date, above Person educated: Patient and Spouse Education method: Explanation, Facilities manager, Verbal cues, and Handouts Education comprehension: verbalized understanding, returned demonstration, verbal cues required, and needs further education   GOALS: Goals reviewed with patient? Yes  SHORT TERM GOALS: Target date: 06/06/24  Pt will complete QAB prior to 05/26/24 Baseline: Goal status: not met - pt does not desire standardized testing for aphasia  2.  Pt will use alternative communication means (low tech) via phone, photo book, or copied onto paper at least once daily between 3 sessions Baseline: 05/16/24, 05/20/24 Goal status: partially met  3.  Pt will abandon <50% of utterances using compensations in one minute of conversational speech Baseline:  Goal status: met  4.  Pt will use two words to describe an item on personally relevant word list twice in one session Baseline:  Goal status: deferred due to picture book use more important at this time   LONG TERM GOALS: Target date: 07/04/24  Pt will improve PROM compared to initial administration Baseline:  Goal status: INITIAL  2.  Pt will use alternative communication means (low tech) via phone, photo book, or paper at least x5 daily between 2 sessions Baseline:  Goal status: INITIAL  3.  Pt will abandon only 20% of utterances in one minute of conversational speech using compensations  Baseline:  Goal status: INITIAL  4.  Pt will indicate higher frequency of successful communication at baseball games (using compensation) than prior to ST Baseline:  Goal status:  INITIAL   ASSESSMENT:  CLINICAL IMPRESSION: Patient is a 73 y.o. M who was seen today for treatment of language in light of PPA diagnosed 2022. Pt does  not desire standardized testing for aphasia - STG not met. See treatment date above for today's date for further details on today's session. Pt cont to report use of alternative or augmentative means for communication at home. It is necessary for pt to learn more about how to use alternative means of communication in order to improve success and effectiveness of communication at home and in the community.   OBJECTIVE IMPAIRMENTS: include awareness, expressive language, receptive language, and aphasia. These impairments are limiting patient from managing medications, managing appointments, managing finances, household responsibilities, ADLs/IADLs, and effectively communicating at home and in community. Factors affecting potential to achieve goals and functional outcome are cooperation/participation level, medical prognosis, previous level of function, and severity of impairments. Patient will benefit from skilled SLP services to address above impairments and improve overall function.  REHAB POTENTIAL: Fair given above factors  PLAN:  SLP FREQUENCY: 1-2x/week  SLP DURATION: 8 weeks  PLANNED INTERVENTIONS: Language facilitation, Environmental controls, Cueing hierachy, Internal/external aids, Functional tasks, Multimodal communication approach, SLP instruction and feedback, Compensatory strategies, Patient/family education, and 07492 Treatment of speech (30 or 45 min)     Raeven Pint, CCC-SLP 06/09/2024, 5:17 PM

## 2024-06-11 ENCOUNTER — Telehealth: Payer: Self-pay

## 2024-06-11 ENCOUNTER — Ambulatory Visit

## 2024-06-11 NOTE — Telephone Encounter (Signed)
  Speech Therapy - no show SLP called pt's preferred telephone number as shown in [facility] and left message notifying pt of no-show today for speech therapy. SLP provided day and time of pt's next appointment.  Lupita Connor, MS, SLP   [facility] at [facility] [address] [place], [place] [address]  P: [phone] F: [phone]

## 2024-06-16 ENCOUNTER — Ambulatory Visit

## 2024-06-16 DIAGNOSIS — R4701 Aphasia: Secondary | ICD-10-CM

## 2024-06-16 DIAGNOSIS — F0280 Dementia in other diseases classified elsewhere without behavioral disturbance: Secondary | ICD-10-CM

## 2024-06-16 DIAGNOSIS — G3101 Pick's disease: Secondary | ICD-10-CM | POA: Diagnosis not present

## 2024-06-16 NOTE — Therapy (Signed)
 OUTPATIENT SPEECH LANGUAGE PATHOLOGY APHASIA TREATMENT   Patient Name: Robert Mann MRN: [id number] DOB:January 19, 1951, 73 y.o., male Today's Date: 06/16/2024  PCP: Tonye Huxley, NP REFERRING PROVIDER: Dina Credit, PA  END OF SESSION:  End of Session - 06/16/24 0850     Visit Number 7    Number of Visits 17    Date for SLP Re-Evaluation 07/04/24    SLP Start Time 0850   arrived 0849   SLP Stop Time  0930    SLP Time Calculation (min) 40 min    Activity Tolerance Patient tolerated treatment well            Past Medical History:  Diagnosis Date   Essential hypertension    History of kidney stones    History of melanoma 01/2018   left chest   History of placement of chest tube 10/1979   History of pneumothorax 1980   Hyperlipidemia    Mild neurocognitive disorder, 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   Past Surgical History:  Procedure Laterality Date   MELANOMA EXCISION WITH SENTINEL LYMPH NODE BIOPSY Left 02/08/2018   Procedure: WIDE EXCISION OF LEFT CHEST MELANOMA AND LEFT AXILLARY SENTINEL LYMPH NODE;  Surgeon: Sebastian Moles, MD;  Location: [facility];  Service: General;  Laterality: Left;   WISDOM TOOTH EXTRACTION     Patient Active Problem List   Diagnosis Date Noted   Mild neurocognitive disorder, 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 Seen for approx 10  visits of ST in 2022-2023. hypertension, hyperlipidemia, h/o melanoma L chest and L shoulder. 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   PAIN:  Are you having pain? No  FALLS: Has patient fallen in last 6 months?  No   PATIENT GOALS: Improve expressive language  OBJECTIVE:  Note: Objective measures were completed at Evaluation unless otherwise noted.  DIAGNOSTIC FINDINGS:  DINA 04/02/24 Robert Mann is a very pleasant 73 y.o. 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 Patient is currently on donepezil  10 mg daily and memantine  10 mg twice daily.  Memory is stable but he was unable to perform MMSE due to verbal difficulties which became very frustrating to him.  In the past, he has done speech therapy but in view of worsening speech, we discussed referring him again and he agrees .He is able to participate on his ADLs, continues to drive without any issues, mood is good recommend good control of her cardiovascular risk factors    STANDARDIZED ASSESSMENTS: QAB: Pt does not wish standardized assessment.  PATIENT REPORTED OUTCOME MEASURES (PROM): Communication Effectiveness Survey: lower scores indicate greater negative impact on pt's QOL.Pt scored himself 18/32, with 1 (not at all effective) scored on participating in  conversation with strangers in a quiet place. 3 (mostly effective) scored for having a conversation with a family member or friends at home,  conversing with a familiar person over the phone, and having a conversation while traveling in a car. [name] (wife) scored the patient 13/32 with 1 scored on being part of a conversation in a noisy environment, speaking to a friend when  emotionally upset/angry, and having a conversation with someone at a distance/across a room. [name] did not score the patient any 3 (mostly effective). Pt and wife agreed on 2 for conversing over the telephone with a stranger.                                                                                                                            TREATMENT DATE:   06/16/24: Pt arrived alone. [name] shared with SLP he continues to use his AAC book at home, mostly, but indicated today he would take it to a restaurant instead of having wife order for him or point at his menu selection. SLP worked on Theatre manager AAC book more functional for him; Through conversation with pt it was learned he had shrimp and scallops last week and enjoys this at restaurants. Pt copied this into his fish page in his AAC book. Additionally, pt also enjoys sweet tea so SLP had pt copy this into his drinks page. SLP suspected pt comprehended approx 70% of SLP questions today. SLP rephrased, used slower speech rate, and used written keywords to improve comprehension.  06/09/24: Pt functionally explained about 4th of July fireworks by one of his neighbors with keywords and gestures, upon SLP questinoing about his July 4th.  Today, SLP asked pt questions and pt answered using his low tech AAC with occasional SLP cues repeating the question 4/4. Pt also successful using keywords, and extra time for 5 other statements pt made 2/5.  SLP had to cue pt to use book and this improved success to 3/5. Pt endorses using his low-tech AAC more often at home than prior to ST.  SLP asacertained today that pt uses menu board to order by reading off what he likes at the stadium, and commends common/rote words phrases (yeah it was, yes, etc) on others' comments at the stadium. He denies requiring a page for [employer]. SLP had pt add 2% and slice (by copying SLP written words) to his book today for more precise  verbalization PRN. SLP encouraged pt to invite wife to next session.  05/20/24: Pt explained to SLP using his book that he communicated with [name] since last session, about desire to go to [employer], with his book. SLP worked with pt increasing his familiarity with his AAC book, asking him questions he would need to use the book for. He used book correctly 3/3, with extra time. He attempted to tell SLP something about food/drink at [employer] but did not have the necessary pictures in his book. SLP strongly suggested  pt have a page for [employer] - pt agreed. SLP added hot and cold weather pictures for pt to communicate weather terms using his book.   05/16/24: Pt entered stating to SLP upon questioning that he has used his picture/AAC book with [place] in the last week. SLP strongly encouraged pt to use book even for beginning with reading his target word/s as he indicated he only uses book when he absolutely has to. SLP told pt to use book for practice verbalizing target words and explained rationale. SLP worked with pt with family relationships. Pt error awareness with paraphasic semantic errors was only 20%. SLP worked with pt to write down word cues for family relationships and place them in his book for use in future. Prior to using book pt answered questions re: family 20% of the time with correct answers 10%. With his book, pt answered questions 5/5 of the time with correct answers 5/5 with min extra time. SLP used this comparison to encourage pt to use book as much as possible.  05/12/24: Pt with abandoned utterances throughout session - generated 2-4 words and then stopped. Tried to rephrase but often could  not do so. SLP worked with pt on his picture book. Pt stated concrete ideas with the book more successfully than without. Reading the signs on the pictures was helpful but not failsafe - apraxia proved difficult for pt to overcome today. SLP cont to tell pt he will need to  use some sort of alternative/augmentative communication device and again explained why for pt.  05/09/24: Pt did not complete QAB today due to appearing frustrated about his anomia. Will complete next session. Today SLP practiced compensatory measures with pt (his book of personally relevant words and his phone). SLP req'd to use mod-max A with pt's maps app to show me the restaurants he and wife enjoy. Pt req'd assistance to search appropriately - he would type one letter and would look for AI suggestions - by 4 reps pt was typing in more letters into search bar but cont'd to look at AI and press incorrect location of multiple location restaurants. SLP to cont to work with pt. With his book, he told SLP which people were alive or deceased, using the printed word 80% of the time.   05/05/24: SLP reviewed role of SLP in ST, general goals, discussion about how augmentative items can enhance communication and pt's need to embrace these items if he wants to communicate with people.  PATIENT EDUCATION: Education details: see treatment date this date, above Person educated: Patient and Spouse Education method: Explanation, Facilities manager, Verbal cues, and Handouts Education comprehension: verbalized understanding, returned demonstration, verbal cues required, and needs further education   GOALS: Goals reviewed with patient? Yes  SHORT TERM GOALS: Target date: 06/06/24  Pt will complete QAB prior to 05/26/24 Baseline: Goal status: not met - pt does not desire standardized testing for aphasia  2.  Pt will use alternative communication means (low tech) via phone, photo book, or copied onto paper at least once daily between 3 sessions Baseline: 05/16/24, 05/20/24 Goal status: partially met  3.  Pt will abandon <50% of utterances using compensations in one minute of conversational speech Baseline:  Goal status: met  4.  Pt will use two words to describe an item on personally relevant word list twice in  one session Baseline:  Goal status: deferred due to picture book use more important at this time   LONG TERM GOALS: Target date: 07/04/24  Pt will improve PROM compared to initial administration Baseline:  Goal status: INITIAL  2.  Pt will use alternative communication means (low tech) via phone, photo book, or paper at least x5 daily between 2 sessions Baseline:  Goal status: INITIAL  3.  Pt will abandon only 20% of utterances in one minute of conversational speech using compensations  Baseline:  Goal status: INITIAL  4.  Pt will indicate higher frequency of successful communication at baseball games (using compensation) than prior to ST Baseline:  Goal status: INITIAL   ASSESSMENT:  CLINICAL IMPRESSION: Patient is a 73 y.o. M who was seen today for treatment of language in light of PPA diagnosed 2022. See treatment date above for today's date for further details on today's session. Pt cont to report use of alternative or augmentative means for communication at home. It is necessary for pt to learn more about how to use alternative means of communication in order to improve success and effectiveness of communication at home and in the community.   OBJECTIVE IMPAIRMENTS: include awareness, expressive language, receptive language, and aphasia. These impairments are limiting patient from managing medications, managing appointments, managing finances, household responsibilities, ADLs/IADLs, and effectively communicating at home and in community. Factors affecting potential to achieve goals and functional outcome are cooperation/participation level, medical prognosis, previous level of function, and severity of impairments. Patient will benefit from skilled SLP services to address above impairments and improve overall function.  REHAB POTENTIAL: Fair given above factors  PLAN:  SLP FREQUENCY: 1-2x/week  SLP DURATION: 8 weeks  PLANNED INTERVENTIONS: Language facilitation,  Environmental controls, Cueing hierachy, Internal/external aids, Functional tasks, Multimodal communication approach, SLP instruction and feedback, Compensatory strategies, Patient/family education, and [id number] Treatment of speech (30 or 45 min)     Tejal Monroy, CCC-SLP 06/16/2024, 12:32 PM

## 2024-06-18 DIAGNOSIS — D225 Melanocytic nevi of trunk: Secondary | ICD-10-CM | POA: Diagnosis not present

## 2024-06-18 DIAGNOSIS — D224 Melanocytic nevi of scalp and neck: Secondary | ICD-10-CM | POA: Diagnosis not present

## 2024-06-18 DIAGNOSIS — D485 Neoplasm of uncertain behavior of skin: Secondary | ICD-10-CM | POA: Diagnosis not present

## 2024-06-18 DIAGNOSIS — D1801 Hemangioma of skin and subcutaneous tissue: Secondary | ICD-10-CM | POA: Diagnosis not present

## 2024-06-18 DIAGNOSIS — D1809 Hemangioma of other sites: Secondary | ICD-10-CM | POA: Diagnosis not present

## 2024-06-18 DIAGNOSIS — L570 Actinic keratosis: Secondary | ICD-10-CM | POA: Diagnosis not present

## 2024-06-18 DIAGNOSIS — Z85820 Personal history of malignant melanoma of skin: Secondary | ICD-10-CM | POA: Diagnosis not present

## 2024-06-18 DIAGNOSIS — D2271 Melanocytic nevi of right lower limb, including hip: Secondary | ICD-10-CM | POA: Diagnosis not present

## 2024-06-18 DIAGNOSIS — L821 Other seborrheic keratosis: Secondary | ICD-10-CM | POA: Diagnosis not present

## 2024-06-18 DIAGNOSIS — D692 Other nonthrombocytopenic purpura: Secondary | ICD-10-CM | POA: Diagnosis not present

## 2024-06-23 ENCOUNTER — Ambulatory Visit

## 2024-06-23 DIAGNOSIS — R4701 Aphasia: Secondary | ICD-10-CM

## 2024-06-23 DIAGNOSIS — G3101 Pick's disease: Secondary | ICD-10-CM | POA: Diagnosis not present

## 2024-06-23 DIAGNOSIS — F0280 Dementia in other diseases classified elsewhere without behavioral disturbance: Secondary | ICD-10-CM | POA: Diagnosis not present

## 2024-06-23 NOTE — Therapy (Signed)
 OUTPATIENT SPEECH LANGUAGE PATHOLOGY APHASIA TREATMENT   Patient Name: Robert Mann MRN: [id number] DOB:1951/01/09, 73 y.o., male Today's Date: 06/23/2024  PCP: Robert Huxley, NP REFERRING PROVIDER: Dina Credit, PA  END OF SESSION:  End of Session - 06/23/24 0934     Visit Number 8    Number of Visits 17    Date for SLP Re-Evaluation 07/04/24    SLP Start Time 0850    SLP Stop Time  0928    SLP Time Calculation (min) 38 min    Activity Tolerance Patient tolerated treatment well             Past Medical History:  Diagnosis Date   Essential hypertension    History of kidney stones    History of melanoma 01/2018   left chest   History of placement of chest tube 10/1979   History of pneumothorax 1980   Hyperlipidemia    Mild neurocognitive disorder, 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   Past Surgical History:  Procedure Laterality Date   MELANOMA EXCISION WITH SENTINEL LYMPH NODE BIOPSY Left 02/08/2018   Procedure: WIDE EXCISION OF LEFT CHEST MELANOMA AND LEFT AXILLARY SENTINEL LYMPH NODE;  Surgeon: Robert Moles, MD;  Location: [facility] OR;  Service: General;  Laterality: Left;   WISDOM TOOTH EXTRACTION     Patient Active Problem List   Diagnosis Date Noted   Mild neurocognitive disorder, 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 Arrived alone. SLP told pt to have wife come next session.  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 Seen for approx 10 visits of ST in 2022-2023. hypertension, hyperlipidemia, h/o melanoma L chest and L shoulder. 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   PAIN:  Are you having pain? No  FALLS: Has patient fallen in last 6 months?  No   PATIENT GOALS: Improve expressive language  OBJECTIVE:  Note: Objective measures were completed at Evaluation unless otherwise noted.  DIAGNOSTIC FINDINGS:  Robert 04/02/24 Robert Mann is a very pleasant 73 y.o. 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 Patient is currently on donepezil  10 mg daily and memantine  10 mg twice daily.  Memory is stable but he was unable to perform MMSE due to verbal difficulties which became very frustrating to him.  In the past, he has done speech therapy but in view of worsening speech, we discussed referring him again and he agrees .He is able to participate on his ADLs, continues to drive without any issues, mood is good recommend good control of her cardiovascular risk factors    STANDARDIZED ASSESSMENTS: QAB: Pt does not wish standardized assessment.  PATIENT REPORTED OUTCOME MEASURES (PROM): Communication Effectiveness Survey: lower scores indicate greater negative impact on pt's QOL.Pt scored himself 18/32, with  1 (not at all effective) scored on participating in conversation with strangers in a quiet place. 3 (mostly effective) scored for having a conversation with a family member or friends at home,  conversing with a familiar person over the phone, and having a conversation while traveling in a car. Robert Mann (wife) scored the patient 13/32 with 1 scored on being part of a conversation in a noisy  environment, speaking to a friend when emotionally upset/angry, and having a conversation with someone at a distance/across a room. Robert Mann did not score the patient any 3 (mostly effective). Pt and wife agreed on 2 for conversing over the telephone with a stranger.                                                                                                                            TREATMENT DATE:   06/23/24: Pt communicated to SLP that his grandsons are at his home and he has not gone out to a restaurant. He and SLP spoke mainly about high interest topic - [employer] baseball. Pt functionally communicated message 40% of the time with consistent extra time necessary. Pt abandoned 35% utterances today. To make communication book more functional for pt, SLP assisted pt adding phrases and words into pt's communication book. Pt spontaneously went to his book to communicate in 25% of possible opportunities. SLP cued pt to do so today, and pt did so successfully 50% of the time. SLP specifically requested that wife attend next session to see how things are going at home and how SLP can facilitate better communication at home between wife and pt.   06/16/24: Pt arrived alone. [name] shared with SLP he continues to use his AAC book at home, mostly, but indicated today he would take it to a restaurant instead of having wife order for him or point at his menu selection. SLP worked on Theatre manager AAC book more functional for him; Through conversation with pt it was learned he had shrimp and scallops last week and enjoys this at restaurants. Pt copied this into his fish page in his AAC book. Additionally, pt also enjoys sweet tea so SLP had pt copy this into his drinks page. SLP suspected pt comprehended approx 70% of SLP questions today. SLP rephrased, used slower speech rate, and used written keywords to improve comprehension.  06/09/24: Pt functionally explained about 4th of July  fireworks by one of his neighbors with keywords and gestures, upon SLP questinoing about his July 4th.  Today, SLP asked pt questions and pt answered using his low tech AAC with occasional SLP cues repeating the question 4/4. Pt also successful using keywords, and extra time for 5 other statements pt made 2/5.  SLP had to cue pt to use book and this improved success to 3/5. Pt endorses using his low-tech AAC more often at home than prior to ST.  SLP asacertained today that pt uses menu board to order by reading off what he  likes at the stadium, and commends common/rote words phrases (yeah it was, yes, etc) on others' comments at the stadium. He denies requiring a page for [employer]. SLP had pt add 2% and slice (by copying SLP written words) to his book today for more precise verbalization PRN. SLP encouraged pt to invite wife to next session.  05/20/24: Pt explained to SLP using his book that he communicated with Robert Mann since last session, about desire to go to [place], with his book. SLP worked with pt increasing his familiarity with his AAC book, asking him questions he would need to use the book for. He used book correctly 3/3, with extra time. He attempted to tell SLP something about food/drink at [employer] but did not have the necessary pictures in his book. SLP strongly suggested pt have a page for [employer] - pt agreed. SLP added hot and cold weather pictures for pt to communicate weather terms using his book.   05/16/24: Pt entered stating to SLP upon questioning that he has used his picture/AAC book with [place] in the last week. SLP strongly encouraged pt to use book even for beginning with reading his target word/s as he indicated he only uses book when he absolutely has to. SLP told pt to use book for practice verbalizing target words and explained rationale. SLP worked with pt with family relationships. Pt error awareness with paraphasic semantic errors was  only 20%. SLP worked with pt to write down word cues for family relationships and place them in his book for use in future. Prior to using book pt answered questions re: family 20% of the time with correct answers 10%. With his book, pt answered questions 5/5 of the time with correct answers 5/5 with min extra time. SLP used this comparison to encourage pt to use book as much as possible.  05/12/24: Pt with abandoned utterances throughout session - generated 2-4 words and then stopped. Tried to rephrase but often could  not do so. SLP worked with pt on his picture book. Pt stated concrete ideas with the book more successfully than without. Reading the signs on the pictures was helpful but not failsafe - apraxia proved difficult for pt to overcome today. SLP cont to tell pt he will need to use some sort of alternative/augmentative communication device and again explained why for pt.  05/09/24: Pt did not complete QAB today due to appearing frustrated about his anomia. Will complete next session. Today SLP practiced compensatory measures with pt (his book of personally relevant words and his phone). SLP req'd to use mod-max A with pt's maps app to show me the restaurants he and wife enjoy. Pt req'd assistance to search appropriately - he would type one letter and would look for AI suggestions - by 4 reps pt was typing in more letters into search bar but cont'd to look at AI and press incorrect location of multiple location restaurants. SLP to cont to work with pt. With his book, he told SLP which people were alive or deceased, using the printed word 80% of the time.   05/05/24: SLP reviewed role of SLP in ST, general goals, discussion about how augmentative items can enhance communication and pt's need to embrace these items if he wants to communicate with people.  PATIENT EDUCATION: Education details: see treatment date this date, above Person educated: Patient and Spouse Education method: Explanation,  Demonstration, Verbal cues, and Handouts Education comprehension: verbalized understanding, returned demonstration, verbal cues required, and needs  further education   GOALS: Goals reviewed with patient? Yes  SHORT TERM GOALS: Target date: 06/06/24  Pt will complete QAB prior to 05/26/24 Baseline: Goal status: not met - pt does not desire standardized testing for aphasia  2.  Pt will use alternative communication means (low tech) via phone, photo book, or copied onto paper at least once daily between 3 sessions Baseline: 05/16/24, 05/20/24 Goal status: partially met  3.  Pt will abandon <50% of utterances using compensations in one minute of conversational speech Baseline:  Goal status: met  4.  Pt will use two words to describe an item on personally relevant word list twice in one session Baseline:  Goal status: deferred due to picture book use more important at this time   LONG TERM GOALS: Target date: 07/04/24  Pt will improve PROM compared to initial administration Baseline:  Goal status: INITIAL  2.  Pt will use alternative communication means (low tech) via phone, photo book, or paper at least x5 daily between 2 sessions Baseline:  Goal status: INITIAL  3.  Pt will abandon only 20% of utterances in one minute of conversational speech using compensations  Baseline:  Goal status: INITIAL  4.  Pt will indicate higher frequency of successful communication at baseball games (using compensation) than prior to ST Baseline:  Goal status: INITIAL   ASSESSMENT:  CLINICAL IMPRESSION: Patient is a 73 y.o. M who was seen today for treatment of language in light of PPA diagnosed 2022. See treatment date above for today's date for further details on today's session. Pt cont to report use of alternative or augmentative means for communication at home. SLP requested wife attend next session. It is necessary for pt to learn more about how to use alternative means of communication in  order to improve success and effectiveness of communication at home and in the community.   OBJECTIVE IMPAIRMENTS: include awareness, expressive language, receptive language, and aphasia. These impairments are limiting patient from managing medications, managing appointments, managing finances, household responsibilities, ADLs/IADLs, and effectively communicating at home and in community. Factors affecting potential to achieve goals and functional outcome are cooperation/participation level, medical prognosis, previous level of function, and severity of impairments. Patient will benefit from skilled SLP services to address above impairments and improve overall function.  REHAB POTENTIAL: Fair given above factors  PLAN:  SLP FREQUENCY: 1-2x/week  SLP DURATION: 8 weeks  PLANNED INTERVENTIONS: Language facilitation, Environmental controls, Cueing hierachy, Internal/external aids, Functional tasks, Multimodal communication approach, SLP instruction and feedback, Compensatory strategies, Patient/family education, and [id number] Treatment of speech (30 or 45 min)     Linas Stepter, CCC-SLP 06/23/2024, 9:35 AM

## 2024-06-25 ENCOUNTER — Ambulatory Visit

## 2024-06-25 DIAGNOSIS — F0280 Dementia in other diseases classified elsewhere without behavioral disturbance: Secondary | ICD-10-CM

## 2024-06-25 DIAGNOSIS — R4701 Aphasia: Secondary | ICD-10-CM | POA: Diagnosis not present

## 2024-06-25 DIAGNOSIS — G3101 Pick's disease: Secondary | ICD-10-CM | POA: Diagnosis not present

## 2024-06-25 NOTE — Therapy (Signed)
 OUTPATIENT SPEECH LANGUAGE PATHOLOGY APHASIA TREATMENT   Patient Name: Robert Mann MRN: [id number] DOB:Jun 27, 1951, 73 y.o., male Today's Date: 06/25/2024  PCP: Robert Huxley, NP REFERRING PROVIDER: Dina Credit, PA  END OF SESSION:  End of Session - 06/25/24 1912     Visit Number 9    Number of Visits 17    Date for SLP Re-Evaluation 07/04/24    SLP Start Time 0850    SLP Stop Time  0930    SLP Time Calculation (min) 40 min    Activity Tolerance Patient tolerated treatment well              Past Medical History:  Diagnosis Date   Essential hypertension    History of kidney stones    History of melanoma 01/2018   left chest   History of placement of chest tube 10/1979   History of pneumothorax 1980   Hyperlipidemia    Mild neurocognitive disorder, 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   Past Surgical History:  Procedure Laterality Date   MELANOMA EXCISION WITH SENTINEL LYMPH NODE BIOPSY Left 02/08/2018   Procedure: WIDE EXCISION OF LEFT CHEST MELANOMA AND LEFT AXILLARY SENTINEL LYMPH NODE;  Surgeon: Robert Moles, MD;  Location: [facility] OR;  Service: General;  Laterality: Left;   WISDOM TOOTH EXTRACTION     Patient Active Problem List   Diagnosis Date Noted   Mild neurocognitive disorder, 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  Rationale for Evaluation and Treatment: Rehabilitation  SUBJECTIVE:   SUBJECTIVE STATEMENT: Pt brought his low-tech AAC (picture book) with him today. Said wife was arriving next session.   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 Seen for  approx 10 visits of ST in 2022-2023. hypertension, hyperlipidemia, h/o melanoma L chest and L shoulder. 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   PAIN:  Are you having pain? No  FALLS: Has patient fallen in last 6 months?  No   PATIENT GOALS: Improve expressive language  OBJECTIVE:  Note: Objective measures were completed at Evaluation unless otherwise noted.  DIAGNOSTIC FINDINGS:  Robert 04/02/24 Robert Mann is a very pleasant 73 y.o. 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 Patient is currently on donepezil  10 mg daily and memantine  10 mg twice daily.  Memory is stable but he was unable to perform MMSE due to verbal difficulties which became very frustrating to him.  In the past, he has done speech therapy but in view of worsening speech, we discussed referring him again and he agrees .He is able to participate on his ADLs, continues to drive without any issues, mood is good recommend good control of her cardiovascular risk factors    STANDARDIZED ASSESSMENTS: QAB: Pt does not wish standardized assessment.  PATIENT REPORTED OUTCOME MEASURES (PROM): Communication Effectiveness Survey: lower scores indicate greater negative impact on pt's QOL.Pt scored himself 18/32, with 1 (not at all effective)  scored on participating in conversation with strangers in a quiet place. 3 (mostly effective) scored for having a conversation with a family member or friends at home,  conversing with a familiar person over the phone, and having a conversation while traveling in a car. Robert Mann (wife) scored the patient 13/32 with 1 scored on being part of a conversation in a noisy environment, speaking to a friend when  emotionally upset/angry, and having a conversation with someone at a distance/across a room. Robert Mann did not score the patient any 3 (mostly effective). Pt and wife agreed on 2 for conversing over the telephone with a stranger.                                                                                                                            TREATMENT DATE:   06/25/24: The boys gone. Pt indicated that his grandsons have left. SLP learned pt was out doing yardwork and enjoys this. SLP collaborated with pt to add [employer] in his low tech AAC book. Pt chose the page and the configuration of the icons. Pt's verbalization was 90% accurate with pictures and words for mowing grass, trimming, blowing, and cut wood. Pt thanked SLP for assistance today.  06/23/24: Pt communicated to SLP that his grandsons are at his home and he has not gone out to a restaurant. He and SLP spoke mainly about high interest topic - [employer] baseball. Pt functionally communicated message 40% of the time with consistent extra time necessary. Pt abandoned 35% utterances today. To make communication book more functional for pt, SLP assisted pt adding phrases and words into pt's communication book. Pt spontaneously went to his book to communicate in 25% of possible opportunities. SLP cued pt to do so today, and pt did so successfully 50% of the time. SLP specifically requested that wife attend next session to see how things are going at home and how SLP can facilitate better communication at home between wife and pt.   06/16/24: Pt arrived alone. [name] shared with SLP he continues to use his AAC book at home, mostly, but indicated today he would take it to a restaurant instead of having wife order for him or point at his menu selection. SLP worked on Theatre manager AAC book more functional for him; Through conversation with pt it was learned he had shrimp and scallops last week and enjoys this at  restaurants. Pt copied this into his fish page in his AAC book. Additionally, pt also enjoys sweet tea so SLP had pt copy this into his drinks page. SLP suspected pt comprehended approx 70% of SLP questions today. SLP rephrased, used slower speech rate, and used written keywords to improve comprehension.  06/09/24: Pt functionally explained about 4th of July fireworks by one of his neighbors with keywords and gestures, upon SLP questinoing about his July 4th.  Today, SLP asked pt questions and pt answered using his low tech AAC with occasional SLP cues  repeating the question 4/4. Pt also successful using keywords, and extra time for 5 other statements pt made 2/5.  SLP had to cue pt to use book and this improved success to 3/5. Pt endorses using his low-tech AAC more often at home than prior to ST.  SLP asacertained today that pt uses menu board to order by reading off what he likes at the stadium, and commends common/rote words phrases (yeah it was, yes, etc) on others' comments at the stadium. He denies requiring a page for [employer]. SLP had pt add 2% and slice (by copying SLP written words) to his book today for more precise verbalization PRN. SLP encouraged pt to invite wife to next session.  05/20/24: Pt explained to SLP using his book that he communicated with Robert Mann since last session, about desire to go to [place], with his book. SLP worked with pt increasing his familiarity with his AAC book, asking him questions he would need to use the book for. He used book correctly 3/3, with extra time. He attempted to tell SLP something about food/drink at [employer] but did not have the necessary pictures in his book. SLP strongly suggested pt have a page for [employer] - pt agreed. SLP added hot and cold weather pictures for pt to communicate weather terms using his book.   05/16/24: Pt entered stating to SLP upon questioning that he has used his picture/AAC book with  [place] in the last week. SLP strongly encouraged pt to use book even for beginning with reading his target word/s as he indicated he only uses book when he absolutely has to. SLP told pt to use book for practice verbalizing target words and explained rationale. SLP worked with pt with family relationships. Pt error awareness with paraphasic semantic errors was only 20%. SLP worked with pt to write down word cues for family relationships and place them in his book for use in future. Prior to using book pt answered questions re: family 20% of the time with correct answers 10%. With his book, pt answered questions 5/5 of the time with correct answers 5/5 with min extra time. SLP used this comparison to encourage pt to use book as much as possible.  05/12/24: Pt with abandoned utterances throughout session - generated 2-4 words and then stopped. Tried to rephrase but often could  not do so. SLP worked with pt on his picture book. Pt stated concrete ideas with the book more successfully than without. Reading the signs on the pictures was helpful but not failsafe - apraxia proved difficult for pt to overcome today. SLP cont to tell pt he will need to use some sort of alternative/augmentative communication device and again explained why for pt.  05/09/24: Pt did not complete QAB today due to appearing frustrated about his anomia. Will complete next session. Today SLP practiced compensatory measures with pt (his book of personally relevant words and his phone). SLP req'd to use mod-max A with pt's maps app to show me the restaurants he and wife enjoy. Pt req'd assistance to search appropriately - he would type one letter and would look for AI suggestions - by 4 reps pt was typing in more letters into search bar but cont'd to look at AI and press incorrect location of multiple location restaurants. SLP to cont to work with pt. With his book, he told SLP which people were alive or deceased, using the printed word 80% of  the time.   05/05/24: SLP reviewed  role of SLP in ST, general goals, discussion about how augmentative items can enhance communication and pt's need to embrace these items if he wants to communicate with people.  PATIENT EDUCATION: Education details: see treatment date this date, above Person educated: Patient and Spouse Education method: Explanation, Facilities manager, Verbal cues, and Handouts Education comprehension: verbalized understanding, returned demonstration, verbal cues required, and needs further education   GOALS: Goals reviewed with patient? Yes  SHORT TERM GOALS: Target date: 06/06/24  Pt will complete QAB prior to 05/26/24 Baseline: Goal status: not met - pt does not desire standardized testing for aphasia  2.  Pt will use alternative communication means (low tech) via phone, photo book, or copied onto paper at least once daily between 3 sessions Baseline: 05/16/24, 05/20/24 Goal status: partially met  3.  Pt will abandon <50% of utterances using compensations in one minute of conversational speech Baseline:  Goal status: met  4.  Pt will use two words to describe an item on personally relevant word list twice in one session Baseline:  Goal status: deferred due to picture book use more important at this time   LONG TERM GOALS: Target date: 07/04/24  Pt will improve PROM compared to initial administration Baseline:  Goal status: INITIAL  2.  Pt will use alternative communication means (low tech) via phone, photo book, or paper at least x5 daily between 2 sessions Baseline:  Goal status: INITIAL  3.  Pt will abandon only 20% of utterances in one minute of conversational speech using compensations  Baseline:  Goal status: INITIAL  4.  Pt will indicate higher frequency of successful communication at baseball games (using compensation) than prior to ST Baseline:  Goal status: INITIAL   ASSESSMENT:  CLINICAL IMPRESSION: Patient is a 73 y.o. M who was seen today  for treatment of language in light of PPA diagnosed 2022. See treatment date above for today's date for further details on today's session. Pt cont to report use of alternative or augmentative means for communication at home. Wife did not attend today- pt stated she would come next session. It is necessary for pt and wife to learn more about how to use alternative means of communication in order to improve success and effectiveness of communication at home and in the community.   OBJECTIVE IMPAIRMENTS: include awareness, expressive language, receptive language, and aphasia. These impairments are limiting patient from managing medications, managing appointments, managing finances, household responsibilities, ADLs/IADLs, and effectively communicating at home and in community. Factors affecting potential to achieve goals and functional outcome are cooperation/participation level, medical prognosis, previous level of function, and severity of impairments. Patient will benefit from skilled SLP services to address above impairments and improve overall function.  REHAB POTENTIAL: Fair given above factors  PLAN:  SLP FREQUENCY: 1-2x/week  SLP DURATION: 8 weeks  PLANNED INTERVENTIONS: Language facilitation, Environmental controls, Cueing hierachy, Internal/external aids, Functional tasks, Multimodal communication approach, SLP instruction and feedback, Compensatory strategies, Patient/family education, and [id number] Treatment of speech (30 or 45 min)     Aunisty Reali, CCC-SLP 06/25/2024, 7:12 PM

## 2024-06-30 ENCOUNTER — Ambulatory Visit: Attending: Physician Assistant

## 2024-06-30 DIAGNOSIS — G3101 Pick's disease: Secondary | ICD-10-CM | POA: Insufficient documentation

## 2024-06-30 DIAGNOSIS — F0280 Dementia in other diseases classified elsewhere without behavioral disturbance: Secondary | ICD-10-CM | POA: Diagnosis not present

## 2024-06-30 DIAGNOSIS — R4701 Aphasia: Secondary | ICD-10-CM | POA: Diagnosis not present

## 2024-06-30 NOTE — Patient Instructions (Addendum)
 Robert Mann

## 2024-06-30 NOTE — Therapy (Signed)
 OUTPATIENT SPEECH LANGUAGE PATHOLOGY APHASIA TREATMENT/PROGRESS NOTE   Patient Name: Robert Mann MRN: [id number] DOB:09/27/1951, 73 y.o., male Today's Date: 06/30/2024  PCP: Tonye Huxley, NP REFERRING PROVIDER: Dina Credit, PA  END OF SESSION:  End of Session - 06/30/24 1727     Visit Number 10    Number of Visits 17    Date for SLP Re-Evaluation 08/08/24    SLP Start Time 0850    SLP Stop Time  0930    SLP Time Calculation (min) 40 min    Activity Tolerance Patient tolerated treatment well               Past Medical History:  Diagnosis Date   Essential hypertension    History of kidney stones    History of melanoma 01/2018   left chest   History of placement of chest tube 10/1979   History of pneumothorax 1980   Hyperlipidemia    Mild neurocognitive disorder, 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   Past Surgical History:  Procedure Laterality Date   MELANOMA EXCISION WITH SENTINEL LYMPH NODE BIOPSY Left 02/08/2018   Procedure: WIDE EXCISION OF LEFT CHEST MELANOMA AND LEFT AXILLARY SENTINEL LYMPH NODE;  Surgeon: Sebastian Moles, MD;  Location: [facility] OR;  Service: General;  Laterality: Left;   WISDOM TOOTH EXTRACTION     Patient Active Problem List   Diagnosis Date Noted   Mild neurocognitive disorder, 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  Objective: Pt has incr'd use of AAC book slightly since ST began. SLP continues to encourage pt to use and for wife to attend ST.  Goal Update: See below.  Plan: See pt for 8 more sessions.   Reason Skilled Services are Required: Pt's wife has not yet attended therapy  sessions, it is imperative she do so at this time to provide framework for SLP to cont to assist pt with AAC book, and to learn how to best work with pt at home for less-frustrating communication.    ONSET DATE: script 04/02/24  REFERRING DIAG:  H68.98,Q97.19 (ICD-10-CM) - Primary progressive aphasia (HCC)    THERAPY DIAG:  Aphasia  Primary progressive aphasia  Rationale for Evaluation and Treatment: Rehabilitation  SUBJECTIVE:   SUBJECTIVE STATEMENT: Pt brought his low-tech AAC (picture book) with him today. Pt alone again today.  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 Seen for approx 10 visits of ST in 2022-2023. hypertension, hyperlipidemia, h/o melanoma L chest and L shoulder. 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   PAIN:  Are you having pain? No  FALLS: Has patient fallen in last 6 months?  No   PATIENT GOALS: Improve expressive language  OBJECTIVE:  Note: Objective measures were completed at Evaluation unless otherwise noted.  DIAGNOSTIC FINDINGS:  DINA 04/02/24 Robert Mann is a very pleasant 73 y.o. 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 Patient is currently  on donepezil  10 mg daily and memantine  10 mg twice daily.  Memory is stable but he was unable to perform MMSE due to verbal difficulties which became very frustrating to him.  In the past, he has done speech therapy but in view of worsening speech, we discussed referring him again and he agrees .He is able to participate on his ADLs, continues to drive without any issues, mood is good recommend good control of her cardiovascular risk factors    STANDARDIZED ASSESSMENTS: QAB:  Pt does not wish standardized assessment.  PATIENT REPORTED OUTCOME MEASURES (PROM): Communication Effectiveness Survey: lower scores indicate greater negative impact on pt's QOL.Pt scored himself 18/32, with 1 (not at all effective) scored on participating in conversation with strangers in a quiet place. 3 (mostly effective) scored for having a conversation with a family member or friends at home,  conversing with a familiar person over the phone, and having a conversation while traveling in a car. [name] (wife) scored the patient 13/32 with 1 scored on being part of a conversation in a noisy environment, speaking to a friend when emotionally upset/angry, and having a conversation with someone at a distance/across a room. [name] did not score the patient any 3 (mostly effective). Pt and wife agreed on 2 for conversing over the telephone with a stranger.                                                                                                                            TREATMENT DATE:   06/30/24: SLP stressed reason and need for wife to attend therapy. Stated this in multiple ways - pt voiced understanding. Pt used book 3-4 times since last session.  Today pt appeared more frustrated about his speech than previous 3-4 sessions. Had more difficulty with language today but spontaneously opened book to sports page and indicated which team he follows after baseball season is over. Pt attempted to use book with SLP cue to do so, eventually communicating he picked up chinese food by walking in and waiting for the order to be called. Pt stated he would not take his AAC book to a [employer] game but would state yeah and other rote words/phrases as he can. SLP encouraged pt to cont to use book as he is able. SLP to cont to practice with pt in session but needs wife to provide description of situations which are challenging at home to give SLP some framework of how to  further hone pt's book for maximal use.  06/25/24: The boys gone. Pt indicated that his grandsons have left. SLP learned pt was out doing yardwork and enjoys this. SLP collaborated with pt to add [employer] in his low tech AAC book. Pt chose the page and the configuration of the icons. Pt's verbalization was 90% accurate with pictures and words for mowing grass, trimming, blowing, and cut wood. Pt thanked SLP for assistance today.  06/23/24: Pt communicated to SLP that his grandsons are  at his home and he has not gone out to a restaurant. He and SLP spoke mainly about high interest topic - [employer] baseball. Pt functionally communicated message 40% of the time with consistent extra time necessary. Pt abandoned 35% utterances today. To make communication book more functional for pt, SLP assisted pt adding phrases and words into pt's communication book. Pt spontaneously went to his book to communicate in 25% of possible opportunities. SLP cued pt to do so today, and pt did so successfully 50% of the time. SLP specifically requested that wife attend next session to see how things are going at home and how SLP can facilitate better communication at home between wife and pt.   06/16/24: Pt arrived alone. [name] shared with SLP he continues to use his AAC book at home, mostly, but indicated today he would take it to a restaurant instead of having wife order for him or point at his menu selection. SLP worked on Theatre manager AAC book more functional for him; Through conversation with pt it was learned he had shrimp and scallops last week and enjoys this at restaurants. Pt copied this into his fish page in his AAC book. Additionally, pt also enjoys sweet tea so SLP had pt copy this into his drinks page. SLP suspected pt comprehended approx 70% of SLP questions today. SLP rephrased, used slower speech rate, and used written keywords to improve comprehension.  06/09/24: Pt functionally explained about  4th of July fireworks by one of his neighbors with keywords and gestures, upon SLP questinoing about his July 4th.  Today, SLP asked pt questions and pt answered using his low tech AAC with occasional SLP cues repeating the question 4/4. Pt also successful using keywords, and extra time for 5 other statements pt made 2/5.  SLP had to cue pt to use book and this improved success to 3/5. Pt endorses using his low-tech AAC more often at home than prior to ST.  SLP asacertained today that pt uses menu board to order by reading off what he likes at the stadium, and commends common/rote words phrases (yeah it was, yes, etc) on others' comments at the stadium. He denies requiring a page for [employer]. SLP had pt add 2% and slice (by copying SLP written words) to his book today for more precise verbalization PRN. SLP encouraged pt to invite wife to next session.  05/20/24: Pt explained to SLP using his book that he communicated with [name] since last session, about desire to go to [place], with his book. SLP worked with pt increasing his familiarity with his AAC book, asking him questions he would need to use the book for. He used book correctly 3/3, with extra time. He attempted to tell SLP something about food/drink at [employer] but did not have the necessary pictures in his book. SLP strongly suggested pt have a page for [employer] - pt agreed. SLP added hot and cold weather pictures for pt to communicate weather terms using his book.   05/16/24: Pt entered stating to SLP upon questioning that he has used his picture/AAC book with [place] in the last week. SLP strongly encouraged pt to use book even for beginning with reading his target word/s as he indicated he only uses book when he absolutely has to. SLP told pt to use book for practice verbalizing target words and explained rationale. SLP worked with pt with family relationships. Pt error awareness with paraphasic semantic  errors was only 20%. SLP  worked with pt to write down word cues for family relationships and place them in his book for use in future. Prior to using book pt answered questions re: family 20% of the time with correct answers 10%. With his book, pt answered questions 5/5 of the time with correct answers 5/5 with min extra time. SLP used this comparison to encourage pt to use book as much as possible.  05/12/24: Pt with abandoned utterances throughout session - generated 2-4 words and then stopped. Tried to rephrase but often could  not do so. SLP worked with pt on his picture book. Pt stated concrete ideas with the book more successfully than without. Reading the signs on the pictures was helpful but not failsafe - apraxia proved difficult for pt to overcome today. SLP cont to tell pt he will need to use some sort of alternative/augmentative communication device and again explained why for pt.  05/09/24: Pt did not complete QAB today due to appearing frustrated about his anomia. Will complete next session. Today SLP practiced compensatory measures with pt (his book of personally relevant words and his phone). SLP req'd to use mod-max A with pt's maps app to show me the restaurants he and wife enjoy. Pt req'd assistance to search appropriately - he would type one letter and would look for AI suggestions - by 4 reps pt was typing in more letters into search bar but cont'd to look at AI and press incorrect location of multiple location restaurants. SLP to cont to work with pt. With his book, he told SLP which people were alive or deceased, using the printed word 80% of the time.   05/05/24: SLP reviewed role of SLP in ST, general goals, discussion about how augmentative items can enhance communication and pt's need to embrace these items if he wants to communicate with people.  PATIENT EDUCATION: Education details: see treatment date this date, above Person educated: Patient and Spouse Education method:  Explanation, Facilities manager, Verbal cues, and Handouts Education comprehension: verbalized understanding, returned demonstration, verbal cues required, and needs further education   GOALS: Goals reviewed with patient? Yes  SHORT TERM GOALS: Target date: 06/06/24  Pt will complete QAB prior to 05/26/24 Baseline: Goal status: not met - pt does not desire standardized testing for aphasia  2.  Pt will use alternative communication means (low tech) via phone, photo book, or copied onto paper at least once daily between 3 sessions Baseline: 05/16/24, 05/20/24 Goal status: partially met  3.  Pt will abandon <50% of utterances using compensations in one minute of conversational speech Baseline:  Goal status: met  4.  Pt will use two words to describe an item on personally relevant word list twice in one session Baseline:  Goal status: deferred due to picture book use more important at this time   LONG TERM GOALS: Target date:  08/08/24  Pt will improve PROM compared to initial administration Baseline:  Goal status: Taken on last day of ST  2.  Pt will use alternative communication means (low tech) via phone, photo book, or paper at least x5 daily between 2 sessions Baseline:  Goal status: not met and cont  3.  Pt will abandon only 20% of utterances in one minute of conversational speech using compensations  Baseline:  Goal status: not met and cont  4.  Pt will indicate higher frequency of successful communication at baseball games (using compensation) than prior to ST Baseline:  Goal status: not met and cont   ASSESSMENT:  CLINICAL IMPRESSION: RECERT TODAY. LTGs not met, mainly due to visit count, and minimally due to severity. Patient is a 73 y.o. M who was seen today for treatment of language in light of PPA diagnosed 2022. See treatment date above for today's date for further details on today's session. Pt cont to report use of alternative or augmentative means for  communication at home. Wife again did not attend today- SLP confirmed with pt is is imperative she arrive with pt next session. It is necessary for pt and wife to learn more about how to use alternative means of communication in order to improve success and effectiveness of communication at home and in the community.   OBJECTIVE IMPAIRMENTS: include awareness, expressive language, receptive language, and aphasia. These impairments are limiting patient from managing medications, managing appointments, managing finances, household responsibilities, ADLs/IADLs, and effectively communicating at home and in community. Factors affecting potential to achieve goals and functional outcome are cooperation/participation level, medical prognosis, previous level of function, and severity of impairments. Patient will benefit from skilled SLP services to address above impairments and improve overall function.  REHAB POTENTIAL: Fair given above factors  PLAN:  SLP FREQUENCY: 2x/week  SLP DURATION: 4 weeks  PLANNED INTERVENTIONS: Language facilitation, Environmental controls, Cueing hierachy, Internal/external aids, Functional tasks, Multimodal communication approach, SLP instruction and feedback, Compensatory strategies, Patient/family education, and [id number] Treatment of speech (30 or 45 min)     Lenora Gomes, CCC-SLP 06/30/2024, 5:56 PM

## 2024-07-02 ENCOUNTER — Ambulatory Visit

## 2024-07-02 DIAGNOSIS — F0280 Dementia in other diseases classified elsewhere without behavioral disturbance: Secondary | ICD-10-CM

## 2024-07-02 DIAGNOSIS — G3101 Pick's disease: Secondary | ICD-10-CM | POA: Diagnosis not present

## 2024-07-02 DIAGNOSIS — R4701 Aphasia: Secondary | ICD-10-CM

## 2024-07-02 NOTE — Therapy (Signed)
 OUTPATIENT SPEECH LANGUAGE PATHOLOGY APHASIA TREATMENT  Patient Name: Robert Mann MRN: [id number] DOB:1951-06-24, 73 y.o., male Today's Date: 07/02/2024  PCP: Tonye Huxley, NP REFERRING PROVIDER: Dina Credit, PA  END OF SESSION:  End of Session - 07/02/24 0929     Visit Number 11    Number of Visits 17    Date for SLP Re-Evaluation 08/08/24    SLP Start Time 0849    SLP Stop Time  0930    SLP Time Calculation (min) 41 min    Activity Tolerance Patient tolerated treatment well                Past Medical History:  Diagnosis Date   Essential hypertension    History of kidney stones    History of melanoma 01/2018   left chest   History of placement of chest tube 10/1979   History of pneumothorax 1980   Hyperlipidemia    Mild neurocognitive disorder, 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   Past Surgical History:  Procedure Laterality Date   MELANOMA EXCISION WITH SENTINEL LYMPH NODE BIOPSY Left 02/08/2018   Procedure: WIDE EXCISION OF LEFT CHEST MELANOMA AND LEFT AXILLARY SENTINEL LYMPH NODE;  Surgeon: Sebastian Moles, MD;  Location: [facility] OR;  Service: General;  Laterality: Left;   WISDOM TOOTH EXTRACTION     Patient Active Problem List   Diagnosis Date Noted   Mild neurocognitive disorder, 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 Seen for  approx 10 visits of ST in 2022-2023. hypertension, hyperlipidemia, h/o melanoma L chest and L shoulder. 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   PAIN:  Are you having pain? No  FALLS: Has patient fallen in last 6 months?  No   PATIENT GOALS: Improve expressive language  OBJECTIVE:  Note: Objective measures were completed at Evaluation unless otherwise noted.  DIAGNOSTIC FINDINGS:  DINA 04/02/24 Robert Mann is a very pleasant 73 y.o. 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 Patient is currently on donepezil  10 mg daily and memantine  10 mg twice daily.  Memory is stable but he was unable to perform MMSE due to verbal difficulties which became very frustrating to him.  In the past, he has done speech therapy but in view of worsening speech, we discussed referring him again and he agrees .He is able to participate on his ADLs, continues to drive without any issues, mood is good recommend good control of her cardiovascular risk factors    STANDARDIZED ASSESSMENTS: QAB: Pt does not wish standardized assessment.  PATIENT REPORTED OUTCOME MEASURES (PROM): Communication Effectiveness Survey: lower scores indicate greater negative impact on pt's QOL.Pt scored himself 18/32, with 1 (not at all effective) scored  on participating in conversation with strangers in a quiet place. 3 (mostly effective) scored for having a conversation with a family member or friends at home,  conversing with a familiar person over the phone, and having a conversation while traveling in a car. [name] (wife) scored the patient 13/32 with 1 scored on being part of a conversation in a noisy environment, speaking to a friend when  emotionally upset/angry, and having a conversation with someone at a distance/across a room. [name] did not score the patient any 3 (mostly effective). Pt and wife agreed on 2 for conversing over the telephone with a stranger.                                                                                                                            TREATMENT DATE:   07/02/24: [name] attended with pt today. She shared [name] has difficulty with getting his movie back on his iPad when he goes off and returns. She said there could be one of any different things that have occurred since he left and does not always know how to get back to his movie. [name] has to assist almost 100% of the time,; SLP does not believe this difficulty is due to aphasia but more due to pt's frustration leading to tapping everywhere on the screen. She assists pt and this de-escalates the situation. [name] stated pt's functional use of electronic devices has decreased since PPA, pt is easily frustrated with them. Frustration is also hindering pt using AAC book and communication, overall, on some days - SLP suggested talking to MD about this. Added icon and street names to book. Today SLP ascertained that pt's gas station of choice is [employer] on [address] so SLP added this to pt's book. SLP told pt and wife his homework is to add street names to cue him to say street names when needed.   06/30/24: SLP stressed reason and need for wife to attend therapy. Stated this in multiple ways - pt voiced understanding. Pt used book 3-4 times since last session.  Today pt appeared more frustrated about his speech than previous 3-4 sessions. Had more difficulty with language today but spontaneously opened book to sports page and indicated which team he follows after baseball season is over. Pt attempted to use book with SLP cue to do so, eventually communicating he picked up chinese food by walking in and waiting for the order to be  called. Pt stated he would not take his AAC book to a [employer] game but would state yeah and other rote words/phrases as he can. SLP encouraged pt to cont to use book as he is able. SLP to cont to practice with pt in session but needs wife to provide description of situations which are challenging at home to give SLP some framework of how to further hone pt's book for maximal use.  06/25/24: The boys gone. Pt indicated that his grandsons have left. SLP learned pt  was out doing yardwork and enjoys this. SLP collaborated with pt to add [employer] in his low tech AAC book. Pt chose the page and the configuration of the icons. Pt's verbalization was 90% accurate with pictures and words for mowing grass, trimming, blowing, and cut wood. Pt thanked SLP for assistance today.  06/23/24: Pt communicated to SLP that his grandsons are at his home and he has not gone out to a restaurant. He and SLP spoke mainly about high interest topic - [employer] baseball. Pt functionally communicated message 40% of the time with consistent extra time necessary. Pt abandoned 35% utterances today. To make communication book more functional for pt, SLP assisted pt adding phrases and words into pt's communication book. Pt spontaneously went to his book to communicate in 25% of possible opportunities. SLP cued pt to do so today, and pt did so successfully 50% of the time. SLP specifically requested that wife attend next session to see how things are going at home and how SLP can facilitate better communication at home between wife and pt.   06/16/24: Pt arrived alone. [name] shared with SLP he continues to use his AAC book at home, mostly, but indicated today he would take it to a restaurant instead of having wife order for him or point at his menu selection. SLP worked on Theatre manager AAC book more functional for him; Through conversation with pt it was learned he had shrimp and scallops last week and enjoys this at  restaurants. Pt copied this into his fish page in his AAC book. Additionally, pt also enjoys sweet tea so SLP had pt copy this into his drinks page. SLP suspected pt comprehended approx 70% of SLP questions today. SLP rephrased, used slower speech rate, and used written keywords to improve comprehension.  06/09/24: Pt functionally explained about 4th of July fireworks by one of his neighbors with keywords and gestures, upon SLP questinoing about his July 4th.  Today, SLP asked pt questions and pt answered using his low tech AAC with occasional SLP cues repeating the question 4/4. Pt also successful using keywords, and extra time for 5 other statements pt made 2/5.  SLP had to cue pt to use book and this improved success to 3/5. Pt endorses using his low-tech AAC more often at home than prior to ST.  SLP asacertained today that pt uses menu board to order by reading off what he likes at the stadium, and commends common/rote words phrases (yeah it was, yes, etc) on others' comments at the stadium. He denies requiring a page for [employer]. SLP had pt add 2% and slice (by copying SLP written words) to his book today for more precise verbalization PRN. SLP encouraged pt to invite wife to next session.  05/20/24: Pt explained to SLP using his book that he communicated with [name] since last session, about desire to go to [place], with his book. SLP worked with pt increasing his familiarity with his AAC book, asking him questions he would need to use the book for. He used book correctly 3/3, with extra time. He attempted to tell SLP something about food/drink at [employer] but did not have the necessary pictures in his book. SLP strongly suggested pt have a page for [employer] - pt agreed. SLP added hot and cold weather pictures for pt to communicate weather terms using his book.   05/16/24: Pt entered stating to SLP upon questioning that he has used his picture/AAC book with  [name] in the last week. SLP strongly encouraged pt to use book even for beginning with reading his target word/s as he indicated he only uses book when he absolutely has to. SLP told pt to use book for practice verbalizing target words and explained rationale. SLP worked with pt with family relationships. Pt error awareness with paraphasic semantic errors was only 20%. SLP worked with pt to write down word cues for family relationships and place them in his book for use in future. Prior to using book pt answered questions re: family 20% of the time with correct answers 10%. With his book, pt answered questions 5/5 of the time with correct answers 5/5 with min extra time. SLP used this comparison to encourage pt to use book as much as possible.  05/12/24: Pt with abandoned utterances throughout session - generated 2-4 words and then stopped. Tried to rephrase but often could  not do so. SLP worked with pt on his picture book. Pt stated concrete ideas with the book more successfully than without. Reading the signs on the pictures was helpful but not failsafe - apraxia proved difficult for pt to overcome today. SLP cont to tell pt he will need to use some sort of alternative/augmentative communication device and again explained why for pt.  05/09/24: Pt did not complete QAB today due to appearing frustrated about his anomia. Will complete next session. Today SLP practiced compensatory measures with pt (his book of personally relevant words and his phone). SLP req'd to use mod-max A with pt's maps app to show me the restaurants he and wife enjoy. Pt req'd assistance to search appropriately - he would type one letter and would look for AI suggestions - by 4 reps pt was typing in more letters into search bar but cont'd to look at AI and press incorrect location of multiple location restaurants. SLP to cont to work with pt. With his book, he told SLP which people were alive or deceased, using the printed word 80% of  the time.   05/05/24: SLP reviewed role of SLP in ST, general goals, discussion about how augmentative items can enhance communication and pt's need to embrace these items if he wants to communicate with people.  PATIENT EDUCATION: Education details: see treatment date this date, above Person educated: Patient and Spouse Education method: Explanation, Facilities manager, Verbal cues, and Handouts Education comprehension: verbalized understanding, returned demonstration, verbal cues required, and needs further education   GOALS: Goals reviewed with patient? Yes  SHORT TERM GOALS: Target date: 06/06/24  Pt will complete QAB prior to 05/26/24 Baseline: Goal status: not met - pt does not desire standardized testing for aphasia  2.  Pt will use alternative communication means (low tech) via phone, photo book, or copied onto paper at least once daily between 3 sessions Baseline: 05/16/24, 05/20/24 Goal status: partially met  3.  Pt will abandon <50% of utterances using compensations in one minute of conversational speech Baseline:  Goal status: met  4.  Pt will use two words to describe an item on personally relevant word list twice in one session Baseline:  Goal status: deferred due to picture book use more important at this time   LONG TERM GOALS: Target date:  08/08/24  Pt will improve PROM compared to initial administration Baseline:  Goal status: Taken on last day of ST  2.  Pt will use alternative communication means (low tech) via phone, photo book, or paper at least x5 daily between 2 sessions Baseline:  Goal status: not met and cont  3.  Pt will abandon only 20% of utterances in one minute of conversational speech using compensations  Baseline:  Goal status: not met and cont  4.  Pt will indicate higher frequency of successful communication at baseball games (using compensation) than prior to ST Baseline:  Goal status: not met and cont   ASSESSMENT:  CLINICAL  IMPRESSION: Patient is a 73 y.o. M who was seen today for treatment of language in light of PPA diagnosed 2022. See treatment date above for today's date for further details on today's session. Pt cont to report use of alternative or augmentative means for communication at home. Wife attended session today and was very good to have her in session to assist in SLP planning for further sessions. She does not indicate pt's communication is challenging at home except when pt has difficulty with watching movies on iPad. This is likely more due to frustration than aphasia. It is necessary for pt and wife to learn more about how to use alternative means of communication in order to improve success and effectiveness of communication at home and in the community.   OBJECTIVE IMPAIRMENTS: include awareness, expressive language, receptive language, and aphasia. These impairments are limiting patient from managing medications, managing appointments, managing finances, household responsibilities, ADLs/IADLs, and effectively communicating at home and in community. Factors affecting potential to achieve goals and functional outcome are cooperation/participation level, medical prognosis, previous level of function, and severity of impairments. Patient will benefit from skilled SLP services to address above impairments and improve overall function.  REHAB POTENTIAL: Fair given above factors  PLAN:  SLP FREQUENCY: 2x/week  SLP DURATION: 4 weeks  PLANNED INTERVENTIONS: Language facilitation, Environmental controls, Cueing hierachy, Internal/external aids, Functional tasks, Multimodal communication approach, SLP instruction and feedback, Compensatory strategies, Patient/family education, and [id number] Treatment of speech (30 or 45 min)     Rankin Coolman, CCC-SLP 07/02/2024, 9:35 AM

## 2024-07-10 ENCOUNTER — Ambulatory Visit

## 2024-07-10 DIAGNOSIS — G3101 Pick's disease: Secondary | ICD-10-CM | POA: Diagnosis not present

## 2024-07-10 DIAGNOSIS — F0280 Dementia in other diseases classified elsewhere without behavioral disturbance: Secondary | ICD-10-CM

## 2024-07-10 DIAGNOSIS — R4701 Aphasia: Secondary | ICD-10-CM

## 2024-07-10 NOTE — Therapy (Signed)
 OUTPATIENT SPEECH LANGUAGE PATHOLOGY APHASIA TREATMENT  Patient Name: Robert Mann MRN: [id number] DOB:1950/12/12, 73 y.o., male Today's Date: 07/10/2024  PCP: Tonye Huxley, NP REFERRING PROVIDER: Dina Credit, PA  END OF SESSION:  End of Session - 07/10/24 2303     Visit Number 12    Number of Visits 17    Date for SLP Re-Evaluation 08/08/24    SLP Start Time 1105    SLP Stop Time  1145    SLP Time Calculation (min) 40 min    Activity Tolerance Patient tolerated treatment well                 Past Medical History:  Diagnosis Date   Essential hypertension    History of kidney stones    History of melanoma 01/2018   left chest   History of placement of chest tube 10/1979   History of pneumothorax 1980   Hyperlipidemia    Mild neurocognitive disorder, 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   Past Surgical History:  Procedure Laterality Date   MELANOMA EXCISION WITH SENTINEL LYMPH NODE BIOPSY Left 02/08/2018   Procedure: WIDE EXCISION OF LEFT CHEST MELANOMA AND LEFT AXILLARY SENTINEL LYMPH NODE;  Surgeon: Sebastian Moles, MD;  Location: [facility] OR;  Service: General;  Laterality: Left;   WISDOM TOOTH EXTRACTION     Patient Active Problem List   Diagnosis Date Noted   Mild neurocognitive disorder, 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 Seen for  approx 10 visits of ST in 2022-2023. hypertension, hyperlipidemia, h/o melanoma L chest and L shoulder. 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   PAIN:  Are you having pain? No  FALLS: Has patient fallen in last 6 months?  No   PATIENT GOALS: Improve expressive language  OBJECTIVE:  Note: Objective measures were completed at Evaluation unless otherwise noted.  DIAGNOSTIC FINDINGS:  DINA 04/02/24 [name] is a very pleasant 73 y.o. 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 Patient is currently on donepezil  10 mg daily and memantine  10 mg twice daily.  Memory is stable but he was unable to perform MMSE due to verbal difficulties which became very frustrating to him.  In the past, he has done speech therapy but in view of worsening speech, we discussed referring him again and he agrees .He is able to participate on his ADLs, continues to drive without any issues, mood is good recommend good control of her cardiovascular risk factors    STANDARDIZED ASSESSMENTS: QAB: Pt does not wish standardized assessment.  PATIENT REPORTED OUTCOME MEASURES (PROM): Communication Effectiveness Survey: lower scores indicate greater negative impact on pt's QOL.Pt scored himself 18/32, with 1 (not at all effective)  scored on participating in conversation with strangers in a quiet place. 3 (mostly effective) scored for having a conversation with a family member or friends at home,  conversing with a familiar person over the phone, and having a conversation while traveling in a car. [name] (wife) scored the patient 13/32 with 1 scored on being part of a conversation in a noisy environment, speaking to a friend when  emotionally upset/angry, and having a conversation with someone at a distance/across a room. [name] did not score the patient any 3 (mostly effective). Pt and wife agreed on 2 for conversing over the telephone with a stranger.                                                                                                                            TREATMENT DATE:   07/10/24: Pt's wife added two pages of baseball pictures. Pt stated he brings them to [employer] games but has not used them in two games. Pt spontaneously used book x2 today to communicate ideas, successfully. SLP and pt added concourse to his baseball page. SLP asked pt 5 questions today that he could have used his book and he used x3 with cues from SLP, successfully. Other two req'd SLP cues.   8/6/25BETHA [name] attended with pt today. She shared [name] has difficulty with getting his movie back on his iPad when he goes off and returns. She said there could be one of any different things that have occurred since he left and does not always know how to get back to his movie. [name] has to assist almost 100% of the time,; SLP does not believe this difficulty is due to aphasia but more due to pt's frustration leading to tapping everywhere on the screen. She assists pt and this de-escalates the situation. [name] stated pt's functional use of electronic devices has decreased since PPA, pt is easily frustrated with them. Frustration is also hindering pt using AAC book and communication, overall, on some days - SLP suggested talking to MD about this. Added icon and street names to book. Today SLP ascertained that pt's gas station of choice is [employer] on [address] so SLP added this to pt's book. SLP told pt and wife his homework is to add street names to cue him to say street names when needed.   06/30/24: SLP stressed reason and need for wife to attend therapy. Stated this in multiple ways - pt voiced understanding. Pt used book 3-4 times  since last session.  Today pt appeared more frustrated about his speech than previous 3-4 sessions. Had more difficulty with language today but spontaneously opened book to sports page and indicated which team he follows after baseball season is over. Pt attempted to use book with SLP cue to do so, eventually communicating he picked up chinese food by walking in and waiting for the order to be called. Pt stated he would not take his AAC book to a [employer] game but  would state yeah and other rote words/phrases as he can. SLP encouraged pt to cont to use book as he is able. SLP to cont to practice with pt in session but needs wife to provide description of situations which are challenging at home to give SLP some framework of how to further hone pt's book for maximal use.  06/25/24: The boys gone. Pt indicated that his grandsons have left. SLP learned pt was out doing yardwork and enjoys this. SLP collaborated with pt to add [employer] in his low tech AAC book. Pt chose the page and the configuration of the icons. Pt's verbalization was 90% accurate with pictures and words for mowing grass, trimming, blowing, and cut wood. Pt thanked SLP for assistance today.  06/23/24: Pt communicated to SLP that his grandsons are at his home and he has not gone out to a restaurant. He and SLP spoke mainly about high interest topic - [employer] baseball. Pt functionally communicated message 40% of the time with consistent extra time necessary. Pt abandoned 35% utterances today. To make communication book more functional for pt, SLP assisted pt adding phrases and words into pt's communication book. Pt spontaneously went to his book to communicate in 25% of possible opportunities. SLP cued pt to do so today, and pt did so successfully 50% of the time. SLP specifically requested that wife attend next session to see how things are going at home and how SLP can facilitate better communication at home between wife and pt.    06/16/24: Pt arrived alone. [name] shared with SLP he continues to use his AAC book at home, mostly, but indicated today he would take it to a restaurant instead of having wife order for him or point at his menu selection. SLP worked on Theatre manager AAC book more functional for him; Through conversation with pt it was learned he had shrimp and scallops last week and enjoys this at restaurants. Pt copied this into his fish page in his AAC book. Additionally, pt also enjoys sweet tea so SLP had pt copy this into his drinks page. SLP suspected pt comprehended approx 70% of SLP questions today. SLP rephrased, used slower speech rate, and used written keywords to improve comprehension.  06/09/24: Pt functionally explained about 4th of July fireworks by one of his neighbors with keywords and gestures, upon SLP questinoing about his July 4th.  Today, SLP asked pt questions and pt answered using his low tech AAC with occasional SLP cues repeating the question 4/4. Pt also successful using keywords, and extra time for 5 other statements pt made 2/5.  SLP had to cue pt to use book and this improved success to 3/5. Pt endorses using his low-tech AAC more often at home than prior to ST.  SLP asacertained today that pt uses menu board to order by reading off what he likes at the stadium, and commends common/rote words phrases (yeah it was, yes, etc) on others' comments at the stadium. He denies requiring a page for [employer]. SLP had pt add 2% and slice (by copying SLP written words) to his book today for more precise verbalization PRN. SLP encouraged pt to invite wife to next session.  05/20/24: Pt explained to SLP using his book that he communicated with [name] since last session, about desire to go to [place], with his book. SLP worked with pt increasing his familiarity with his AAC book, asking him questions he would need to use the book for. He used book  correctly 3/3, with extra time. He  attempted to tell SLP something about food/drink at [employer] but did not have the necessary pictures in his book. SLP strongly suggested pt have a page for [employer] - pt agreed. SLP added hot and cold weather pictures for pt to communicate weather terms using his book.   05/16/24: Pt entered stating to SLP upon questioning that he has used his picture/AAC book with [place] in the last week. SLP strongly encouraged pt to use book even for beginning with reading his target word/s as he indicated he only uses book when he absolutely has to. SLP told pt to use book for practice verbalizing target words and explained rationale. SLP worked with pt with family relationships. Pt error awareness with paraphasic semantic errors was only 20%. SLP worked with pt to write down word cues for family relationships and place them in his book for use in future. Prior to using book pt answered questions re: family 20% of the time with correct answers 10%. With his book, pt answered questions 5/5 of the time with correct answers 5/5 with min extra time. SLP used this comparison to encourage pt to use book as much as possible.   PATIENT EDUCATION: Education details: see treatment date this date, above Person educated: Patient Education method: Explanation, Demonstration, and Verbal cues Education comprehension: verbalized understanding, returned demonstration, verbal cues required, and needs further education   GOALS: Goals reviewed with patient? Yes  SHORT TERM GOALS: Target date: 06/06/24  Pt will complete QAB prior to 05/26/24 Baseline: Goal status: not met - pt does not desire standardized testing for aphasia  2.  Pt will use alternative communication means (low tech) via phone, photo book, or copied onto paper at least once daily between 3 sessions Baseline: 05/16/24, 05/20/24 Goal status: partially met  3.  Pt will abandon <50% of utterances using compensations in one minute of  conversational speech Baseline:  Goal status: met  4.  Pt will use two words to describe an item on personally relevant word list twice in one session Baseline:  Goal status: deferred due to picture book use more important at this time   LONG TERM GOALS: Target date:  08/08/24  Pt will improve PROM compared to initial administration Baseline:  Goal status: Taken on last day of ST  2.  Pt will use alternative communication means (low tech) via phone, photo book, or paper at least x5 daily between 2 sessions Baseline:  Goal status: cont  3.  Pt will abandon only 20% of utterances in one minute of conversational speech using compensations  Baseline:  Goal status: met  4.  Pt will indicate higher frequency of successful communication at baseball games (using compensation) than prior to ST Baseline:  Goal status: cont   ASSESSMENT:  CLINICAL IMPRESSION: Patient is a 73 y.o. M who was seen today for treatment of language in light of PPA diagnosed 2022. See treatment date above for today's date for further details on today's session. Pt cont to report use of alternative or augmentative means for communication at home. It is necessary for pt and wife to learn more about how to use alternative means of communication in order to improve success and effectiveness of communication at home and in the community.   OBJECTIVE IMPAIRMENTS: include awareness, expressive language, receptive language, and aphasia. These impairments are limiting patient from managing medications, managing appointments, managing finances, household responsibilities, ADLs/IADLs, and effectively communicating at home and in community. Factors  affecting potential to achieve goals and functional outcome are cooperation/participation level, medical prognosis, previous level of function, and severity of impairments. Patient will benefit from skilled SLP services to address above impairments and improve overall  function.  REHAB POTENTIAL: Fair given above factors  PLAN:  SLP FREQUENCY: 2x/week  SLP DURATION: 4 weeks  PLANNED INTERVENTIONS: Language facilitation, Environmental controls, Cueing hierachy, Internal/external aids, Functional tasks, Multimodal communication approach, SLP instruction and feedback, Compensatory strategies, Patient/family education, and [id number] Treatment of speech (30 or 45 min)     Lawton Dollinger, CCC-SLP 07/10/2024, 11:03 PM

## 2024-07-15 ENCOUNTER — Ambulatory Visit

## 2024-07-15 DIAGNOSIS — G3101 Pick's disease: Secondary | ICD-10-CM | POA: Diagnosis not present

## 2024-07-15 DIAGNOSIS — F0280 Dementia in other diseases classified elsewhere without behavioral disturbance: Secondary | ICD-10-CM | POA: Diagnosis not present

## 2024-07-15 DIAGNOSIS — R4701 Aphasia: Secondary | ICD-10-CM

## 2024-07-15 NOTE — Therapy (Unsigned)
 OUTPATIENT SPEECH LANGUAGE PATHOLOGY APHASIA TREATMENT  Patient Name: Robert Mann MRN: [id number] DOB:May 10, 1951, 73 y.o., male Today's Date: 07/15/2024  PCP: Robert Huxley, Robert Mann REFERRING PROVIDER: Dina Credit, PA  END OF SESSION:  End of Session - 07/15/24 1546     Visit Number 13    Number of Visits 17    Date for SLP Re-Evaluation 08/08/24    SLP Start Time 1538    SLP Stop Time  1616    SLP Time Calculation (min) 38 min    Activity Tolerance Patient tolerated treatment well                 Past Medical History:  Diagnosis Date   Essential hypertension    History of kidney stones    History of melanoma 01/2018   left chest   History of placement of chest tube 10/1979   History of pneumothorax 1980   Hyperlipidemia    Mild neurocognitive disorder, 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   Past Surgical History:  Procedure Laterality Date   MELANOMA EXCISION WITH SENTINEL LYMPH NODE BIOPSY Left 02/08/2018   Procedure: WIDE EXCISION OF LEFT CHEST MELANOMA AND LEFT AXILLARY SENTINEL LYMPH NODE;  Surgeon: Robert Moles, MD;  Location: [facility] OR;  Service: General;  Laterality: Left;   WISDOM TOOTH EXTRACTION     Patient Active Problem List   Diagnosis Date Noted   Mild neurocognitive disorder, 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 Seen for  approx 10 visits of ST in 2022-2023. hypertension, hyperlipidemia, h/o melanoma L chest and L shoulder. 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   PAIN:  Are you having pain? No  FALLS: Has patient fallen in last 6 months?  No   PATIENT GOALS: Improve expressive language  OBJECTIVE:  Note: Objective measures were completed at Evaluation unless otherwise noted.  DIAGNOSTIC FINDINGS:  Robert 04/02/24 Robert Mann is a very pleasant 73 y.o. 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 Patient is currently on donepezil  10 mg daily and memantine  10 mg twice daily.  Memory is stable but he was unable to perform MMSE due to verbal difficulties which became very frustrating to him.  In the past, he has done speech therapy but in view of worsening speech, we discussed referring him again and he agrees .He is able to participate on his ADLs, continues to drive without any issues, mood is good recommend good control of her cardiovascular risk factors    STANDARDIZED ASSESSMENTS: QAB: Pt does not wish standardized assessment.  PATIENT REPORTED OUTCOME MEASURES (PROM): Communication Effectiveness Survey: lower scores indicate greater negative impact on pt's QOL.Pt scored himself 18/32, with 1 (not at all effective)  scored on participating in conversation with strangers in a quiet place. 3 (mostly effective) scored for having a conversation with a family member or friends at home,  conversing with a familiar person over the phone, and having a conversation while traveling in a car. Robert Mann (wife) scored the patient 13/32 with 1 scored on being part of a conversation in a noisy environment, speaking to a friend when  emotionally upset/angry, and having a conversation with someone at a distance/across a room. Robert Mann did not score the patient any 3 (mostly effective). Pt and wife agreed on 2 for conversing over the telephone with a stranger.                                                                                                                            TREATMENT DATE:   07/15/24:   07/10/24: Pt's wife added two pages of baseball pictures. Pt stated he brings them to [employer] games but has not used them in two games. Pt spontaneously used book x2 today to communicate ideas, successfully. SLP and pt added concourse to his baseball page. SLP asked pt 5 questions today that he could have used his book and he used x3 with cues from SLP, successfully. Other two req'd SLP cues.   8/6/25BETHA Robert Mann attended with pt today. She shared Robert Mann has difficulty with getting his movie back on his iPad when he goes off and returns. She said there could be one of any different things that have occurred since he left and does not always know how to get back to his movie. Robert Mann has to assist almost 100% of the time,; SLP does not believe this difficulty is due to aphasia but more due to pt's frustration leading to tapping everywhere on the screen. She assists pt and this de-escalates the situation. Robert Mann stated pt's functional use of electronic devices has decreased since PPA, pt is easily frustrated with them. Frustration is also hindering pt using AAC book and communication, overall, on some days - SLP suggested talking to MD about this. Added icon and street names to book. Today SLP ascertained that pt's gas station of choice is [employer] on [address] so SLP added this to pt's book. SLP told pt and wife his homework is to add street names to cue him to say street names when needed.   06/30/24: SLP stressed reason and need for wife to attend therapy. Stated this in multiple ways - pt voiced understanding. Pt used  book 3-4 times since last session.  Today pt appeared more frustrated about his speech than previous 3-4 sessions. Had more difficulty with language today but spontaneously opened book to sports page and indicated which team he follows after baseball season is over. Pt attempted to use book with SLP cue to do so, eventually communicating he picked up chinese food by walking in and waiting for the order to be called. Pt stated he would not take his AAC book to a  [employer] game but would state yeah and other rote words/phrases as he can. SLP encouraged pt to cont to use book as he is able. SLP to cont to practice with pt in session but needs wife to provide description of situations which are challenging at home to give SLP some framework of how to further hone pt's book for maximal use.  06/25/24: The boys gone. Pt indicated that his grandsons have left. SLP learned pt was out doing yardwork and enjoys this. SLP collaborated with pt to add [employer] in his low tech AAC book. Pt chose the page and the configuration of the icons. Pt's verbalization was 90% accurate with pictures and words for mowing grass, trimming, blowing, and cut wood. Pt thanked SLP for assistance today.  06/23/24: Pt communicated to SLP that his grandsons are at his home and he has not gone out to a restaurant. He and SLP spoke mainly about high interest topic - [employer] baseball. Pt functionally communicated message 40% of the time with consistent extra time necessary. Pt abandoned 35% utterances today. To make communication book more functional for pt, SLP assisted pt adding phrases and words into pt's communication book. Pt spontaneously went to his book to communicate in 25% of possible opportunities. SLP cued pt to do so today, and pt did so successfully 50% of the time. SLP specifically requested that wife attend next session to see how things are going at home and how SLP can facilitate better communication at home  between wife and pt.   06/16/24: Pt arrived alone. [name] shared with SLP he continues to use his AAC book at home, mostly, but indicated today he would take it to a restaurant instead of having wife order for him or point at his menu selection. SLP worked on Theatre manager AAC book more functional for him; Through conversation with pt it was learned he had shrimp and scallops last week and enjoys this at restaurants. Pt copied this into his fish page in his AAC book. Additionally, pt also enjoys sweet tea so SLP had pt copy this into his drinks page. SLP suspected pt comprehended approx 70% of SLP questions today. SLP rephrased, used slower speech rate, and used written keywords to improve comprehension.  06/09/24: Pt functionally explained about 4th of July fireworks by one of his neighbors with keywords and gestures, upon SLP questinoing about his July 4th.  Today, SLP asked pt questions and pt answered using his low tech AAC with occasional SLP cues repeating the question 4/4. Pt also successful using keywords, and extra time for 5 other statements pt made 2/5.  SLP had to cue pt to use book and this improved success to 3/5. Pt endorses using his low-tech AAC more often at home than prior to ST.  SLP asacertained today that pt uses menu board to order by reading off what he likes at the stadium, and commends common/rote words phrases (yeah it was, yes, etc) on others' comments at the stadium. He denies requiring a page for [employer]. SLP had pt add 2% and slice (by copying SLP written words) to his book today for more precise verbalization PRN. SLP encouraged pt to invite wife to next session.  05/20/24: Pt explained to SLP using his book that he communicated with Robert Mann since last session, about desire to go to [place], with his book. SLP worked with pt increasing his familiarity with his AAC book, asking him questions he would need to use the book for.  He used book correctly  3/3, with extra time. He attempted to tell SLP something about food/drink at [employer] but did not have the necessary pictures in his book. SLP strongly suggested pt have a page for [employer] - pt agreed. SLP added hot and cold weather pictures for pt to communicate weather terms using his book.   05/16/24: Pt entered stating to SLP upon questioning that he has used his picture/AAC book with [place] in the last week. SLP strongly encouraged pt to use book even for beginning with reading his target word/s as he indicated he only uses book when he absolutely has to. SLP told pt to use book for practice verbalizing target words and explained rationale. SLP worked with pt with family relationships. Pt error awareness with paraphasic semantic errors was only 20%. SLP worked with pt to write down word cues for family relationships and place them in his book for use in future. Prior to using book pt answered questions re: family 20% of the time with correct answers 10%. With his book, pt answered questions 5/5 of the time with correct answers 5/5 with min extra time. SLP used this comparison to encourage pt to use book as much as possible.   PATIENT EDUCATION: Education details: see treatment date this date, above Person educated: Patient Education method: Explanation, Demonstration, and Verbal cues Education comprehension: verbalized understanding, returned demonstration, verbal cues required, and needs further education   GOALS: Goals reviewed with patient? Yes  SHORT TERM GOALS: Target date: 06/06/24  Pt will complete QAB prior to 05/26/24 Baseline: Goal status: not met - pt does not desire standardized testing for aphasia  2.  Pt will use alternative communication means (low tech) via phone, photo book, or copied onto paper at least once daily between 3 sessions Baseline: 05/16/24, 05/20/24 Goal status: partially met  3.  Pt will abandon <50% of utterances using compensations  in one minute of conversational speech Baseline:  Goal status: met  4.  Pt will use two words to describe an item on personally relevant word list twice in one session Baseline:  Goal status: deferred due to picture book use more important at this time   LONG TERM GOALS: Target date:  08/08/24  Pt will improve PROM compared to initial administration Baseline:  Goal status: Taken on last day of ST  2.  Pt will use alternative communication means (low tech) via phone, photo book, or paper at least x5 daily between 2 sessions Baseline:  Goal status: cont  3.  Pt will abandon only 20% of utterances in one minute of conversational speech using compensations  Baseline:  Goal status: met  4.  Pt will indicate higher frequency of successful communication at baseball games (using compensation) than prior to ST Baseline:  Goal status: cont   ASSESSMENT:  CLINICAL IMPRESSION: Patient is a 73 y.o. M who was seen today for treatment of language in light of PPA diagnosed 2022. See treatment date above for today's date for further details on today's session. Pt cont to report use of alternative or augmentative means for communication at home. It is necessary for pt and wife to learn more about how to use alternative means of communication in order to improve success and effectiveness of communication at home and in the community.   OBJECTIVE IMPAIRMENTS: include awareness, expressive language, receptive language, and aphasia. These impairments are limiting patient from managing medications, managing appointments, managing finances, household responsibilities, ADLs/IADLs, and effectively communicating at home and  in community. Factors affecting potential to achieve goals and functional outcome are cooperation/participation level, medical prognosis, previous level of function, and severity of impairments. Patient will benefit from skilled SLP services to address above impairments and improve  overall function.  REHAB POTENTIAL: Fair given above factors  PLAN:  SLP FREQUENCY: 2x/week  SLP DURATION: 4 weeks  PLANNED INTERVENTIONS: Language facilitation, Environmental controls, Cueing hierachy, Internal/external aids, Functional tasks, Multimodal communication approach, SLP instruction and feedback, Compensatory strategies, Patient/family education, and [id number] Treatment of speech (30 or 45 min)     Hira Trent, CCC-SLP 07/15/2024, 3:47 PM

## 2024-07-22 ENCOUNTER — Ambulatory Visit

## 2024-07-22 DIAGNOSIS — F0280 Dementia in other diseases classified elsewhere without behavioral disturbance: Secondary | ICD-10-CM | POA: Diagnosis not present

## 2024-07-22 DIAGNOSIS — R4701 Aphasia: Secondary | ICD-10-CM

## 2024-07-22 DIAGNOSIS — G3101 Pick's disease: Secondary | ICD-10-CM | POA: Diagnosis not present

## 2024-07-22 NOTE — Therapy (Signed)
 OUTPATIENT SPEECH LANGUAGE PATHOLOGY APHASIA TREATMENT/Discharge summary  Patient Name: Robert Mann MRN: [id number] DOB:1951/06/27, 73 y.o., male Today's Date: 07/22/2024  PCP: Robert Huxley, NP REFERRING PROVIDER: Dina Credit, PA  END OF SESSION:  End of Session - 07/22/24 1144     Visit Number 14    Number of Visits 17    Date for SLP Re-Evaluation 08/08/24    SLP Start Time 0852    SLP Stop Time  0932    SLP Time Calculation (min) 40 min    Activity Tolerance Patient tolerated treatment well                  Past Medical History:  Diagnosis Date   Essential hypertension    History of kidney stones    History of melanoma 01/2018   left chest   History of placement of chest tube 10/1979   History of pneumothorax 1980   Hyperlipidemia    Mild neurocognitive disorder, 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   Past Surgical History:  Procedure Laterality Date   MELANOMA EXCISION WITH SENTINEL LYMPH NODE BIOPSY Left 02/08/2018   Procedure: WIDE EXCISION OF LEFT CHEST MELANOMA AND LEFT AXILLARY SENTINEL LYMPH NODE;  Surgeon: Robert Moles, MD;  Location: [facility] OR;  Service: General;  Laterality: Left;   WISDOM TOOTH EXTRACTION     Patient Active Problem List   Diagnosis Date Noted   Mild neurocognitive disorder, 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 Pt made some minor gains in verbal expression however at this time pt needs to use his low-tech AAC book for supplementing with and as an alternative for verbal communication. Pt cont to be reluctant to use this despite SLP encouragement to do so in almost every session.    Remaining  deficits: Mod-severe expressive language deficit, mild receptive language deficit   Education / Equipment: See individual therapy session notes   Patient agrees to discharge. Patient goals were partially met. Patient is being discharged due to maximized rehab potential. .     ONSET DATE: script 04/02/24  REFERRING DIAG:  H68.98,Q97.19 (ICD-10-CM) - Primary progressive aphasia (HCC)    THERAPY DIAG:  Aphasia  Primary progressive aphasia  Rationale for Evaluation and Treatment: Rehabilitation  SUBJECTIVE:   SUBJECTIVE STATEMENT: Pt brought his low-tech AAC (picture book) with him today.   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 Seen for approx 10 visits of ST in 2022-2023. hypertension, hyperlipidemia, h/o melanoma L chest and L shoulder. 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   PAIN:  Are you having pain? No  FALLS: Has patient fallen in last 6 months?  No   PATIENT GOALS: Improve expressive language  OBJECTIVE:  Note: Objective measures were completed at Evaluation unless otherwise noted.  DIAGNOSTIC FINDINGS:  Robert 04/02/24 Robert Mann is a very pleasant 73 y.o. 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 Patient is currently  on donepezil  10 mg daily and memantine  10 mg twice daily.  Memory is stable but he was unable to perform MMSE due to verbal difficulties which became very frustrating to him.  In the past, he has done speech therapy but in view of worsening speech, we discussed referring him again and he agrees .He is able to participate on his ADLs, continues to drive without any issues, mood is good recommend good control of her  cardiovascular risk factors    STANDARDIZED ASSESSMENTS: QAB: Pt does not wish standardized assessment.  PATIENT REPORTED OUTCOME MEASURES (PROM): Communication Effectiveness Survey: lower scores indicate greater negative impact on pt's QOL.Pt scored himself 18/32, with 1 (not at all effective) scored on participating in conversation with strangers in a quiet place. 3 (mostly effective) scored for having a conversation with a family member or friends at home,  conversing with a familiar person over the phone, and having a conversation while traveling in a car. Robert Mann (wife) scored the patient 13/32 with 1 scored on being part of a conversation in a noisy environment, speaking to a friend when emotionally upset/angry, and having a conversation with someone at a distance/across a room. Robert Mann did not score the patient any 3 (mostly effective). Pt and wife agreed on 2 for conversing over the telephone with a stranger.                                                                                                                            TREATMENT DATE:   07/22/24: Pt arrives alone to last session. Pt indicated he was using his communication book more with his wife this last week but did not use the book outside of home. When asked questions in a slower rate of speech, pt showed SLP correct picture in his book 6/6 with extra time to find page allowed. SLP reiterated pt needs to cont to practice using his book in order to reduce time finding his page and indicating the correct picture. More detailed explanations (details about his daughter Robert Mann) were not possible for pt to convey and pt reverted to saying It's just a mess after attempting to explain verbally. SLP encouraged pt to use his book but he was unable to do so. He did communicate with some assistance from SLP that his son in law works as a Civil engineer, contracting for [employer]. SLP had to use Google maps and type in the  city and pt pointed out the restaurant spontaneously and said [place]. Pt wrote this information in his book near his son in law's picture. SLP got pt's last PROM measure and he scored himself 19/32, a minor improvement from his initial measure. 1 was scored only for conversing with a stranger over the telephone.  07/15/24:  SLP used written cues to augment pt's auditory comprehension today to ensure pt understanding of SLP spoken language. Pt told SLP he has not used AAC book since last session with SLP. SLP role played  with pt and presented hypothetical speaking situations between him and wife. Pt notably avoided using AAC book in some situations and req'd cues from SLP to do so. SLP with strong message to pt today he will have to practice using AAC book because speech going to cont to decline and he needs more practice so that communication is efficient for him when he can no longer communicate effectively via verbal means. Pt appeared disappointed but demonstrated understanding. SLP strongly encouraged pt to practice use AAC book until next session.  07/10/24: Pt's wife added two pages of baseball pictures. Pt stated he brings them to [employer] games but has not used them in two games. Pt spontaneously used book x2 today to communicate ideas, successfully. SLP and pt added concourse to his baseball page. SLP asked pt 5 questions today that he could have used his book and he used x3 with cues from SLP, successfully. Other two req'd SLP cues.   8/6/25BETHA Jean attended with pt today. She shared [name] has difficulty with getting his movie back on his iPad when he goes off and returns. She said there could be one of any different things that have occurred since he left and does not always know how to get back to his movie. Jean has to assist almost 100% of the time,; SLP does not believe this difficulty is due to aphasia but more due to pt's frustration leading to tapping everywhere on the  screen. She assists pt and this de-escalates the situation. Jean stated pt's functional use of electronic devices has decreased since PPA, pt is easily frustrated with them. Frustration is also hindering pt using AAC book and communication, overall, on some days - SLP suggested talking to MD about this. Added icon and street names to book. Today SLP ascertained that pt's gas station of choice is [employer] on [address] so SLP added this to pt's book. SLP told pt and wife his homework is to add street names to cue him to say street names when needed.   06/30/24: SLP stressed reason and need for wife to attend therapy. Stated this in multiple ways - pt voiced understanding. Pt used book 3-4 times since last session.  Today pt appeared more frustrated about his speech than previous 3-4 sessions. Had more difficulty with language today but spontaneously opened book to sports page and indicated which team he follows after baseball season is over. Pt attempted to use book with SLP cue to do so, eventually communicating he picked up chinese food by walking in and waiting for the order to be called. Pt stated he would not take his AAC book to a [employer] game but would state yeah and other rote words/phrases as he can. SLP encouraged pt to cont to use book as he is able. SLP to cont to practice with pt in session but needs wife to provide description of situations which are challenging at home to give SLP some framework of how to further hone pt's book for maximal use.  06/25/24: The boys gone. Pt indicated that his grandsons have left. SLP learned pt was out doing yardwork and enjoys this. SLP collaborated with pt to add [employer] in his low tech AAC book. Pt chose the page and the configuration of the icons. Pt's verbalization was 90% accurate with pictures and words for mowing grass, trimming, blowing, and cut wood. Pt thanked SLP for assistance today.  06/23/24: Pt communicated to SLP that his grandsons  are at his home and  he has not gone out to [employer]. He and SLP spoke mainly about high interest topic - [employer] baseball. Pt functionally communicated message 40% of the time with consistent extra time necessary. Pt abandoned 35% utterances today. To make communication book more functional for pt, SLP assisted pt adding phrases and words into pt's communication book. Pt spontaneously went to his book to communicate in 25% of possible opportunities. SLP cued pt to do so today, and pt did so successfully 50% of the time. SLP specifically requested that wife attend next session to see how things are going at home and how SLP can facilitate better communication at home between wife and pt.   06/16/24: Pt arrived alone. [name] shared with SLP he continues to use his AAC book at home, mostly, but indicated today he would take it to a restaurant instead of having wife order for him or point at his menu selection. SLP worked on Theatre manager AAC book more functional for him; Through conversation with pt it was learned he had shrimp and scallops last week and enjoys this at restaurants. Pt copied this into his fish page in his AAC book. Additionally, pt also enjoys sweet tea so SLP had pt copy this into his drinks page. SLP suspected pt comprehended approx 70% of SLP questions today. SLP rephrased, used slower speech rate, and used written keywords to improve comprehension.  06/09/24: Pt functionally explained about 4th of July fireworks by one of his neighbors with keywords and gestures, upon SLP questinoing about his July 4th.  Today, SLP asked pt questions and pt answered using his low tech AAC with occasional SLP cues repeating the question 4/4. Pt also successful using keywords, and extra time for 5 other statements pt made 2/5.  SLP had to cue pt to use book and this improved success to 3/5. Pt endorses using his low-tech AAC more often at home than prior to ST.  SLP asacertained today that pt  uses menu board to order by reading off what he likes at the stadium, and commends common/rote words phrases (yeah it was, yes, etc) on others' comments at the stadium. He denies requiring a page for [employer]. SLP had pt add 2% and slice (by copying SLP written words) to his book today for more precise verbalization PRN. SLP encouraged pt to invite wife to next session.  05/20/24: Pt explained to SLP using his book that he communicated with Robert Mann since last session, about desire to go to [place], with his book. SLP worked with pt increasing his familiarity with his AAC book, asking him questions he would need to use the book for. He used book correctly 3/3, with extra time. He attempted to tell SLP something about food/drink at [employer] but did not have the necessary pictures in his book. SLP strongly suggested pt have a page for [employer] - pt agreed. SLP added hot and cold weather pictures for pt to communicate weather terms using his book.   05/16/24: Pt entered stating to SLP upon questioning that he has used his picture/AAC book with [place] in the last week. SLP strongly encouraged pt to use book even for beginning with reading his target word/s as he indicated he only uses book when he absolutely has to. SLP told pt to use book for practice verbalizing target words and explained rationale. SLP worked with pt with family relationships. Pt error awareness with paraphasic semantic errors was only 20%. SLP worked with pt to write  down word cues for family relationships and place them in his book for use in future. Prior to using book pt answered questions re: family 20% of the time with correct answers 10%. With his book, pt answered questions 5/5 of the time with correct answers 5/5 with min extra time. SLP used this comparison to encourage pt to use book as much as possible.   PATIENT EDUCATION: Education details: see treatment date this date, above Person  educated: Patient Education method: Explanation, Demonstration, and Verbal cues Education comprehension: verbalized understanding, returned demonstration, verbal cues required, and needs further education   GOALS: Goals reviewed with patient? Yes  SHORT TERM GOALS: Target date: 06/06/24  Pt will complete QAB prior to 05/26/24 Baseline: Goal status: not met - pt does not desire standardized testing for aphasia  2.  Pt will use alternative communication means (low tech) via phone, photo book, or copied onto paper at least once daily between 3 sessions Baseline: 05/16/24, 05/20/24 Goal status: partially met  3.  Pt will abandon <50% of utterances using compensations in one minute of conversational speech Baseline:  Goal status: met  4.  Pt will use two words to describe an item on personally relevant word list twice in one session Baseline:  Goal status: deferred due to picture book use more important at this time   LONG TERM GOALS: Target date:  08/08/24  Pt will improve PROM compared to initial administration Baseline:  Goal status: met  2.  Pt will use alternative communication means (low tech) via phone, photo book, or paper at least x5 daily between 2 sessions Baseline:  Goal status: not met  3.  Pt will abandon only 20% of utterances in one minute of conversational speech using compensations  Baseline:  Goal status: met  4.  Pt will indicate higher frequency of successful communication at baseball games (using compensation) than prior to ST Baseline:  Goal status: not met   ASSESSMENT:  CLINICAL IMPRESSION: Patient is a 73 y.o. M who was seen today for treatment of language in light of PPA diagnosed 2022. See treatment date above for today's date for further details on today's session. Pt reported he did use AAC book for communication at home since last ST session but did not take outside the home. 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 SLP and pt agreed with d/c today.  OBJECTIVE IMPAIRMENTS: include awareness, expressive language, receptive language, and aphasia. These impairments are limiting patient from managing medications, managing appointments, managing finances, household responsibilities, ADLs/IADLs, and effectively communicating at home and in community. Factors affecting potential to achieve goals and functional outcome are cooperation/participation level, medical prognosis, previous level of function, and severity of impairments. Patient will benefit from skilled SLP services to address above impairments and improve overall function.  REHAB POTENTIAL: Fair given above factors  PLAN:  discharge today  PLANNED INTERVENTIONS: Language facilitation, Environmental controls, Cueing hierachy, Internal/external aids, Functional tasks, Multimodal communication approach, SLP instruction and feedback, Compensatory strategies, Patient/family education, and [id number] Treatment of speech (30 or 45 min)     Naziyah Tieszen, CCC-SLP 07/22/2024, 11:45 AM

## 2024-09-15 ENCOUNTER — Other Ambulatory Visit: Payer: Self-pay | Admitting: Physician Assistant

## 2024-10-06 ENCOUNTER — Encounter

## 2024-10-06 NOTE — Progress Notes (Signed)
 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 RH male with a history ofhypertension, hyperlipidemia, h/o melanoma L chest and L shoulder and a diagnosis of severe MCI and PPA per Neuropsych evaluation seen today in follow up for memory loss. Patient is currently on donepezil  10 mg daily and memantine  10 mg twice daily.  Unable to perform MMSE due to verbal difficulties which become very frustrating to him.***.  Patient is able to participate on ADLs***and to drive without significant difficulties.  Mood is***    Follow up in   months. Continue donepezil  10 mg daily and memantine  10 mg twice daily, side effects discussed*** Recommend good control of her cardiovascular risk factors Continue to control mood as per PCP     Subjective:    This patient is accompanied in the office by his wife who supplements the history.  Previous records as well as any outside records available were reviewed prior to todays visit. Patient was last seen on 04/02/2024    Any changes in memory since last visit? Denies, but wife reports that he is starting to forget little thing such as closing the fridge door.  Speech is about the same without improvememnt   Disoriented when walking into a room? Denies    Leaving objects?  May misplace things but not in unusual places   Wandering behavior?  denies   Any personality changes since last visit?  Denies.   Any worsening depression?:  Denies.   Hallucinations or paranoia?  Denies.   Seizures? denies    Any sleep changes Sleeps well.  Denies vivid dreams, REM behavior or sleepwalking   Sleep apnea?   Denies.   Any hygiene concerns? He needs a little reminder just started Independent of bathing and dressing?  Endorsed  Does the patient needs help with medications?  Patient  is in charge   Who is in charge of the finances? Wife is in charge     Any changes in  appetite?  Denies.     Patient have trouble swallowing? Denies.   Does the patient cook?  Yes, he likes to barbecue Any headaches?   denies   Any vision changes? Denies Chronic back pain  denies   Ambulates with difficulty? Denies.    Recent falls or head injuries? 6 weeks ago he hit his L forehead with the cabinet door. No LOC.    Unilateral weakness, numbness or tingling? denies   Any tremors?  Vey mild intention*** Any anosmia?  Denies   Any incontinence of urine?  Endorsed   Any bowel dysfunction?   Denies      Patient lives with his wife  Does the patient drive?  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      Neuropsychological evaluation on 09/02/2021 Dr. Richie Briefly, results suggested diffuse cognitive impairment relative to age-matched peers. He did exhibit appropriate performances across visuospatial abilities and confrontation naming. However, all other assessed domains exhibited severe cognitive impairment. 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 This neurodegenerative disease process is characterized by pronounced impairment with language and executive dysfunction when in earlier stages. While Robert Mann showed relatively diffuse impairment, these areas arguably exhibited greatest impairment. In particular, the non-fluent PPA subtype is  characterized by the evolution of significant word finding difficulties and halting, effortful speech. These symptoms were very prevalent during the current evaluation, as well as reported by Robert Mann and his wife as being present and worsening over the past 1-2 years. Specific to Alzheimer's disease, Robert Mann did show significant memory impairment across testing. However, it is worth  highlighting that he exhibited appropriate retention scores across 3/4 memory tasks. This would suggest that greater memory difficulty is across encoding (i.e., learning) and retrieval aspects of memory, likely worsened by profound deficits in receptive and expressive language. Strong retention scores would not suggest a primary memory storage deficit, which would be inconsistent with typically presenting Alzheimer's disease  When meeting with his neurologist, I would recommend that they discuss neurological medications which may slow decline. It is important to highlight that these medications may slow functional decline in some individuals. Unfortunately, there is no current treatment which is able to stop or reverse these changes. To aid with further diagnostic clarity, they could also discuss the pros and cons of an FDG-PET scan as this can yield helpful information in cases such as this.       Initial Visit  09/15/21 The patient is seen in neurologic consultation at the request of Mann, [name], NP for the evaluation of memory loss in the setting of Primary Progressive Aphasia.  The patient is here alone.  This is a 73 year old right-handed male who had memory issues for about 2 years.  He describes it as I have the words in my head, but can get them out .  This has been worse over the last 6 months.  He lives with his wife who has also noticed the same changes.  His mood is good, but he is also very cautious about talking to people, which has made him more withdrawn, and reviewed that he is afraid that people will notice.  He denies depression or irritability.  He likes to read, but he has to sometimes read the same article several times until getting it .  During the visit, he has shown some frustration in trying to find the right word.  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  He  noted yes the porch.   He sleeps well, denies vivid dreams or sleepwalking, hallucinations or paranoia.  He denies leaving objects in unusual places, but cannot remember at times where he left him.  He is independent of bathing and dressing, no issues with hygiene.  He does not forget to take his medications, he is in charge of his finances, denies missing any bills.  His appetite is good, denies any trouble swallowing, or sialorrhea.  He has a history of reflux, that at times causes him to have some throat discomfort.  His wife does the cooking.  He ambulates about 10 miles a day without difficulties, denies any falls.  Over the years he had several minor injuries in the head, stating that I build stuff and hit my head several times .  He continues to drive without getting lost.  He denies any headaches, double vision, dizziness, focal numbness or tingling, unilateral weakness, tremors or anosmia.  No history of seizures.  Denies urine incontinence, retention constipation or diarrhea.  He denies a history of sleep apnea, but has he does say that at times he notices snoring.  He denies a history of alcohol or tobacco.  Family history negative for dementia.     CURRENT MEDICATIONS:  Outpatient Encounter Medications as of 10/07/2024  Medication Sig   amLODipine  (NORVASC ) 5 MG tablet TAKE 1 TABLET (5 MG TOTAL) BY MOUTH DAILY. SCHEDULE APPT FOR ANY FUTURE REFILLSTAKE 1 TABLET (5 MG TOTAL) BY MOUTH DAILY.   donepezil  (ARICEPT ) 10 MG tablet TAKE 1 TABLET BY MOUTH AT NIGHT   fexofenadine (ALLEGRA ODT) 30 MG disintegrating tablet Take 30 mg by mouth daily.   losartan  (COZAAR ) 25 MG tablet TAKE 1 TABLET (25 MG TOTAL) BY MOUTH DAILY.   memantine  (NAMENDA ) 10 MG tablet TAKE 1 TABLET BY MOUTH TWICE A DAY   Multiple Vitamins-Minerals (CENTRUM SILVER PO) Take 1 tablet by mouth daily.   naproxen sodium (ALEVE) 220 MG tablet Take 220 mg by mouth as needed.   No facility-administered encounter medications on file as  of 10/07/2024.       04/25/2023   12:00 PM 10/13/2022    9:00 AM  MMSE - Mini Mental State Exam  Orientation to time 4 4  Orientation to Place 5 5  Registration 2 3  Attention/ Calculation 0 2  Recall 2 3  Language- name 2 objects 2 2  Language- repeat 0 0  Language- follow 3 step command 3 3  Language- read & follow direction 1 1  Write a sentence 0 0  Copy design 0 1  Total score 19 24       No data to display          Objective:     PHYSICAL EXAMINATION:    VITALS:  There were no vitals filed for this visit.  GEN:  The patient appears stated age and is in NAD. HEENT:  Normocephalic, atraumatic.   Neurological examination:  General: NAD, well-groomed, appears stated age. Orientation: The patient is alert. Oriented to person, place and date Cranial nerves: There is good facial symmetry.The speech not fluent due to aphasia, no dysarthria.  Fund of knowledge is appropriate. Recent and remote memory are impaired. Attention and concentration are reduced. Able to name objects and unable to repeat phrases due to aphasia.  Hearing is intact to conversational tone. *** Sensation: Sensation is intact to light touch throughout Motor: Strength is at least antigravity x4. DTR's 2/4 in UE/LE     Movement examination: Tone: There is normal tone in the UE/LE Abnormal movements:  no tremor.  No myoclonus.  No asterixis.   Coordination:  There is no decremation with RAM's. Normal finger to nose  Gait and Station: The patient has no*** difficulty arising out of a deep-seated chair without the use of the hands. The patient's stride length is good.  Gait is cautious and narrow.    Thank you for allowing us  the opportunity to participate in the care of this nice patient. Please do not hesitate to contact us  for any questions or concerns.   Total time spent on today's visit was *** minutes dedicated to this patient today, preparing to see patient, examining the patient, ordering  tests and/or medications and counseling the patient, documenting clinical information in the EHR or other health record, independently interpreting results and communicating results to the patient/family, discussing treatment and goals, answering patient's questions and coordinating care.  Cc:  Merna Huxley, NP  Camie Sevin 10/06/2024 6:45 AM

## 2024-10-07 ENCOUNTER — Encounter: Payer: Self-pay | Admitting: Physician Assistant

## 2024-10-07 ENCOUNTER — Ambulatory Visit: Admitting: Physician Assistant

## 2024-10-07 VITALS — BP 147/97 | HR 97 | Resp 20 | Ht 70.0 in | Wt 177.0 lb

## 2024-10-07 DIAGNOSIS — G3184 Mild cognitive impairment, so stated: Secondary | ICD-10-CM

## 2024-10-07 DIAGNOSIS — F0280 Dementia in other diseases classified elsewhere without behavioral disturbance: Secondary | ICD-10-CM | POA: Diagnosis not present

## 2024-10-07 DIAGNOSIS — G3101 Pick's disease: Secondary | ICD-10-CM | POA: Diagnosis not present

## 2024-10-07 NOTE — Patient Instructions (Signed)
 It was a pleasure to see you today at our office.   Recommendations:  Meds: Follow up in 6  months   Continue donepezil  10 mg nightly  Continue Memantine  10 mg tablets  twice daily.         RECOMMENDATIONS FOR ALL PATIENTS WITH MEMORY PROBLEMS: 1. Continue to exercise (Recommend 30 minutes of walking everyday, or 3 hours every week) 2. Increase social interactions - continue going to [place] and enjoy social gatherings with friends and family 3. Eat healthy, avoid fried foods and eat more fruits and vegetables 4. Maintain adequate blood pressure, blood sugar, and blood cholesterol level. Reducing the risk of stroke and cardiovascular disease also helps promoting better memory. 5. Avoid stressful situations. Live a simple life and avoid aggravations. Organize your time and prepare for the next day in anticipation. 6. Sleep well, avoid any interruptions of sleep and avoid any distractions in the bedroom that may interfere with adequate sleep quality 7. Avoid sugar, avoid sweets as there is a strong link between excessive sugar intake, diabetes, and cognitive impairment We discussed the Mediterranean diet, which has been shown to help patients reduce the risk of progressive memory disorders and reduces cardiovascular risk. This includes eating fish, eat fruits and green leafy vegetables, nuts like almonds and hazelnuts, walnuts, and also use olive oil. Avoid fast foods and fried foods as much as possible. Avoid sweets and sugar as sugar use has been linked to worsening of memory function.  There is always a concern of gradual progression of memory problems. If this is the case, then we may need to adjust level of care according to patient needs. Support, both to the patient and caregiver, should then be put into place.       FALL PRECAUTIONS: Be cautious when walking. Scan the area for obstacles that may increase the risk of trips and falls. When getting up in the mornings, sit up at the edge  of the bed for a few minutes before getting out of bed. Consider elevating the bed at the head end to avoid drop of blood pressure when getting up. Walk always in a well-lit room (use night lights in the walls). Avoid area rugs or power cords from appliances in the middle of the walkways. Use a walker or a cane if necessary and consider physical therapy for balance exercise. Get your eyesight checked regularly.  FINANCIAL OVERSIGHT: Supervision, especially oversight when making financial decisions or transactions is also recommended.  HOME SAFETY: Consider the safety of the kitchen when operating appliances like stoves, microwave oven, and blender. Consider having supervision and share cooking responsibilities until no longer able to participate in those. Accidents with firearms and other hazards in the house should be identified and addressed as well.   ABILITY TO BE LEFT ALONE: If patient is unable to contact 911 operator, consider using LifeLine, or when the need is there, arrange for someone to stay with patients. Smoking is a fire hazard, consider supervision or cessation. Risk of wandering should be assessed by caregiver and if detected at any point, supervision and safe proof recommendations should be instituted.  MEDICATION SUPERVISION: Inability to self-administer medication needs to be constantly addressed. Implement a mechanism to ensure safe administration of the medications.   DRIVING: Regarding driving, in patients with progressive memory problems, driving will be impaired. We advise to have someone else do the driving if trouble finding directions or if minor accidents are reported. Independent driving assessment is available to determine safety of  driving.   If you are interested in the driving assessment, you can contact the following:  [employer] in [place] [phone]  [facility] [phone]  [facility]  [phone] or [phone]      Mediterranean Diet A Mediterranean diet refers to food and lifestyle choices that are based on the traditions of countries located on the [employer]. This way of eating has been shown to help prevent certain conditions and improve outcomes for people who have chronic diseases, like kidney disease and heart disease. What are tips for following this plan? Lifestyle  Cook and eat meals together with your family, when possible. Drink enough fluid to keep your urine clear or pale yellow. Be physically active every day. This includes: Aerobic exercise like running or swimming. Leisure activities like gardening, walking, or housework. Get 7-8 hours of sleep each night. If recommended by your health care provider, drink red wine in moderation. This means 1 glass a day for nonpregnant women and 2 glasses a day for men. A glass of wine equals 5 oz (150 mL). Reading food labels  Check the serving size of packaged foods. For foods such as rice and pasta, the serving size refers to the amount of cooked product, not dry. Check the total fat in packaged foods. Avoid foods that have saturated fat or trans fats. Check the ingredients list for added sugars, such as corn syrup. Shopping  At the grocery store, buy most of your food from the areas near the walls of the store. This includes: Fresh fruits and vegetables (produce). Grains, beans, nuts, and seeds. Some of these may be available in unpackaged forms or large amounts (in bulk). Fresh seafood. Poultry and eggs. Low-fat dairy products. Buy whole ingredients instead of prepackaged foods. Buy fresh fruits and vegetables in-season from local farmers markets. Buy frozen fruits and vegetables in resealable bags. If you do not have access to quality fresh seafood, buy precooked frozen shrimp or canned fish, such as tuna, salmon, or sardines. Buy small amounts of raw or cooked  vegetables, salads, or olives from the deli or salad bar at your store. Stock your pantry so you always have certain foods on hand, such as olive oil, canned tuna, canned tomatoes, rice, pasta, and beans. Cooking  Cook foods with extra-virgin olive oil instead of using butter or other vegetable oils. Have meat as a side dish, and have vegetables or grains as your main dish. This means having meat in small portions or adding small amounts of meat to foods like pasta or stew. Use beans or vegetables instead of meat in common dishes like chili or lasagna. Experiment with different cooking methods. Try roasting or broiling vegetables instead of steaming or sauteing them. Add frozen vegetables to soups, stews, pasta, or rice. Add nuts or seeds for added healthy fat at each meal. You can add these to yogurt, salads, or vegetable dishes. Marinate fish or vegetables using olive oil, lemon juice, garlic, and fresh herbs. Meal planning  Plan to eat 1 vegetarian meal one day each week. Try to work up to 2 vegetarian meals, if possible. Eat seafood 2 or more times a week. Have healthy snacks readily available, such as: Vegetable sticks with hummus. Greek yogurt. Fruit and nut trail mix. Eat balanced meals throughout the week. This includes: Fruit: 2-3 servings a day Vegetables: 4-5 servings a day Low-fat dairy: 2 servings a day Fish, poultry, or lean meat: 1 serving a day Beans  and legumes: 2 or more servings a week Nuts and seeds: 1-2 servings a day Whole grains: 6-8 servings a day Extra-virgin olive oil: 3-4 servings a day Limit red meat and sweets to only a few servings a month What are my food choices? Mediterranean diet Recommended Grains: Whole-grain pasta. Brown rice. Bulgar wheat. Polenta. Couscous. Whole-wheat bread. [place] Madeira. Vegetables: Artichokes. Beets. Broccoli. Cabbage. Carrots. Eggplant. Green beans. Chard. Kale. Spinach. Onions. Leeks. Peas. Squash. Tomatoes. Peppers.  Radishes. Fruits: Apples. Apricots. Avocado. Berries. Bananas. Cherries. Dates. Figs. Grapes. Lemons. Melon. Oranges. Peaches. Plums. Pomegranate. Meats and other protein foods: Beans. Almonds. Sunflower seeds. Pine nuts. Peanuts. Cod. Salmon. Scallops. Shrimp. Tuna. Tilapia. Clams. Oysters. Eggs. Dairy: Low-fat milk. Cheese. Greek yogurt. Beverages: Water. Red wine. Herbal tea. Fats and oils: Extra virgin olive oil. Avocado oil. Grape seed oil. Sweets and desserts: Greek yogurt with honey. Baked apples. Poached pears. Trail mix. Seasoning and other foods: Basil. Cilantro. Coriander. Cumin. Mint. Parsley. Sage. Rosemary. Tarragon. Garlic. Oregano. Thyme. Pepper. Balsalmic vinegar. Tahini. Hummus. Tomato sauce. Olives. Mushrooms. Limit these Grains: Prepackaged pasta or rice dishes. Prepackaged cereal with added sugar. Vegetables: Deep fried potatoes (french fries). Fruits: Fruit canned in syrup. Meats and other protein foods: Beef. Pork. Lamb. Poultry with skin. Hot dogs. [place]. Dairy: Ice cream. Sour cream. Whole milk. Beverages: Juice. Sugar-sweetened soft drinks. Beer. Liquor and spirits. Fats and oils: Butter. Canola oil. Vegetable oil. Beef fat (tallow). Lard. Sweets and desserts: Cookies. Cakes. Pies. Candy. Seasoning and other foods: Mayonnaise. Premade sauces and marinades. The items listed may not be a complete list. Talk with your dietitian about what dietary choices are right for you. Summary The Mediterranean diet includes both food and lifestyle choices. Eat a variety of fresh fruits and vegetables, beans, nuts, seeds, and whole grains. Limit the amount of red meat and sweets that you eat. Talk with your health care provider about whether it is safe for you to drink red wine in moderation. This means 1 glass a day for nonpregnant women and 2 glasses a day for men. A glass of wine equals 5 oz (150 mL). This information is not intended to replace advice given to you by your health  care provider. Make sure you discuss any questions you have with your health care provider. Document Released: 07/06/2016 Document Revised: 08/08/2016 Document Reviewed: 07/06/2016 Elsevier Interactive Patient Education  2017 [employer].

## 2024-10-12 ENCOUNTER — Other Ambulatory Visit: Payer: Self-pay | Admitting: Physician Assistant

## 2024-11-17 NOTE — Progress Notes (Addendum)
 Korvin Valentine                                          MRN: [id number]   11/17/2024   The [facility] Quality Team Specialist reviewed this patient medical record for the purposes of chart review for care gap closure. The following were reviewed: chart review for care gap closure-controlling blood pressure.  12/10/2024- no cbp to close 2025    [facility] Quality Team

## 2024-12-13 ENCOUNTER — Other Ambulatory Visit: Payer: Self-pay | Admitting: Adult Health

## 2024-12-13 DIAGNOSIS — I10 Essential (primary) hypertension: Secondary | ICD-10-CM

## 2024-12-26 ENCOUNTER — Ambulatory Visit: Payer: Self-pay | Admitting: Adult Health

## 2024-12-26 ENCOUNTER — Encounter: Payer: Self-pay | Admitting: Adult Health

## 2024-12-26 ENCOUNTER — Ambulatory Visit: Admitting: Adult Health

## 2024-12-26 VITALS — BP 142/82 | HR 86 | Temp 97.6°F | Ht 69.0 in | Wt 174.0 lb

## 2024-12-26 DIAGNOSIS — E782 Mixed hyperlipidemia: Secondary | ICD-10-CM | POA: Diagnosis not present

## 2024-12-26 DIAGNOSIS — G3101 Pick's disease: Secondary | ICD-10-CM

## 2024-12-26 DIAGNOSIS — F0280 Dementia in other diseases classified elsewhere without behavioral disturbance: Secondary | ICD-10-CM | POA: Diagnosis not present

## 2024-12-26 DIAGNOSIS — F09 Unspecified mental disorder due to known physiological condition: Secondary | ICD-10-CM

## 2024-12-26 DIAGNOSIS — Z0000 Encounter for general adult medical examination without abnormal findings: Secondary | ICD-10-CM

## 2024-12-26 DIAGNOSIS — I10 Essential (primary) hypertension: Secondary | ICD-10-CM | POA: Diagnosis not present

## 2024-12-26 DIAGNOSIS — Z125 Encounter for screening for malignant neoplasm of prostate: Secondary | ICD-10-CM | POA: Diagnosis not present

## 2024-12-26 LAB — PSA: PSA: 2.4 ng/mL (ref 0.10–4.00)

## 2024-12-26 LAB — COMPREHENSIVE METABOLIC PANEL WITH GFR
ALT: 18 U/L (ref 3–53)
AST: 16 U/L (ref 5–37)
Albumin: 4.4 g/dL (ref 3.5–5.2)
Alkaline Phosphatase: 65 U/L (ref 39–117)
BUN: 22 mg/dL (ref 6–23)
CO2: 26 meq/L (ref 19–32)
Calcium: 9.4 mg/dL (ref 8.4–10.5)
Chloride: 107 meq/L (ref 96–112)
Creatinine, Ser: 1.17 mg/dL (ref 0.40–1.50)
GFR: 62.32 mL/min
Glucose, Bld: 99 mg/dL (ref 70–99)
Potassium: 4.5 meq/L (ref 3.5–5.1)
Sodium: 144 meq/L (ref 135–145)
Total Bilirubin: 0.7 mg/dL (ref 0.2–1.2)
Total Protein: 7.2 g/dL (ref 6.0–8.3)

## 2024-12-26 LAB — CBC
HCT: 51.2 % (ref 39.0–52.0)
Hemoglobin: 17.2 g/dL — ABNORMAL HIGH (ref 13.0–17.0)
MCHC: 33.6 g/dL (ref 30.0–36.0)
MCV: 89.1 fl (ref 78.0–100.0)
Platelets: 235 10*3/uL (ref 150.0–400.0)
RBC: 5.74 Mil/uL (ref 4.22–5.81)
RDW: 13.6 % (ref 11.5–15.5)
WBC: 7.2 10*3/uL (ref 4.0–10.5)

## 2024-12-26 LAB — LIPID PANEL
Cholesterol: 211 mg/dL — ABNORMAL HIGH (ref 28–200)
HDL: 40.6 mg/dL
LDL Cholesterol: 145 mg/dL — ABNORMAL HIGH (ref 10–99)
NonHDL: 170.65
Total CHOL/HDL Ratio: 5
Triglycerides: 130 mg/dL (ref 10.0–149.0)
VLDL: 26 mg/dL (ref 0.0–40.0)

## 2024-12-26 MED ORDER — AMLODIPINE BESYLATE 5 MG PO TABS: 5.0000 mg | ORAL_TABLET | Freq: Every day | ORAL | 3 refills | Status: AC

## 2024-12-26 MED ORDER — LOSARTAN POTASSIUM 25 MG PO TABS: 25.0000 mg | ORAL_TABLET | Freq: Every day | ORAL | 3 refills | Status: AC

## 2024-12-26 NOTE — Progress Notes (Signed)
 "  Subjective:    Patient ID: Robert Mann, male    DOB: [birth year], 74 y.o.   MRN: [id number]  HPI Patient presents for yearly preventative medicine examination. 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  HTN -currently prescribed Norvasc  5 mg and losartan  25 mg daily.  He denies dizziness, lightheadedness, chest pain, or shortness of breath.  Does monitor his blood pressures at home periodically and reports readings in the 120s to 130s over 70s to 80s. BP Readings from Last 3 Encounters:  12/26/24 (!) 180/120  10/07/24 (!) 147/97  04/02/24 (!) 150/89   Hyperlipidemia -has refused statins in the past and continues to refuse. Lab Results  Component Value Date   CHOL 262 (H) 12/26/2023   HDL 45.40 12/26/2023   LDLCALC 190 (H) 12/26/2023   LDLDIRECT 195.0 12/22/2022   TRIG 134.0 12/26/2023   CHOLHDL 6 12/26/2023   Primary progressive aphasia-he is seen by neurology for this.  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  Currently managed with Aricept  10 mg daily and memantine  10 mg BID.  His aphasia and memory seem to be stable. He continues to be independents of ADLS but has stopped driving   All immunizations and health maintenance protocols were reviewed with the patient and needed orders were placed. He is up to date on vaccinations   Appropriate screening laboratory values were ordered for the patient including screening of hyperlipidemia, renal function and hepatic function. If indicated by BPH, a PSA was ordered.  Medication reconciliation,  past medical history, social  history, problem list and allergies were reviewed in detail with the patient  Goals were established with regard to weight loss, exercise, and  diet in compliance with medications  He is up to date on routine colon cancer screening     Review of Systems  Constitutional: Negative.   HENT: Negative.    Eyes: Negative.   Respiratory: Negative.    Cardiovascular: Negative.   Gastrointestinal: Negative.   Endocrine: Negative.   Genitourinary: Negative.   Musculoskeletal: Negative.   Skin: Negative.   Allergic/Immunologic: Negative.   Neurological:  Positive for speech difficulty.  Hematological: Negative.   Psychiatric/Behavioral: Negative.    All other systems reviewed and are negative.  Past Medical History:  Diagnosis Date   Essential hypertension    History of kidney stones    History of melanoma 01/2018   left chest   History of placement of chest tube 10/1979   History of pneumothorax 1980   Hyperlipidemia    Mild neurocognitive disorder, 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    Social History   Socioeconomic History   Marital status: Married    Spouse name: Not on file   Number of children: 2   Years of education: 14   Highest education level: Some college, no degree  Occupational History   Occupation: part time   Tobacco Use   Smoking status: Former    Current packs/day: 0.00    Types: Cigarettes    Quit date: 08/08/1980    Years since quitting: 44.4   Smokeless tobacco: Never   Tobacco comments:    Quit smoking cigarettes in  1981  Vaping Use   Vaping status: Never Used  Substance and Sexual Activity   Alcohol use: Yes    Comment: social    Drug use: No   Sexual activity: Not on file  Other Topics Concern   Not on file  Social History Narrative   Working PT; job requires a tremendous amount of walking   Married    3 children   Social Drivers of Health   Tobacco Use: Medium Risk (12/26/2024)   Patient History    Smoking  Tobacco Use: Former    Smokeless Tobacco Use: Never    Passive Exposure: Not on Actuary Strain: Low Risk (02/07/2023)   Overall Financial Resource Strain (CARDIA)    Difficulty of Paying Living Expenses: Not hard at all  Food Insecurity: No Food Insecurity (02/07/2023)   Hunger Vital Sign    Worried About Running Out of Food in the Last Year: Never true    Ran Out of Food in the Last Year: Never true  Transportation Needs: No Transportation Needs (02/07/2023)   PRAPARE - Administrator, Civil Service (Medical): No    Lack of Transportation (Non-Medical): No  Physical Activity: Sufficiently Active (02/07/2023)   Exercise Vital Sign    Days of Exercise per Week: 5 days    Minutes of Exercise per Session: 60 min  Stress: No Stress Concern Present (02/07/2023)   [employer] of Occupational Health - Occupational Stress Questionnaire    Feeling of Stress : Not at all  Social Connections: Moderately Isolated (02/07/2023)   Social Connection and Isolation Panel    Frequency of Communication with Friends and Family: More than three times a week    Frequency of Social Gatherings with Friends and Family: More than three times a week    Attends Religious Services: Never    Database Administrator or Organizations: No    Attends Banker Meetings: Never    Marital Status: Married  Catering Manager Violence: Not At Risk (02/07/2023)   Humiliation, Afraid, Rape, and Kick questionnaire    Fear of Current or Ex-Partner: No    Emotionally Abused: No    Physically Abused: No    Sexually Abused: No  Depression (PHQ2-9): Low Risk (12/26/2024)   Depression (PHQ2-9)    PHQ-2 Score: 0  Alcohol Screen: Low Risk (02/07/2023)   Alcohol Screen    Last Alcohol Screening Score (AUDIT): 3  Housing: Low Risk (02/07/2023)   Housing    Last Housing Risk Score: 0  Utilities: Not At Risk (02/07/2023)   [facility] Utilities    Threatened with loss of utilities: No  Health  Literacy: Not on file    Past Surgical History:  Procedure Laterality Date   MELANOMA EXCISION WITH SENTINEL LYMPH NODE BIOPSY Left 02/08/2018   Procedure: WIDE EXCISION OF LEFT CHEST MELANOMA AND LEFT AXILLARY SENTINEL LYMPH NODE;  Surgeon: Sebastian Moles, MD;  Location: [facility] OR;  Service: General;  Laterality: Left;   WISDOM TOOTH EXTRACTION      Family History  Problem Relation Age of Onset   Hypertension Mother    Stroke Mother    Hyperlipidemia Mother    Heart disease Mother    Hypertension Father    Heart attack Father    Heart disease Father    Hyperlipidemia Father    Cancer Brother 32       Glioblastoma     Allergies[1]  Medications Ordered Prior to Encounter[2]  BP (!) 180/120  Pulse 86   Temp 97.6 F (36.4 C) (Oral)   Ht 5' 9 (1.753 m)   Wt 174 lb (78.9 kg)   SpO2 99%   BMI 25.70 kg/m       Objective:   Physical Exam Vitals and nursing note reviewed.  Constitutional:      General: He is not in acute distress.    Appearance: Normal appearance. He is not ill-appearing.  HENT:     Head: Normocephalic and atraumatic.     Right Ear: Tympanic membrane, ear canal and external ear normal. There is no impacted cerumen.     Left Ear: Tympanic membrane, ear canal and external ear normal. There is no impacted cerumen.     Nose: Nose normal. No congestion or rhinorrhea.     Mouth/Throat:     Mouth: Mucous membranes are moist.     Pharynx: Oropharynx is clear.  Eyes:     Extraocular Movements: Extraocular movements intact.     Conjunctiva/sclera: Conjunctivae normal.     Pupils: Pupils are equal, round, and reactive to light.  Neck:     Vascular: No carotid bruit.  Cardiovascular:     Rate and Rhythm: Normal rate and regular rhythm.     Pulses: Normal pulses.     Heart sounds: No murmur heard.    No friction rub. No gallop.  Pulmonary:     Effort: Pulmonary effort is normal.     Breath sounds: Normal breath sounds.  Abdominal:     General: Abdomen is  flat. Bowel sounds are normal. There is no distension.     Palpations: Abdomen is soft. There is no mass.     Tenderness: There is no abdominal tenderness. There is no guarding or rebound.     Hernia: No hernia is present.  Musculoskeletal:        General: Normal range of motion.     Cervical back: Normal range of motion and neck supple.  Lymphadenopathy:     Cervical: No cervical adenopathy.  Skin:    General: Skin is warm and dry.     Capillary Refill: Capillary refill takes less than 2 seconds.  Neurological:     General: No focal deficit present.     Mental Status: He is alert and oriented to person, place, and time.     Cranial Nerves: No facial asymmetry.     Sensory: Sensation is intact.     Motor: Motor function is intact.     Coordination: Coordination is intact.     Gait: Gait is intact.     Comments: +aphasia,  Psychiatric:        Mood and Affect: Mood normal.        Behavior: Behavior normal.        Thought Content: Thought content normal.        Judgment: Judgment normal.         Assessment & Plan:  1. Routine general medical examination at a health care facility (Primary) Today patient counseled on age appropriate routine health concerns for screening and prevention, each reviewed and up to date or declined. Immunizations reviewed and up to date or declined. Labs ordered and reviewed. Risk factors for depression reviewed and negative. Hearing function and visual acuity are intact. ADLs screened and addressed as needed. Functional ability and level of safety reviewed and appropriate. Education, counseling and referrals performed based on assessed risks today. Patient provided with a copy of personalized plan for preventive services.   2.  Essential hypertension - Better controlled at home. No change in therapy  - Lipid panel; Future - CBC; Future - Comprehensive metabolic panel with GFR; Future - amLODipine  (NORVASC ) 5 MG tablet; Take 1 tablet (5 mg total) by  mouth daily. TAKE 1 TABLET (5 MG TOTAL) BY MOUTH DAILY.  Dispense: 90 tablet; Refill: 3 - losartan  (COZAAR ) 25 MG tablet; Take 1 tablet (25 mg total) by mouth daily.  Dispense: 90 tablet; Refill: 3  3. Mixed hyperlipidemia - Likely need to start statin.  - Lipid panel; Future - CBC; Future - Comprehensive metabolic panel with GFR; Future  4. Primary progressive aphasia - Stable. Follow up with neurology as directed - Lipid panel; Future - CBC; Future - Comprehensive metabolic panel with GFR; Future  5. Cognitive disorder - Continue Aricept  and Namenda   - Lipid panel; Future - CBC; Future - Comprehensive metabolic panel with GFR; Future  6. Prostate cancer screening  - PSA; Future  [name] Shape, NP     [1]  Allergies Allergen Reactions   Lisinopril  Cough  [2]  Current Outpatient Medications on File Prior to Visit  Medication Sig Dispense Refill   amLODipine  (NORVASC ) 5 MG tablet TAKE 1 TABLET (5 MG TOTAL) BY MOUTH DAILY. SCHEDULE APPT FOR ANY FUTURE REFILLSTAKE 1 TABLET (5 MG TOTAL) BY MOUTH DAILY. 30 tablet 0   donepezil  (ARICEPT ) 10 MG tablet TAKE 1 TABLET BY MOUTH EVERY DAY AT NIGHT 90 tablet 3   fexofenadine (ALLEGRA ODT) 30 MG disintegrating tablet Take 30 mg by mouth daily.     losartan  (COZAAR ) 25 MG tablet TAKE 1 TABLET (25 MG TOTAL) BY MOUTH DAILY. 30 tablet 0   memantine  (NAMENDA ) 10 MG tablet TAKE 1 TABLET BY MOUTH TWICE A DAY 60 tablet 6   naproxen sodium (ALEVE) 220 MG tablet Take 220 mg by mouth as needed.     No current facility-administered medications on file prior to visit.   "

## 2024-12-26 NOTE — Patient Instructions (Signed)
 It was great seeing you today   We will follow up with you regarding your lab work   Please let me know if you need anything

## 2025-04-07 ENCOUNTER — Ambulatory Visit: Admitting: Physician Assistant
